# Patient Record
Sex: Female | Born: 1945 | Race: White | Hispanic: No | Marital: Married | Smoking: Never smoker
Health system: Southern US, Community
[De-identification: ages and names within clinical notes are randomized; demographics above are authoritative.]

## PROBLEM LIST (undated history)

## (undated) DIAGNOSIS — M1990 Unspecified osteoarthritis, unspecified site: Secondary | ICD-10-CM

## (undated) DIAGNOSIS — L6612 Frontal fibrosing alopecia: Secondary | ICD-10-CM

## (undated) DIAGNOSIS — Z9889 Other specified postprocedural states: Secondary | ICD-10-CM

## (undated) DIAGNOSIS — L4050 Arthropathic psoriasis, unspecified: Secondary | ICD-10-CM

## (undated) DIAGNOSIS — A048 Other specified bacterial intestinal infections: Secondary | ICD-10-CM

## (undated) DIAGNOSIS — M3500 Sicca syndrome, unspecified: Secondary | ICD-10-CM

## (undated) DIAGNOSIS — K219 Gastro-esophageal reflux disease without esophagitis: Secondary | ICD-10-CM

## (undated) DIAGNOSIS — N3010 Interstitial cystitis (chronic) without hematuria: Secondary | ICD-10-CM

## (undated) DIAGNOSIS — T7840XA Allergy, unspecified, initial encounter: Secondary | ICD-10-CM

## (undated) DIAGNOSIS — K5900 Constipation, unspecified: Secondary | ICD-10-CM

## (undated) DIAGNOSIS — R768 Other specified abnormal immunological findings in serum: Secondary | ICD-10-CM

## (undated) DIAGNOSIS — T8859XA Other complications of anesthesia, initial encounter: Secondary | ICD-10-CM

## (undated) DIAGNOSIS — K449 Diaphragmatic hernia without obstruction or gangrene: Secondary | ICD-10-CM

## (undated) DIAGNOSIS — T4145XA Adverse effect of unspecified anesthetic, initial encounter: Secondary | ICD-10-CM

## (undated) DIAGNOSIS — L409 Psoriasis, unspecified: Secondary | ICD-10-CM

## (undated) DIAGNOSIS — L661 Lichen planopilaris: Secondary | ICD-10-CM

## (undated) DIAGNOSIS — M797 Fibromyalgia: Secondary | ICD-10-CM

## (undated) DIAGNOSIS — R011 Cardiac murmur, unspecified: Secondary | ICD-10-CM

## (undated) DIAGNOSIS — R112 Nausea with vomiting, unspecified: Secondary | ICD-10-CM

## (undated) HISTORY — DX: Other specified bacterial intestinal infections: A04.8

## (undated) HISTORY — DX: Lichen planopilaris: L66.1

## (undated) HISTORY — DX: Unspecified osteoarthritis, unspecified site: M19.90

## (undated) HISTORY — PX: BLADDER SURGERY: SHX569

## (undated) HISTORY — DX: Fibromyalgia: M79.7

## (undated) HISTORY — PX: TUBAL LIGATION: SHX77

## (undated) HISTORY — DX: Allergy, unspecified, initial encounter: T78.40XA

## (undated) HISTORY — DX: Sjogren syndrome, unspecified: M35.00

## (undated) HISTORY — PX: OTHER SURGICAL HISTORY: SHX169

## (undated) HISTORY — DX: Gastro-esophageal reflux disease without esophagitis: K21.9

## (undated) HISTORY — DX: Frontal fibrosing alopecia: L66.12

## (undated) HISTORY — DX: Interstitial cystitis (chronic) without hematuria: N30.10

## (undated) HISTORY — DX: Other specified abnormal immunological findings in serum: R76.8

## (undated) HISTORY — DX: Diaphragmatic hernia without obstruction or gangrene: K44.9

---

## 1982-11-05 HISTORY — PX: OTHER SURGICAL HISTORY: SHX169

## 1993-11-05 HISTORY — PX: CHOLECYSTECTOMY: SHX55

## 1999-05-24 ENCOUNTER — Other Ambulatory Visit: Admission: RE | Admit: 1999-05-24 | Discharge: 1999-05-24 | Payer: Self-pay | Admitting: *Deleted

## 1999-11-03 ENCOUNTER — Other Ambulatory Visit: Admission: RE | Admit: 1999-11-03 | Discharge: 1999-11-03 | Payer: Self-pay | Admitting: *Deleted

## 1999-11-03 ENCOUNTER — Encounter (INDEPENDENT_AMBULATORY_CARE_PROVIDER_SITE_OTHER): Payer: Self-pay | Admitting: Specialist

## 2000-05-15 ENCOUNTER — Other Ambulatory Visit: Admission: RE | Admit: 2000-05-15 | Discharge: 2000-05-15 | Payer: Self-pay | Admitting: *Deleted

## 2000-09-02 ENCOUNTER — Ambulatory Visit (HOSPITAL_COMMUNITY): Admission: RE | Admit: 2000-09-02 | Discharge: 2000-09-02 | Payer: Self-pay | Admitting: *Deleted

## 2001-04-15 ENCOUNTER — Other Ambulatory Visit: Admission: RE | Admit: 2001-04-15 | Discharge: 2001-04-15 | Payer: Self-pay | Admitting: *Deleted

## 2002-05-25 ENCOUNTER — Other Ambulatory Visit: Admission: RE | Admit: 2002-05-25 | Discharge: 2002-05-25 | Payer: Self-pay | Admitting: *Deleted

## 2002-06-30 ENCOUNTER — Encounter: Payer: Self-pay | Admitting: Otolaryngology

## 2002-06-30 ENCOUNTER — Encounter: Admission: RE | Admit: 2002-06-30 | Discharge: 2002-06-30 | Payer: Self-pay | Admitting: Otolaryngology

## 2003-06-23 ENCOUNTER — Other Ambulatory Visit: Admission: RE | Admit: 2003-06-23 | Discharge: 2003-06-23 | Payer: Self-pay | Admitting: *Deleted

## 2003-07-29 ENCOUNTER — Ambulatory Visit (HOSPITAL_COMMUNITY): Admission: RE | Admit: 2003-07-29 | Discharge: 2003-07-29 | Payer: Self-pay | Admitting: Urology

## 2003-07-29 ENCOUNTER — Ambulatory Visit (HOSPITAL_BASED_OUTPATIENT_CLINIC_OR_DEPARTMENT_OTHER): Admission: RE | Admit: 2003-07-29 | Discharge: 2003-07-29 | Payer: Self-pay | Admitting: Urology

## 2003-07-29 ENCOUNTER — Encounter (INDEPENDENT_AMBULATORY_CARE_PROVIDER_SITE_OTHER): Payer: Self-pay | Admitting: Specialist

## 2003-11-06 ENCOUNTER — Encounter (INDEPENDENT_AMBULATORY_CARE_PROVIDER_SITE_OTHER): Payer: Self-pay | Admitting: *Deleted

## 2003-12-27 ENCOUNTER — Encounter: Admission: RE | Admit: 2003-12-27 | Discharge: 2003-12-27 | Payer: Self-pay | Admitting: Internal Medicine

## 2004-08-07 ENCOUNTER — Other Ambulatory Visit: Admission: RE | Admit: 2004-08-07 | Discharge: 2004-08-07 | Payer: Self-pay | Admitting: *Deleted

## 2004-12-07 ENCOUNTER — Ambulatory Visit: Payer: Self-pay | Admitting: Internal Medicine

## 2005-06-30 ENCOUNTER — Encounter: Admission: RE | Admit: 2005-06-30 | Discharge: 2005-06-30 | Payer: Self-pay | Admitting: Rheumatology

## 2005-08-20 ENCOUNTER — Other Ambulatory Visit: Admission: RE | Admit: 2005-08-20 | Discharge: 2005-08-20 | Payer: Self-pay | Admitting: *Deleted

## 2005-10-25 ENCOUNTER — Ambulatory Visit (HOSPITAL_COMMUNITY): Admission: RE | Admit: 2005-10-25 | Discharge: 2005-10-25 | Payer: Self-pay | Admitting: Urology

## 2005-10-25 ENCOUNTER — Ambulatory Visit (HOSPITAL_BASED_OUTPATIENT_CLINIC_OR_DEPARTMENT_OTHER): Admission: RE | Admit: 2005-10-25 | Discharge: 2005-10-25 | Payer: Self-pay | Admitting: Urology

## 2006-04-11 ENCOUNTER — Encounter: Admission: RE | Admit: 2006-04-11 | Discharge: 2006-04-11 | Payer: Self-pay | Admitting: Orthopedic Surgery

## 2006-10-17 ENCOUNTER — Other Ambulatory Visit: Admission: RE | Admit: 2006-10-17 | Discharge: 2006-10-17 | Payer: Self-pay | Admitting: *Deleted

## 2007-02-18 ENCOUNTER — Ambulatory Visit: Payer: Self-pay | Admitting: Internal Medicine

## 2007-02-24 ENCOUNTER — Ambulatory Visit: Payer: Self-pay | Admitting: Internal Medicine

## 2007-02-24 LAB — CONVERTED CEMR LAB
ALT: 29 units/L (ref 0–40)
AST: 25 units/L (ref 0–37)
BUN: 12 mg/dL (ref 6–23)
Basophils Absolute: 0.1 10*3/uL (ref 0.0–0.1)
Basophils Relative: 0.8 % (ref 0.0–1.0)
Creatinine, Ser: 0.9 mg/dL (ref 0.4–1.2)
Eosinophils Absolute: 0.4 10*3/uL (ref 0.0–0.6)
Eosinophils Relative: 6.1 % — ABNORMAL HIGH (ref 0.0–5.0)
HCT: 37.4 % (ref 36.0–46.0)
Hemoglobin: 12.9 g/dL (ref 12.0–15.0)
Hgb A1c MFr Bld: 5.3 % (ref 4.6–6.0)
Lymphocytes Relative: 24.6 % (ref 12.0–46.0)
MCHC: 34.5 g/dL (ref 30.0–36.0)
MCV: 92 fL (ref 78.0–100.0)
Monocytes Absolute: 0.3 10*3/uL (ref 0.2–0.7)
Monocytes Relative: 4.7 % (ref 3.0–11.0)
Neutro Abs: 4 10*3/uL (ref 1.4–7.7)
Neutrophils Relative %: 63.8 % (ref 43.0–77.0)
Platelets: 223 10*3/uL (ref 150–400)
RBC: 4.07 M/uL (ref 3.87–5.11)
RDW: 12.5 % (ref 11.5–14.6)
TSH: 1.36 microintl units/mL (ref 0.35–5.50)
WBC: 6.4 10*3/uL (ref 4.5–10.5)

## 2007-02-25 ENCOUNTER — Encounter: Payer: Self-pay | Admitting: Internal Medicine

## 2007-03-12 ENCOUNTER — Ambulatory Visit: Payer: Self-pay | Admitting: Internal Medicine

## 2007-03-12 ENCOUNTER — Encounter: Payer: Self-pay | Admitting: Internal Medicine

## 2007-04-18 ENCOUNTER — Encounter: Payer: Self-pay | Admitting: Internal Medicine

## 2007-06-23 ENCOUNTER — Encounter: Payer: Self-pay | Admitting: Internal Medicine

## 2007-06-24 ENCOUNTER — Encounter: Payer: Self-pay | Admitting: Internal Medicine

## 2007-10-10 ENCOUNTER — Ambulatory Visit: Payer: Self-pay | Admitting: Internal Medicine

## 2007-10-10 DIAGNOSIS — R635 Abnormal weight gain: Secondary | ICD-10-CM | POA: Insufficient documentation

## 2007-10-10 DIAGNOSIS — E039 Hypothyroidism, unspecified: Secondary | ICD-10-CM | POA: Insufficient documentation

## 2007-10-10 DIAGNOSIS — M2550 Pain in unspecified joint: Secondary | ICD-10-CM | POA: Insufficient documentation

## 2007-10-14 ENCOUNTER — Encounter (INDEPENDENT_AMBULATORY_CARE_PROVIDER_SITE_OTHER): Payer: Self-pay | Admitting: *Deleted

## 2007-10-14 DIAGNOSIS — J309 Allergic rhinitis, unspecified: Secondary | ICD-10-CM | POA: Insufficient documentation

## 2007-10-14 DIAGNOSIS — K449 Diaphragmatic hernia without obstruction or gangrene: Secondary | ICD-10-CM | POA: Insufficient documentation

## 2007-10-14 DIAGNOSIS — R799 Abnormal finding of blood chemistry, unspecified: Secondary | ICD-10-CM | POA: Insufficient documentation

## 2007-10-14 DIAGNOSIS — A048 Other specified bacterial intestinal infections: Secondary | ICD-10-CM | POA: Insufficient documentation

## 2007-10-15 LAB — CONVERTED CEMR LAB
Rheumatoid fact SerPl-aCnc: <20 intl units/mL (ref 0–20)
Sed Rate: 6 mm/hr (ref 0–22)
TSH: 1.711 microintl units/mL (ref 0.350–5.50)
Total CK: 77 units/L (ref 7–177)
Uric Acid, Serum: 3.9 mg/dL (ref 2.4–7.0)
Vit D, 1,25-Dihydroxy: 39 (ref 30–89)

## 2007-10-23 ENCOUNTER — Telehealth: Payer: Self-pay | Admitting: Internal Medicine

## 2007-10-27 ENCOUNTER — Telehealth (INDEPENDENT_AMBULATORY_CARE_PROVIDER_SITE_OTHER): Payer: Self-pay | Admitting: *Deleted

## 2007-11-24 ENCOUNTER — Emergency Department (HOSPITAL_COMMUNITY): Admission: EM | Admit: 2007-11-24 | Discharge: 2007-11-24 | Payer: Self-pay | Admitting: Emergency Medicine

## 2007-12-03 ENCOUNTER — Other Ambulatory Visit: Admission: RE | Admit: 2007-12-03 | Discharge: 2007-12-03 | Payer: Self-pay | Admitting: *Deleted

## 2008-01-06 ENCOUNTER — Encounter: Payer: Self-pay | Admitting: Internal Medicine

## 2008-02-03 ENCOUNTER — Encounter: Payer: Self-pay | Admitting: Internal Medicine

## 2008-03-16 ENCOUNTER — Telehealth (INDEPENDENT_AMBULATORY_CARE_PROVIDER_SITE_OTHER): Payer: Self-pay | Admitting: *Deleted

## 2008-04-30 ENCOUNTER — Encounter: Payer: Self-pay | Admitting: Internal Medicine

## 2008-06-23 ENCOUNTER — Encounter: Payer: Self-pay | Admitting: Internal Medicine

## 2008-09-04 ENCOUNTER — Ambulatory Visit: Payer: Self-pay | Admitting: Family Medicine

## 2008-09-04 DIAGNOSIS — G43909 Migraine, unspecified, not intractable, without status migrainosus: Secondary | ICD-10-CM | POA: Insufficient documentation

## 2008-09-21 ENCOUNTER — Ambulatory Visit: Payer: Self-pay | Admitting: Internal Medicine

## 2008-10-11 ENCOUNTER — Encounter: Payer: Self-pay | Admitting: Internal Medicine

## 2008-11-23 ENCOUNTER — Encounter: Payer: Self-pay | Admitting: Internal Medicine

## 2008-12-09 ENCOUNTER — Other Ambulatory Visit: Admission: RE | Admit: 2008-12-09 | Discharge: 2008-12-09 | Payer: Self-pay | Admitting: Obstetrics and Gynecology

## 2009-01-11 ENCOUNTER — Encounter: Payer: Self-pay | Admitting: Internal Medicine

## 2009-01-24 ENCOUNTER — Ambulatory Visit: Payer: Self-pay | Admitting: Internal Medicine

## 2009-01-24 DIAGNOSIS — F32A Depression, unspecified: Secondary | ICD-10-CM | POA: Insufficient documentation

## 2009-01-24 DIAGNOSIS — F329 Major depressive disorder, single episode, unspecified: Secondary | ICD-10-CM

## 2009-02-01 ENCOUNTER — Encounter (INDEPENDENT_AMBULATORY_CARE_PROVIDER_SITE_OTHER): Payer: Self-pay | Admitting: *Deleted

## 2009-02-01 LAB — CONVERTED CEMR LAB: TSH: 0.94 microintl units/mL (ref 0.35–5.50)

## 2009-03-30 ENCOUNTER — Encounter: Payer: Self-pay | Admitting: Internal Medicine

## 2009-07-22 ENCOUNTER — Telehealth: Payer: Self-pay | Admitting: Internal Medicine

## 2009-07-25 ENCOUNTER — Ambulatory Visit: Payer: Self-pay | Admitting: Internal Medicine

## 2009-07-25 LAB — CONVERTED CEMR LAB
Cholesterol: 183 mg/dL (ref 0–200)
HDL: 52.9 mg/dL (ref >39.00)
TSH: 1.21 microintl units/mL (ref 0.35–5.50)
Triglycerides: 88 mg/dL (ref 0.0–149.0)
VLDL: 17.6 mg/dL (ref 0.0–40.0)

## 2009-08-04 ENCOUNTER — Telehealth (INDEPENDENT_AMBULATORY_CARE_PROVIDER_SITE_OTHER): Payer: Self-pay | Admitting: *Deleted

## 2009-08-16 ENCOUNTER — Telehealth (INDEPENDENT_AMBULATORY_CARE_PROVIDER_SITE_OTHER): Payer: Self-pay | Admitting: *Deleted

## 2009-09-01 ENCOUNTER — Telehealth: Payer: Self-pay | Admitting: Internal Medicine

## 2009-12-12 ENCOUNTER — Other Ambulatory Visit: Admission: RE | Admit: 2009-12-12 | Discharge: 2009-12-12 | Payer: Self-pay | Admitting: Obstetrics and Gynecology

## 2010-01-09 ENCOUNTER — Encounter: Payer: Self-pay | Admitting: Internal Medicine

## 2010-01-12 ENCOUNTER — Encounter: Payer: Self-pay | Admitting: Internal Medicine

## 2010-01-25 ENCOUNTER — Telehealth (INDEPENDENT_AMBULATORY_CARE_PROVIDER_SITE_OTHER): Payer: Self-pay | Admitting: *Deleted

## 2010-01-26 ENCOUNTER — Ambulatory Visit: Payer: Self-pay | Admitting: Internal Medicine

## 2010-01-26 LAB — CONVERTED CEMR LAB
HDL: 43.2 mg/dL (ref >39.00)
LDL Cholesterol: 101 mg/dL — ABNORMAL HIGH (ref 0–99)
Total CHOL/HDL Ratio: 4
Triglycerides: 120 mg/dL (ref 0.0–149.0)
VLDL: 24 mg/dL (ref 0.0–40.0)

## 2010-01-31 ENCOUNTER — Ambulatory Visit: Payer: Self-pay | Admitting: Internal Medicine

## 2010-01-31 DIAGNOSIS — R350 Frequency of micturition: Secondary | ICD-10-CM | POA: Insufficient documentation

## 2010-01-31 DIAGNOSIS — N3010 Interstitial cystitis (chronic) without hematuria: Secondary | ICD-10-CM | POA: Insufficient documentation

## 2010-01-31 DIAGNOSIS — R0609 Other forms of dyspnea: Secondary | ICD-10-CM | POA: Insufficient documentation

## 2010-01-31 DIAGNOSIS — R5381 Other malaise: Secondary | ICD-10-CM | POA: Insufficient documentation

## 2010-01-31 DIAGNOSIS — R5383 Other fatigue: Secondary | ICD-10-CM | POA: Insufficient documentation

## 2010-01-31 DIAGNOSIS — M797 Fibromyalgia: Secondary | ICD-10-CM | POA: Insufficient documentation

## 2010-01-31 DIAGNOSIS — R0989 Other specified symptoms and signs involving the circulatory and respiratory systems: Secondary | ICD-10-CM | POA: Insufficient documentation

## 2010-02-01 LAB — CONVERTED CEMR LAB
Alkaline Phosphatase: 76 units/L (ref 39–117)
Basophils Relative: 0.8 % (ref 0.0–3.0)
Bilirubin, Direct: 0 mg/dL (ref 0.0–0.3)
CO2: 29 meq/L (ref 19–32)
Calcium: 9.3 mg/dL (ref 8.4–10.5)
Creatinine, Ser: 0.8 mg/dL (ref 0.4–1.2)
Eosinophils Absolute: 0.5 10*3/uL (ref 0.0–0.7)
GFR calc non Af Amer: 76.92 mL/min (ref >60)
Lymphocytes Relative: 21.2 % (ref 12.0–46.0)
MCHC: 33.3 g/dL (ref 30.0–36.0)
Monocytes Relative: 6.7 % (ref 3.0–12.0)
Neutrophils Relative %: 63 % (ref 43.0–77.0)
RBC: 4.25 M/uL (ref 3.87–5.11)
Total Protein: 6.7 g/dL (ref 6.0–8.3)
WBC: 6 10*3/uL (ref 4.5–10.5)

## 2010-06-13 ENCOUNTER — Encounter: Payer: Self-pay | Admitting: Internal Medicine

## 2010-10-16 ENCOUNTER — Telehealth (INDEPENDENT_AMBULATORY_CARE_PROVIDER_SITE_OTHER): Payer: Self-pay | Admitting: *Deleted

## 2010-10-18 ENCOUNTER — Ambulatory Visit: Payer: Self-pay | Admitting: Internal Medicine

## 2010-10-20 LAB — CONVERTED CEMR LAB
ALT: 36 units/L — ABNORMAL HIGH (ref 0–35)
AST: 25 units/L (ref 0–37)
Bilirubin, Direct: 0.1 mg/dL (ref 0.0–0.3)
Total Bilirubin: 0.4 mg/dL (ref 0.3–1.2)
Total Protein: 6 g/dL (ref 6.0–8.3)

## 2010-11-14 ENCOUNTER — Encounter: Payer: Self-pay | Admitting: Internal Medicine

## 2010-12-07 NOTE — Letter (Signed)
Summary: [facility]  [facility]   Imported By: Lanelle Bal 06/22/2010 10:51:01  _____________________________________________________________________  External Attachment:    Type:   Image     Comment:   External Document

## 2010-12-07 NOTE — Letter (Signed)
Summary: [facility]  [facility]   Imported By: Lanelle Bal 11/23/2010 13:17:04  _____________________________________________________________________  External Attachment:    Type:   Image     Comment:   External Document

## 2010-12-07 NOTE — Letter (Signed)
Summary: [facility]  [facility]   Imported By: Lanelle Bal 01/23/2010 11:42:57  _____________________________________________________________________  External Attachment:    Type:   Image     Comment:   External Document

## 2010-12-07 NOTE — Progress Notes (Signed)
Summary:  med increase  Phone Note Call from Patient Call back at Work Phone [phone]   Caller: Patient Summary of Call: pt left VM that she feels that her thyroid med needs to be increase. pt c/o fatigue, hair loss, dry skin, and unable to loss weight............Marland KitchenFelecia Deloach CMA  January 25, 2010 12:50 PM   left message to call office. per last labs and phone note pt due for OV to review labs and rx meds for a year. pt never f/u with appt or has any pending appt schedule. pt needs to have OV and labs (TSH) repeated.............Marland KitchenFelecia Deloach CMA  January 25, 2010 12:54 PM   Follow-up for Phone Call        pt has labs and ov schedule.............Marland KitchenFelecia Deloach CMA  January 25, 2010 4:17 PM

## 2010-12-07 NOTE — Letter (Signed)
Summary: [facility]  [facility]   Imported By: Lanelle Bal 01/24/2010 13:04:12  _____________________________________________________________________  External Attachment:    Type:   Image     Comment:   External Document

## 2010-12-07 NOTE — Assessment & Plan Note (Signed)
Summary: f/u labs, discuss increase med//fd   Vital Signs:  Patient profile:   65 year old female Weight:      176.2 pounds BMI:     29.89 Pulse rate:   80 / minute Resp:     16 per minute BP sitting:   124 / 78  (left arm) Cuff size:   large  Vitals Entered By: Shonna Chock (January 31, 2010 8:08 AM) CC: Follow-up visit: Discuss labs (copy given), Fatigue Comments REVIEWED MED LIST, PATIENT AGREED DOSE AND INSTRUCTION CORRECT    Primary Care Provider:  Alwyn Ren  CC:  Follow-up visit: Discuss labs (copy given) and Fatigue.  History of Present Illness: In Weight Watchers & exercising 6X /week (CURVES & walking) with minimal weight loss.The patient reports persistent fatigue over 12-18 months,a  fatigue without physical limitations, and primarily physical fatigue.  The patient also reports dyspnea in CURVES with heart rate of to 130.  The patient denies fever, night sweats, exertional chest pain, cough, hemoptysis, and new medications.  Other symptoms include skin changes as dryness & hair loss.  The patient denies the following symptoms: leg swelling, orthopnea, PND, melena, adenopathy, severe snoring, and daytime sleepiness.  Depressive symptoms include anhedonia and feeling depressed.  The patient denies altered appetite and poor sleep.  She has Fibromyalgia & diffuse arthralgias treated by Dr Corliss Skains. These prompted her to retire. She previously took Cymbalta but she couln't urinate. Labs reviewed : TSH therapeutic & stable over 3 years.Dr Thomasena Edis checked vitamin D level.  Preventive Screening-Counseling & Management  Alcohol-Tobacco     Smoking Status: never  Allergies: 1)  ! Pcn 2)  ! * Emycin 3)  ! Keflex 4)  ! 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), S/P treatment ANA POSITIVE (ICD-790.99) HIATAL HERNIA (ICD-553.3) ALLERGIC RHINITIS CAUSE UNSPECIFIED (ICD-477.9) FIBROMYALGIA (ICD-729.1) HYPOTHYROIDISM  (ICD-244.9) WEIGHT GAIN (ICD-783.1) PAIN IN JOINT, SITE UNSPECIFIED (ICD-719.40) Interstitial Cystitis  Past Surgical History: Bladder surgery x2 for scar tissue cysts on ovaries x2 Polyp removed from uterus Early Menopause @ 41 G(vi) P(iii) Mis(iii) Colonscopy 1610,9604 negative(FH colon CA) Endoscopy 2001--HH,ERD  Family History: Father: DM, MI,  Alcoholism, died at age 71 Mother: colon cancer, Alzheimers,  depression, hypothyroidism Sibling:none Maternal Uncles: DM, alcoholism Maternal GM-HTN, CVA Maternal FH:-depression Son: drug overdose  Social History: Retired Never Smoked Alcohol use-yes: rarely  Review of Systems Eyes:  Denies blurring, double vision, and vision loss-both eyes. ENT:  Denies difficulty swallowing and hoarseness. CV:  Denies palpitations. GI:  Complains of constipation; denies diarrhea. GU:  Complains of urinary frequency; Dr Marcello Fennel treating IC. Derm:  Complains of changes in nail beds; Nails brittle. Neuro:  Complains of numbness; denies tingling; RUE CTS treated with brace. [place] tremor RUE. Psych:  Complains of depression; denies anxiety, easily angered, easily tearful, and irritability; Wellbutrin ? effective. Endo:  Complains of cold intolerance; denies excessive hunger and excessive thirst.  Physical Exam  General:  well-nourished; alert,appropriate and cooperative throughout examination Neck:  No deformities, masses, or tenderness noted. Lungs:  Normal respiratory effort, chest expands symmetrically. Lungs are clear to auscultation, no crackles or wheezes. Heart:  normal rate, regular rhythm, no gallop, no rub, no JVD, no HJR, S4 with  grade 1 /6 systolic murmur.     Impression & Recommendations:  Problem # 1:  FATIGUE (ICD-780.79)  Orders: Venipuncture (54098) TLB-BMP (Basic Metabolic Panel-BMET) (80048-METABOL) TLB-CBC Platelet - w/Differential (85025-CBCD) TLB-Hepatic/Liver Function Pnl (80076-HEPATIC) EKG w/ Interpretation  (  93000)  Problem # 2:  DYSPNEA/SHORTNESS OF BREATH (ICD-786.09)  Orders: EKG w/ Interpretation (93000)  Problem # 3:  FIBROMYALGIA (ICD-729.1)  Her updated medication list for this problem includes:    Naprelan 375 Mg Xr24h-tab (Naproxen sodium)    Flexeril 10 Mg Tabs (Cyclobenzaprine hcl) .Marland Kitchen... Take 1/2 -1 tab three times a day as needed only, "may affect balance , level of alertness"  Problem # 4:  INTERSTITIAL CYSTITIS (ICD-595.1)  Problem # 5:  FREQUENCY, URINARY (ICD-788.41)  Orders: Venipuncture (16109) TLB-A1C / Hgb A1C (Glycohemoglobin) (83036-A1C)  Problem # 6:  HYPOTHYROIDISM (ICD-244.9) TSH therapeutic Her updated medication list for this problem includes:    Synthroid 50 Mcg Tabs (Levothyroxine sodium) .Marland Kitchen... 1 by mouth qd  Complete Medication List: 1)  Synthroid 50 Mcg Tabs (Levothyroxine sodium) .Marland Kitchen.. 1 by mouth qd 2)  Nexium 40 Mg Cpdr (Esomeprazole magnesium) .Marland Kitchen.. 1 by mouth once daily 3)  Zyrtec Allergy 10 Mg Tabs (Cetirizine hcl) .Marland Kitchen.. 1 by mouth qd 4)  Asa  .... Prn 5)  Ibuprofen Tabs (ibuprofen)  .... Prn 6)  Benedyl  .... Prn 7)  Claritin  .... Prn 8)  Multivitamin  .... Prn 9)  Calcium  .... Prn 10)  Glucosamine and Chondriotin  .... Prn 11)  Fish Oil  .... Prn 12)  Naprelan 375 Mg Xr24h-tab (Naproxen sodium) 13)  Flexeril 10 Mg Tabs (Cyclobenzaprine hcl) .... Take 1/2 -1 tab three times a day as needed only, "may affect balance , level of alertness" 14)  Wellbutrin Xl 300 Mg Xr24h-tab (bupropion Hcl)  .... Take one tablet daily  Patient Instructions: 1)  If Wellbutrin XL 300 mg is not effective ; please discuss Savella with Dr Corliss Skains. Prescriptions: SYNTHROID 50 MCG  TABS (LEVOTHYROXINE SODIUM) 1 by mouth qd  #90 Tablet x 3   Entered and Authorized by:   Marga Melnick MD   Signed by:   Marga Melnick MD on 01/31/2010   Method used:   Print then Give to Patient   RxID:   [id number] WELLBUTRIN XL 300  MG XR24H-TAB (BUPROPION HCL) take one  tablet daily  #90 x 1   Entered and Authorized by:   Marga Melnick MD   Signed by:   Marga Melnick MD on 01/31/2010   Method used:   Print then Give to Patient   RxID:   [id number]

## 2010-12-07 NOTE — Progress Notes (Signed)
Summary: needs lab appt at [facility] lab   (lmom  12/12)  Phone Note Call from Patient Call back at Work Phone [phone]   Caller: Patient Summary of Call: patient called to set up labwork at [facility]----per results in March 2011, she needs just one repeat lab-------    Hepatic  790.4  she didnt answer phone when I called back, so I left message Initial call taken by: Jerolyn Shin,  October 16, 2010 2:56 PM  Follow-up for Phone Call        she called back--fasting lab was entered for Wed 12/14 at [facility] lab Follow-up by: Jerolyn Shin,  October 17, 2010 8:59 AM

## 2011-02-27 ENCOUNTER — Other Ambulatory Visit: Payer: Self-pay | Admitting: Internal Medicine

## 2011-02-27 NOTE — Telephone Encounter (Signed)
Patient needs cpx

## 2011-03-23 NOTE — Op Note (Signed)
NAME:  Susan Carpenter, Susan Carpenter                ACCOUNT NO.:  [id number]   MEDICAL RECORD NO.:  [id number]          PATIENT TYPE:  AMB   LOCATION:  [facility]                         FACILITY:  [facility]   PHYSICIAN:  Sigmund I. Patsi Sears, M.D.DATE OF BIRTH:  [birth year]   DATE OF PROCEDURE:  10/25/2005  DATE OF DISCHARGE:                                 OPERATIVE REPORT   PREOPERATIVE DIAGNOSIS:  Interstitial cystitis.   POSTOPERATIVE DIAGNOSIS:  Interstitial cystitis.   OPERATION:  1.  Cystourethroscopy.  2.  Hydrodistention in the bladder (900 mL).  3.  Instillation of Clorpactin for 3 minutes with irrigation.  4.  Installation of Pyridium, Marcaine in the bladder, injection of Marcaine      and Kenalog in the subtrigonal space.   SURGEON:  Dr. Patsi Sears   ANESTHESIA:  General LMA.   PREPARATION:  After appropriate preanesthesia, the patient is brought to the  operating room, placed on the operating table in the dorsal supine position  where general LMA anesthesia was introduced.  She was then re-placed in  dorsal lithotomy position where the pubis was prepped with Betadine solution  and draped in the usual fashion.  B&O suppository was given.   Cystourethroscopy was accomplished, and 900-950 mL and following this, the  bladder was drained of fluid.  Clorpactin instillation was accomplished for  3 minutes; then irrigation with saline was accomplished.  Following this,  Pyridium, Marcaine solution was instilled in the bladder and left in place.  Marcaine and Kenalog was injected at the bladder base.  The patient was  given IV Toradol, awakened, and taken to recovery room in good condition.      Sigmund I. Patsi Sears, M.D.  Electronically Signed     SIT/MEDQ  D:  10/25/2005  T:  10/29/2005  Job:  [id number]

## 2011-03-23 NOTE — Assessment & Plan Note (Signed)
[facility]                        [place] OFFICE NOTE   SILVERIA, BOTZ                       MRN:          [id number]  DATE:02/18/2007                            DOB:          08-13-1946    Genia Harold, date of birth February 10, 1946, was seen February 18, 2007, with complaints of fatigue and polydipsia present for at least  three months.  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  She  has seen the ophthalmologist at [facility], and it was  recommended that she consider having tear duct surgery.   Additionally, she describes cold intolerance requiring extra bedclothes  and covers.  There is chronic constipation, which she controls with  fiber.  She has chronic dry skin and hair changes.   She describes frequency of 12 to 15 times a day secondary to  interstitial cystitis.   She has lost 7 pounds in 10 weeks despite being on a meticulous Weight  Watcher Diet  and exercising at a high level 4 to 5 times per week.   She is able to exercise despite chronic inflammation of the second left  metatarsal for which she has ahd orthotic intervention.   Significant history includes colonoscopy in December of 2007, which was  negative.   Her mother did have colon cancer as well as diabetes and hypothyroidism.  Her father was diabetic and had a heart attack at 24.  Maternal uncles  have had diabetes.   She drinks socially but has never smoked.   PRESENT MEDICATIONS:  1. Elmiron 200 mg daily.  2. Synthroid 60 mcg daily.  3. Nexium 40 mg daily.  4. Wellbutrin XL 300.  5. Vivelle-Dot 0.05 mg daily.  6. Aygestin 5 mg every other month for 14 days.  7. Zyrtec 10 mg daily.  8. Relafen as needed.  9. She is on calcium, Glucosamine-Chondroitin combination,      multivitamins, and p.r.n. medicines.   She describes excess somnolence as well but has not had motor vehicle  accidents related to somnolence & does  not fall asleep during  conversations.  There is no history of sleep apnea.   Her weight is approximately the same as it was when last seen in August  2006 at 162 fully clothed.  Pulse was 82, heart rate 60, and blood  pressure 126/78.  Thyroid is normal to palpation.  The tongue is moist.  She has no lymphadenopathy  of neck or axilla.  She has no significant murmurs or gallops.  She has no organomegaly or  masses.  Pulses are intact.  Her hands do reveal significant degenerative joint changes with  osteophytes distally.  The skin is warm and dry with fair turgor.   Review of the chart reveals that she has had LDL in the range of 113 to  116 with otherwise normal lipid panels.   Because of the symptoms of fatigue, a CBC and differential and complete  TFTs would be appropriate.  Stool cards are not necessary as she had a  negative colonoscopy in September of 2007.   Because of the  history of polydipsia as well as the family history of  diabetes, an A1c would be appropriate.   An NMR LipoProfile would optimally evaluate the positive family  cardiovascular history & mild elevation of LDL.   I will ask her to consult the prevention.com web site for the [employer] Diet. I explained that unfortunately, when women go through  menopause, their metabolism does slow and  despite maintaining the same  caloric intake, they have profound difficulty losing weight.   I also would recommend that she consider reevaluation by a  rheumatologist because of the xerostomia and xerophthalmia symptoms she  has.  She has been followed by Dr. Santiago Bumpers, podiatrist.  In  January of this year, he performed uric acid, sed rate, ANA, rheumatoid  factor, and C-reactive protein, all of which were normal.  Significantly, she had a positive ANA in 1992.     Titus Dubin. Alwyn Ren, MD,FACP,FCCP  Electronically Signed    [facility]/MedQ  DD: 02/18/2007  DT: 02/18/2007  Job #: [id number]

## 2011-03-23 NOTE — Op Note (Signed)
   NAME:  Susan Carpenter, Susan Carpenter                          ACCOUNT NO.:  [id number]   MEDICAL RECORD NO.:  [id number]                   PATIENT TYPE:  AMB   LOCATION:  [facility]                                 FACILITY:  [facility]   PHYSICIAN:  Sigmund I. Patsi Sears, M.D.         DATE OF BIRTH:  [birth year]   DATE OF PROCEDURE:  07/29/2003  DATE OF DISCHARGE:                                 OPERATIVE REPORT   PREOPERATIVE DIAGNOSIS:  Interstitial cystitis.   POSTOPERATIVE DIAGNOSIS:  Interstitial cystitis.   OPERATION/PROCEDURE:  1. Cystourethroscopy.  2. Hydrodistention of the bladder.  3. Cold cup bladder biopsy.  4. Cauterization of biopsy sites.  5. Insertion of Marcaine and Pyridium.  6. Injection of Marcaine and Kenalog.   SURGEON:  Sigmund I. Patsi Sears, M.D.   ANESTHESIA:  LMA.   PREPARATION:  After the patient is brought to the operating room and placed  on the operating table in the dorsal supine position, general LMA anesthesia  was introduced.  She was then replaced in the dorsal lithotomy position  where the pubis was prepped with a Betadine solution and draped in the usual  fashion.   DESCRIPTION OF PROCEDURE:  Cystourethroscopy was accomplished and there was  no urethral stenosis noted.  Cystoscopy revealed normal appearing bladder  with no evidence of bladder stones, tumor, or diverticular formation.  Cold  cup bladder biopsies were taken and the areas cauterized.  The bladder was  previously hydrodistended to 950 mL.  Following this, photodocumentation was  accomplished of the patient's IC, and the bladder drained of fluid, and  Marcaine/Pyridium solution inserted in the bladder and Marcaine/Kenalog  solution injected into the bladder base.  The patient tolerated the  procedure well.  At the  beginning of the procedure, the patient had a B&O  suppository and at the end of the procedure, the patient had IV Toradol.  She was awakened and taken to the recovery room in good  condition.                                               Sigmund I. Patsi Sears, M.D.    SIT/MEDQ  D:  07/29/2003  T:  07/29/2003  Job:  [id number]

## 2011-06-18 ENCOUNTER — Other Ambulatory Visit: Payer: Self-pay | Admitting: Internal Medicine

## 2011-07-27 LAB — POCT I-STAT CREATININE
Creatinine, Ser: 1.1
Operator id: 198171

## 2011-07-27 LAB — URINALYSIS, ROUTINE W REFLEX MICROSCOPIC
Bilirubin Urine: NEGATIVE
Hgb urine dipstick: NEGATIVE
Ketones, ur: 15 — AB
Nitrite: NEGATIVE
Urobilinogen, UA: 0.2

## 2011-07-27 LAB — I-STAT 8, (EC8 V) (CONVERTED LAB)
Bicarbonate: 23.7
HCT: 49 — ABNORMAL HIGH
Hemoglobin: 16.7 — ABNORMAL HIGH
Operator id: 198171
Sodium: 139
TCO2: 25
pCO2, Ven: 36.6 — ABNORMAL LOW

## 2011-07-27 LAB — CBC
Hemoglobin: 15.3 — ABNORMAL HIGH
Platelets: 280
RDW: 13.2

## 2011-07-27 LAB — DIFFERENTIAL
Basophils Absolute: 0
Lymphocytes Relative: 3 — ABNORMAL LOW
Monocytes Absolute: 0.5
Neutro Abs: 18.5 — ABNORMAL HIGH
Neutrophils Relative %: 94 — ABNORMAL HIGH

## 2011-08-07 ENCOUNTER — Ambulatory Visit (INDEPENDENT_AMBULATORY_CARE_PROVIDER_SITE_OTHER): Payer: BC Managed Care – PPO | Admitting: Internal Medicine

## 2011-08-07 ENCOUNTER — Encounter: Payer: Self-pay | Admitting: Internal Medicine

## 2011-08-07 DIAGNOSIS — R5383 Other fatigue: Secondary | ICD-10-CM

## 2011-08-07 DIAGNOSIS — F329 Major depressive disorder, single episode, unspecified: Secondary | ICD-10-CM

## 2011-08-07 DIAGNOSIS — E039 Hypothyroidism, unspecified: Secondary | ICD-10-CM

## 2011-08-07 DIAGNOSIS — F3289 Other specified depressive episodes: Secondary | ICD-10-CM

## 2011-08-07 LAB — CBC WITH DIFFERENTIAL/PLATELET
Basophils Absolute: 0 10*3/uL (ref 0.0–0.1)
Eosinophils Relative: 6.8 % — ABNORMAL HIGH (ref 0.0–5.0)
HCT: 41.8 % (ref 36.0–46.0)
Hemoglobin: 13.3 g/dL (ref 12.0–15.0)
Lymphocytes Relative: 25 % (ref 12.0–46.0)
Lymphs Abs: 2 10*3/uL (ref 0.7–4.0)
Monocytes Relative: 5.1 % (ref 3.0–12.0)
Neutro Abs: 4.9 10*3/uL (ref 1.4–7.7)
RBC: 4.43 Mil/uL (ref 3.87–5.11)
RDW: 13.8 % (ref 11.5–14.6)
WBC: 7.9 10*3/uL (ref 4.5–10.5)

## 2011-08-07 LAB — TSH: TSH: 1.18 u[IU]/mL (ref 0.35–5.50)

## 2011-08-07 MED ORDER — L-METHYLFOLATE 7.5 MG PO TABS: 7.5000 mg | ORAL_TABLET | Freq: Every day | ORAL | Status: DC

## 2011-08-07 NOTE — Progress Notes (Signed)
Subjective:    Patient ID: Susan Carpenter, female    DOB: 1946-06-27, 65 y.o.   MRN: [id number]  HPI Depression: Onset:2002 Anxiety:no Loss of interest (Anhedonia):major issue; "I'm in blah land" Panic attacks:no Insomnia:usually no Anorexia:no, appetite increased Fatigue:always , but in context of Interstitial Cystitis, OA, & Fibromyalgia Neurologic signs/symptoms: Headache, numbness and tingling, weakness:no Family history of mental health issues, alcoholism or drug abuse:M, M aunt , M uncle  &MGM had depression Medications/efficacy:she questions efficacy of Wellbutrin.  She takes Cymbalta but had difficulty with urination. She also has chronic urinary issues with the interstitial cystitis. She has never taken Savella.  Thyroid function monitor : Medications status(change in dose/brand/mode of administration):no Constitutional: Weight change: up 15 # but lost slowly  with Weight  Watchers X 3 Visual change(blurred/diplopia/visual loss):no Hoarseness:no pewrsistently; Swallowing issues:no Cardiovascular: Palpitations:no; Racing:no; Irregularity:no GI: Constipation:some constipation; Diarrhea:no Derm: Change in nails/hair/skin:hair thinning Endo: Temperature intolerance: Heat:no; Cold:no      Review of Systems     Objective:   Physical Exam Gen.: Healthy and well-nourished in appearance. Alert, appropriate and cooperative throughout exam. Intermittently joking & laughing Head: Normocephalic without obvious abnormalities;  no alopecia but hair slightly  coarse Eyes: No corneal or conjunctival inflammation noted. Pupils equal round reactive to light and accommodation.  Extraocular motion intact. No lid lag Neck: No deformities, masses, or tenderness noted. Range of motion slightly decreased laterally. Thyroid normal. Lungs: Normal respiratory effort; chest expands symmetrically. Lungs are clear to auscultation without rales, wheezes, or increased work of breathing. Heart: Normal  rate and rhythm. Normal S1 and S2. No gallop, click, or rub. S4 w/o  murmur. Abdomen: Bowel sounds normal; abdomen soft and nontender. No masses, organomegaly or hernias noted.                                                                                   Musculoskeletal/extremities: No deformity or scoliosis noted of  the thoracic or lumbar spine. No clubbing, cyanosis, edema  noted. Range of motion  normal .Tone & strength  Normal.Joints:mild OA DIP changes. Nail health good; no onycholysis. Vascular: Carotid, radial artery, dorsalis pedis and  posterior tibial pulses are full and equal. No bruits present. Neurologic: Alert and oriented x3. Deep tendon reflexes symmetrical and normal.No trmor          Skin: Intact without suspicious lesions or rashes. Lymph: No cervical, axillary  lymphadenopathy present. Psych: Mood and affect are normal. Normally interactive as noted                                                                                    Assessment & Plan:  #1 impression; major symptom is anhedonia. Past history of adverse effects with Cymbalta  #2 hypothyroidism, question status  #3 weight gain due to dietary noncompliance  Plan: #1 Wellbutrin would be the best drug  to treat anhedonia and would not be associated with weight gain. She does not describe significant anxiety and panic attacks and SSRIs might be associated with further weight. She can discuss possible use of Savella with Dr Corliss Skains.  A good nutritional program is paramount. Her dietary choices may be aggravating her present symptoms.Deplin  is also an option.

## 2011-08-07 NOTE — Patient Instructions (Signed)
Preventive Health Care: Exercise  30-45  minutes a day, 3-4 days a week. Walking is especially valuable in preventing Osteoporosis. Eat a low-fat diet with lots of fruits and vegetables, up to 7-9 servings per day. Avoid obesity; your goal is waist measurement < 35 inches.Consume less than 30 grams of sugar per day from foods & drinks with High Fructose Corn Sugar as #1,2,3 or # 4 on label. Follow the low carb nutrition program in [employer] as closely as possible to prevent Diabetes progression & complications. White carbohydrates (potatoes, rice, bread, and pasta) have a high spike of sugar and a high load of sugar. For example a  baked potato has a cup of sugar and a  french fry  2 teaspoons of sugar. Yams, wild  rice, whole grained bread &  wheat pasta have been much lower spike and load of  sugar. Portions should be the size of a deck of cards or your palm.

## 2011-11-07 ENCOUNTER — Other Ambulatory Visit: Payer: Self-pay | Admitting: Internal Medicine

## 2012-01-08 ENCOUNTER — Other Ambulatory Visit (HOSPITAL_COMMUNITY): Payer: Self-pay | Admitting: Gastroenterology

## 2012-01-08 DIAGNOSIS — R110 Nausea: Secondary | ICD-10-CM

## 2012-01-16 ENCOUNTER — Encounter (HOSPITAL_COMMUNITY)
Admission: RE | Admit: 2012-01-16 | Discharge: 2012-01-16 | Disposition: A | Discharge status: Home / Self Care | Payer: Medicare Other | Source: Ambulatory Visit | Attending: Gastroenterology | Admitting: Gastroenterology

## 2012-01-16 DIAGNOSIS — R109 Unspecified abdominal pain: Secondary | ICD-10-CM | POA: Insufficient documentation

## 2012-01-16 DIAGNOSIS — K449 Diaphragmatic hernia without obstruction or gangrene: Secondary | ICD-10-CM | POA: Insufficient documentation

## 2012-01-16 DIAGNOSIS — R110 Nausea: Secondary | ICD-10-CM | POA: Insufficient documentation

## 2012-01-16 MED ORDER — TECHNETIUM TC 99M SULFUR COLLOID
2.1000 | Freq: Once | INTRAVENOUS | Status: AC | PRN
  Administered 2012-01-16: 2.1 via INTRAVENOUS

## 2012-02-19 ENCOUNTER — Other Ambulatory Visit: Payer: Self-pay | Admitting: Internal Medicine

## 2012-06-19 ENCOUNTER — Ambulatory Visit (HOSPITAL_COMMUNITY)
Admission: RE | Admit: 2012-06-19 | Discharge: 2012-06-19 | Disposition: A | Discharge status: Home / Self Care | Payer: Medicare Other | Source: Ambulatory Visit | Attending: Rheumatology | Admitting: Rheumatology

## 2012-06-19 ENCOUNTER — Other Ambulatory Visit (HOSPITAL_COMMUNITY): Payer: Self-pay | Admitting: Rheumatology

## 2012-06-19 DIAGNOSIS — R059 Cough, unspecified: Secondary | ICD-10-CM | POA: Insufficient documentation

## 2012-06-19 DIAGNOSIS — R05 Cough: Secondary | ICD-10-CM

## 2012-06-19 DIAGNOSIS — R053 Chronic cough: Secondary | ICD-10-CM

## 2013-06-19 ENCOUNTER — Encounter: Payer: Self-pay | Admitting: Rheumatology

## 2013-09-17 ENCOUNTER — Other Ambulatory Visit: Payer: Self-pay | Admitting: Physician Assistant

## 2013-09-17 ENCOUNTER — Ambulatory Visit
Admission: RE | Admit: 2013-09-17 | Discharge: 2013-09-17 | Disposition: A | Discharge status: Home / Self Care | Payer: Medicare Other | Source: Ambulatory Visit | Attending: Physician Assistant | Admitting: Physician Assistant

## 2013-09-17 DIAGNOSIS — Z5189 Encounter for other specified aftercare: Secondary | ICD-10-CM

## 2013-11-09 ENCOUNTER — Ambulatory Visit (INDEPENDENT_AMBULATORY_CARE_PROVIDER_SITE_OTHER): Payer: Medicare Other

## 2013-11-09 ENCOUNTER — Encounter: Payer: Self-pay | Admitting: Podiatry

## 2013-11-09 ENCOUNTER — Ambulatory Visit (INDEPENDENT_AMBULATORY_CARE_PROVIDER_SITE_OTHER): Payer: Medicare Other | Admitting: Podiatry

## 2013-11-09 VITALS — BP 129/74 | HR 80 | Resp 18

## 2013-11-09 DIAGNOSIS — M779 Enthesopathy, unspecified: Secondary | ICD-10-CM

## 2013-11-09 DIAGNOSIS — R52 Pain, unspecified: Secondary | ICD-10-CM

## 2013-11-09 NOTE — Progress Notes (Signed)
   Subjective:    Patient ID: Susan Carpenter, female    DOB: 01/10/1946, 68 y.o.   MRN: [id number]  HPI my left foot has been hurting for about one year and I have a lot of arthritis and hurts on the ball of my foot and I have wore my inserts for about four or five years and hurts on top of my foot  Patient relates a history of fibromyalgia and psoriatic arthritis  Review of Systems  Constitutional: Negative.   HENT: Negative.   Eyes: Negative.   Respiratory: Negative.   Cardiovascular: Negative.   Gastrointestinal: Negative.   Endocrine: Negative.   Genitourinary: Negative.   Musculoskeletal: Positive for back pain.       Joint pain and difficulty walking  Skin: Positive for rash.  Allergic/Immunologic: Positive for environmental allergies and food allergies.  Neurological: Negative.   Hematological: Negative.   Psychiatric/Behavioral: Negative.        Objective:   Physical Exam  Orientated x643 68 year old white female  Vascular: DP and PT pulses are two over four bilaterally  Neurological: Knee and ankle reflexes equal and reactive bilaterally  Dermatological: No skin lesions noted. Texture and turgor within normal limits bilaterally  Musculoskeletal: Patient has a limping gait favoring the left foot. She is palpable tenderness in the second MPJ and the dorsal shaft of the second left metatarsal. This area seems to duplicates her area of discomfort.  She has existing rigid orthotics with a full extension and a cut out area of the second MPJ right and contour satisfactorily dated 2006   X-ray report weightbearing left foot  Intact bony structure without any obvious fracture and/or dislocation noted. X-ray artifacts noted in the oblique views on the shafts of the third and fifth metatarsals. The joint spaces appear unremarkable. Slight medial drifting of the second MPJ noted small posterior and inferior heel spusr present   Radiographic impression: No acute bony  abnormality noted left foot.      Assessment & Plan:   Assessment: Second MPJ capsulitis Possible beginning pre-dislocation syndrome second MPJ left Possible symptoms of psoriatic arthritis second MPJ left Rule out stress fracture  Plan: I attached a cut out pad on the existing orthotic on the left foot to offload the second MPJ. Patient will wear an athletic thick sole a shoe. Also, she will buddy tape toes 234 left with one-inch Coflex daily.Reappoint x3 weeks for x-ray of the left foot and further evaluation.

## 2013-11-30 ENCOUNTER — Ambulatory Visit (INDEPENDENT_AMBULATORY_CARE_PROVIDER_SITE_OTHER): Payer: Medicare Other

## 2013-11-30 ENCOUNTER — Ambulatory Visit (INDEPENDENT_AMBULATORY_CARE_PROVIDER_SITE_OTHER): Payer: Medicare Other | Admitting: Podiatry

## 2013-11-30 ENCOUNTER — Encounter: Payer: Self-pay | Admitting: Podiatry

## 2013-11-30 VITALS — BP 127/76 | HR 80 | Resp 12

## 2013-11-30 DIAGNOSIS — M79609 Pain in unspecified limb: Secondary | ICD-10-CM

## 2013-11-30 DIAGNOSIS — M779 Enthesopathy, unspecified: Secondary | ICD-10-CM

## 2013-11-30 MED ORDER — DEXAMETHASONE SODIUM PHOSPHATE 120 MG/30ML IJ SOLN
2.0000 mg | Freq: Once | INTRAMUSCULAR | Status: AC
  Administered 2013-11-30: 2 mg via INTRA_ARTICULAR

## 2013-12-01 NOTE — Progress Notes (Signed)
Patient ID: Susan BellmanDiane T Mounce, female   DOB: 06/18/1946, 68 y.o.   MRN: [id number]  Subjective: This patient presents for followup visit from 11/10/2013. 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 A surgical felt pad was attached to the existing orthotic to offload the second left MPJ. Patient has had minimal relief of symptoms since the above visit.  Objective: There is exquisite palpable tenderness in the second left intermetatarsal space without a palpable lesions. Mild palpable tenderness in the second left MPJ without a palpable lesions. There is no restriction of motion in the lesser MPJs on the left foot.  X-ray report weightbearing left foot  Intact bony structure without any fractures and or dislocations noted. X-ray artifact noted in the oblique view on the third metatarsal. Inferior heel spur present.  Radiographic impression: No acute bony abnormality noted  Assessment: Low-grade chronic second MPJ capsulitis Possible neuroma second left intermetatarsal space  Plan: The skin is prepped with alcohol Betadine and 2 milligrams of dexamethasone phosphate mixed with 2.5 mg of plain Marcaine were injected into the second left intermetatarsal space where a dexamethasone injection #1.  Maintain existing orthotic with the fill modification to offload the second MPJ.  Reappoint x30 days

## 2013-12-28 ENCOUNTER — Ambulatory Visit (INDEPENDENT_AMBULATORY_CARE_PROVIDER_SITE_OTHER): Payer: Medicare Other | Admitting: Podiatry

## 2013-12-28 ENCOUNTER — Encounter: Payer: Self-pay | Admitting: Podiatry

## 2013-12-28 VITALS — BP 123/72 | HR 83 | Resp 18

## 2013-12-28 DIAGNOSIS — M779 Enthesopathy, unspecified: Secondary | ICD-10-CM

## 2013-12-28 NOTE — Patient Instructions (Signed)
Wear a surgical shoe on left foot x6 weeks

## 2013-12-29 NOTE — Progress Notes (Signed)
Patient ID: Susan BellmanDiane T Carpenter, female   DOB: 07/18/1946, 68 y.o.   MRN: [id number]   Subjective: Patient presents for followup visit for persistent pain for approximately year and a half in around the left second MPJ area.  She has existing rigid orthotics with a full extension and a cut out area of the second MPJ rigid orthotic that contour satisfactorily, dated 2006.   On the visit of 12/01/2013 a dexamethasone phosphate injection was given into the second left intermetatarsal space. Patient describes approximately a day of relief and then the symptoms recurred.  Objective: There is no erythema edema ecchymosis noted in the left foot. There is palpable tenderness in the second and third metatarsal shafts without a palpable lesions. There is some palpable tenderness in the second third left intermetatarsal spaces down a palpable lesions.  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 Today I dispensed a surgical shoe with instructions to wear for 6 weeks to see if relative immobilization we'll provide her any relief of symptoms. Recheck x6 weeks and an x-ray left foot at next visit.

## 2014-02-08 ENCOUNTER — Ambulatory Visit: Payer: Medicare Other | Admitting: Podiatry

## 2015-07-01 ENCOUNTER — Ambulatory Visit: Payer: Self-pay | Admitting: Orthopedic Surgery

## 2015-07-01 NOTE — H&P (Signed)
Susan Carpenter DOB: 06/05/1946 Married / Language: English / Race: White Female  Date of Admission: 08/01/2015 CC: Right Knee Pain History of Present Illness The patient is a 68 year old female who comes in for a preoperative History and Physical. The patient is scheduled for a right total knee arthroplasty to be performed by Dr. Frank V. Aluisio, MD at [facility] on 08/01/2015. The patient is a 68 year old female who presented with knee complaints. The patient reports right knee (worse than left) symptoms including: pain .The patient feels that the symptoms are worsening. The patient has the current diagnosis of knee osteoarthritis. Prior to being seen, the patient was previously evaluated by a colleague (Dr. Deveshwar). Previous work-up for this problem has included knee x-rays and knee MRI. Past treatment for this problem has included intra-articular injection of corticosteroids (also finished a series of Hyalgan injections in November, 2015), nonsteroidal anti-inflammatory drugs (She is currently taking Celebrex. She reported that she did not find it to be any more helpful than Advil or Aleve) and physical therapy (strength training with weights (at [facility])). Susan Carpenter came for evaluation of her right and to a lesser extent her left knee. She has had problems with the right knee for years, has had cortisone shots in back in October, had Hyalgan in both knees, even though the left knee she states was not giving her too much difficulty. The left knee is not giving her really any problems at this point, but her right knee gives her pain in the medial aspect occasional sensation that it will give way, difficulty going up and down steps. She states the Hyalgan really did not help, the cortisone has not helped and she is here on referral from Dr. Deveshwar for possible total knee arthroplasty on the right. She has had problems with her right knee for a long time now. It has gotten progressively worse  over time. She states that the knee does not swell on her. She feels like does want to give out at times. Starting to limit what she can and cannot do. She has had an injection in the past without tremendous benefit. She is ready to proceed with surgery at this time. They have been treated conservatively in the past for the above stated problem and despite conservative measures, they continue to have progressive pain and severe functional limitations and dysfunction. They have failed non-operative management including home exercise, medications, and injections. It is felt that they would benefit from undergoing total joint replacement. Risks and benefits of the procedure have been discussed with the patient and they elect to proceed with surgery. There are no active contraindications to surgery such as ongoing infection or rapidly progressive neurological disease.  Problem List/Past Medical Primary osteoarthritis of right knee (M17.11) Osteoarthritis Fibromyalgia Chronic Cystitis Shingles Gastroesophageal Reflux Disease Hiatal Hernia Seasonal Allergies Degenerative Disc Disease Menopause Psoriatic Arthritis  Allergies Erythromycin *MACROLIDES* Rash. Penicillamine *Assorted Classes Rash. Keflex *CEPHALOSPORINS* Rash. Seafood Shrimp, Oysters - Please Note That She Can Use Betadine  Family History  Heart Disease Father. Cancer Mother. Osteoarthritis Father, Maternal Grandmother, Mother. Diabetes Mellitus Father, Mother. Depression Mother.  Social History Children 2 Current drinker 03/02/2015: Currently drinks beer and hard liquor only occasionally per week Exercise Exercises weekly; does gym / weights Living situation live with spouse Marital status married No history of drug/alcohol rehab Not under pain contract Number of flights of stairs before winded 2-3 Tobacco / smoke exposure 03/02/2015: no Current work status retired Tobacco use   Never  smoker. 03/02/2015 Advance Directives Living Will, Healthcare POA Post-Surgical Plans Home  Medication History Cyclobenzaprine HCl (10MG Tablet, Oral) Active. Fish Oil Active. Turmeric (Oral) Specific dose unknown - Active. Curcumin Active. Magnesium (Oral) Specific dose unknown - Active. Calcium (Oral) Specific dose unknown - Active. NexIUM (20MG Capsule DR, Oral) Active. ZyrTEC Allergy (10MG Tablet, Oral) Active. Aleve (220MG Tablet, Oral) Active. Claritin (10MG Tablet, Oral) Active. Benadryl (25MG Capsule, Oral) Active.  Past Surgical History Tubal Ligation Gallbladder Surgery Date: 1995. laporoscopic Dilation and Curettage of Uterus - Multiple  Review of Systems General Not Present- Chills, Fatigue, Fever, Memory Loss, Night Sweats, Weight Gain and Weight Loss. Skin Not Present- Eczema, Hives, Itching, Lesions and Rash. HEENT Not Present- Dentures, Double Vision, Headache, Hearing Loss, Tinnitus and Visual Loss. Respiratory Not Present- Allergies, Chronic Cough, Coughing up blood, Shortness of breath at rest and Shortness of breath with exertion. Cardiovascular Not Present- Chest Pain, Difficulty Breathing Lying Down, Murmur, Palpitations, Racing/skipping heartbeats and Swelling. Gastrointestinal Not Present- Abdominal Pain, Bloody Stool, Constipation, Diarrhea, Difficulty Swallowing, Heartburn, Jaundice, Loss of appetitie, Nausea and Vomiting. Female Genitourinary Present- Incontinence, Urinary frequency, Urinary Retention, Urinating at Night and Weak urinary stream. Not Present- Blood in Urine, Discharge, Flank Pain, Painful Urination and Urgency. Musculoskeletal Present- Back Pain, Joint Pain, Joint Swelling, Morning Stiffness and Muscle Weakness. Not Present- Muscle Pain and Spasms. Neurological Not Present- Blackout spells, Difficulty with balance, Dizziness, Paralysis, Tremor and Weakness. Psychiatric Not Present- Insomnia.  Vitals Weight: 186 lb Height:  65in Weight was reported by patient. Height was reported by patient. Body Surface Area: 1.92 m Body Mass Index: 30.95 kg/m  BP: 112/68 (Sitting, Right Arm, Standard)  Physical Exam General Mental Status -Alert, cooperative and good historian. General Appearance-pleasant, Not in acute distress. Orientation-Oriented X3. Build & Nutrition-Well nourished and Well developed.  Head and Neck Head-normocephalic, atraumatic . Neck Global Assessment - supple, no bruit auscultated on the right, no bruit auscultated on the left.  Eye Vision-Wears corrective lenses. Pupil - Bilateral-Regular and Round. Motion - Bilateral-EOMI.  Chest and Lung Exam Auscultation Breath sounds - clear at anterior chest wall and clear at posterior chest wall. Adventitious sounds - No Adventitious sounds.  Cardiovascular Auscultation Rhythm - Regular rate and rhythm. Heart Sounds - S1 WNL and S2 WNL. Murmurs & Other Heart Sounds - Auscultation of the heart reveals - No Murmurs.  Abdomen Palpation/Percussion Tenderness - Abdomen is non-tender to palpation. Rigidity (guarding) - Abdomen is soft. Auscultation Auscultation of the abdomen reveals - Bowel sounds normal.  Female Genitourinary Note: Not done, not pertinent to present illness   Musculoskeletal Note: On exam, she is a well-developed female, alert and oriented, in no apparent distress. Both hips show normal range of motion and no discomfort. Her right knee shows slight varus. There is no effusion. Her range is about 5 to 125 with marked crepitus on range of motion, tenderness medially greater than laterally with no instability. Left knee has no effusion, range 0 to 130, slight crepitus on range of motion. No tenderness, no instability noted. Pulses, sensation, motor are intact.  RADIOGRAPHS AP both knees and lateral show bone on bone arthritis in the medial and patellofemoral compartments of the right knee.   Assessment &  Plan Primary osteoarthritis of left knee (M17.12) Primary osteoarthritis of right knee (M17.11) Note:Surgical Plans: Right Total Knee Replacement  Disposition: Home  PCP: Dr. Barnes - Patient has been seen preoperatively and felt to be stable for surgery.  IV TXA  Anesthesia Issues:   History of Postop Nausea  Comments: Patient will need a 3-in-1 Commode for home use.  Signed electronically by Lando Alcalde L Carolee Channell, III PA-C         

## 2015-07-01 NOTE — Progress Notes (Signed)
Preoperative surgical orders have been place into the [facility] hospital system for Susan Carpenter on 07/01/2015, 5:02 PM  by Patrica Duel for surgery on 08/01/2015.  Preop Total Knee orders including Experal, IV Tylenol, and IV Decadron as long as there are no contraindications to the above medications. Avel Peace, PA-C

## 2015-07-18 NOTE — Patient Instructions (Addendum)
YOUR PROCEDURE IS SCHEDULED ON :  08/01/15  REPORT TO [facility] MAIN ENTRANCE FOLLOW SIGNS TO EAST ELEVATOR - GO TO 3rd FLOOR CHECK IN AT 3 EAST NURSES STATION (SHORT STAY) AT:  5:15 AM  CALL THIS NUMBER IF YOU HAVE PROBLEMS THE MORNING OF SURGERY [phone]  REMEMBER:ONLY 1 PER PERSON MAY GO TO SHORT STAY WITH YOU TO GET READY THE MORNING OF YOUR SURGERY  DO NOT EAT FOOD OR DRINK LIQUIDS AFTER MIDNIGHT  TAKE THESE MEDICINES THE MORNING OF SURGERY: NEXIUM / ZYRTEC / MAY USE EYE DROPS  YOU MAY NOT HAVE ANY METAL ON YOUR BODY INCLUDING HAIR PINS AND PIERCING'S. DO NOT WEAR JEWELRY, MAKEUP, LOTIONS, POWDERS OR PERFUMES. DO NOT WEAR NAIL POLISH. DO NOT SHAVE 48 HRS PRIOR TO SURGERY. MEN MAY SHAVE FACE AND NECK.  DO NOT BRING VALUABLES TO HOSPITAL. Susan Carpenter IS NOT RESPONSIBLE FOR VALUABLES.  CONTACTS, DENTURES OR PARTIALS MAY NOT BE WORN TO SURGERY. LEAVE SUITCASE IN CAR. CAN BE BROUGHT TO ROOM AFTER SURGERY.  PATIENTS DISCHARGED THE DAY OF SURGERY WILL NOT BE ALLOWED TO DRIVE HOME.  PLEASE READ OVER THE FOLLOWING INSTRUCTION SHEETS _________________________________________________________________________________                                          [facility] - PREPARING FOR SURGERY  Before surgery, you can play an important role.  Because skin is not sterile, your skin needs to be as free of germs as possible.  You can reduce the number of germs on your skin by washing with CHG (chlorahexidine gluconate) soap before surgery.  CHG is an antiseptic cleaner which kills germs and bonds with the skin to continue killing germs even after washing. Please DO NOT use if you have an allergy to CHG or antibacterial soaps.  If your skin becomes reddened/irritated stop using the CHG and inform your nurse when you arrive at Short Stay. Do not shave (including legs and underarms) for at least 48 hours prior to the first CHG shower.  You may shave your face. Please follow  these instructions carefully:   1.  Shower with CHG Soap the night before surgery and the  morning of Surgery.   2.  If you choose to wash your hair, wash your hair first as usual with your  normal  Shampoo.   3.  After you shampoo, rinse your hair and body thoroughly to remove the  shampoo.                                         4.  Use CHG as you would any other liquid soap.  You can apply chg directly  to the skin and wash . Gently wash with scrungie or clean wascloth    5.  Apply the CHG Soap to your body ONLY FROM THE NECK DOWN.   Do not use on open                           Wound or open sores. Avoid contact with eyes, ears mouth and genitals (private parts).                        Genitals (private  parts) with your normal soap.              6.  Wash thoroughly, paying special attention to the area where your surgery  will be performed.   7.  Thoroughly rinse your body with warm water from the neck down.   8.  DO NOT shower/wash with your normal soap after using and rinsing off  the CHG Soap .                9.  Pat yourself dry with a clean towel.             10.  Wear clean night clothes to bed after shower             11.  Place clean sheets on your bed the night of your first shower and do not  sleep with pets.  Day of Surgery : Do not apply any lotions/deodorants the morning of surgery.  Please wear clean clothes to the hospital/surgery center.  FAILURE TO FOLLOW THESE INSTRUCTIONS MAY RESULT IN THE CANCELLATION OF YOUR SURGERY    PATIENT SIGNATURE_________________________________  ______________________________________________________________________     Susan Carpenter  An incentive spirometer is a tool that can help keep your lungs clear and active. This tool measures how well you are filling your lungs with each breath. Taking long deep breaths may help reverse or decrease the chance of developing breathing (pulmonary) problems (especially infection)  following:  A long period of time when you are unable to move or be active. BEFORE THE PROCEDURE   If the spirometer includes an indicator to show your best effort, your nurse or respiratory therapist will set it to a desired goal.  If possible, sit up straight or lean slightly forward. Try not to slouch.  Hold the incentive spirometer in an upright position. INSTRUCTIONS FOR USE   Sit on the edge of your bed if possible, or sit up as far as you can in bed or on a chair.  Hold the incentive spirometer in an upright position.  Breathe out normally.  Place the mouthpiece in your mouth and seal your lips tightly around it.  Breathe in slowly and as deeply as possible, raising the piston or the ball toward the top of the column.  Hold your breath for 3-5 seconds or for as long as possible. Allow the piston or ball to fall to the bottom of the column.  Remove the mouthpiece from your mouth and breathe out normally.  Rest for a few seconds and repeat Steps 1 through 7 at least 10 times every 1-2 hours when you are awake. Take your time and take a few normal breaths between deep breaths.  The spirometer may include an indicator to show your best effort. Use the indicator as a goal to work toward during each repetition.  After each set of 10 deep breaths, practice coughing to be sure your lungs are clear. If you have an incision (the cut made at the time of surgery), support your incision when coughing by placing a pillow or rolled up towels firmly against it. Once you are able to get out of bed, walk around indoors and cough well. You may stop using the incentive spirometer when instructed by your caregiver.  RISKS AND COMPLICATIONS  Take your time so you do not get dizzy or light-headed.  If you are in pain, you may need to take or ask for pain medication before doing incentive spirometry. It is harder to  take a deep breath if you are having pain. AFTER USE  Rest and breathe slowly  and easily.  It can be helpful to keep track of a log of your progress. Your caregiver can provide you with a simple table to help with this. If you are using the spirometer at home, follow these instructions: SEEK MEDICAL CARE IF:   You are having difficultly using the spirometer.  You have trouble using the spirometer as often as instructed.  Your pain medication is not giving enough relief while using the spirometer.  You develop fever of 100.5 F (38.1 C) or higher. SEEK IMMEDIATE MEDICAL CARE IF:   You cough up bloody sputum that had not been present before.  You develop fever of 102 F (38.9 C) or greater.  You develop worsening pain at or near the incision site. MAKE SURE YOU:   Understand these instructions.  Will watch your condition.  Will get help right away if you are not doing well or get worse. Document Released: 03/04/2007 Document Revised: 01/14/2012 Document Reviewed: 05/05/2007 [employer] Patient Information 2014 [employer], [place].   ________________________________________________________________________  WHAT IS A BLOOD TRANSFUSION? Blood Transfusion Information  A transfusion is the replacement of blood or some of its parts. Blood is made up of multiple cells which provide different functions.  Red blood cells carry oxygen and are used for blood loss replacement.  White blood cells fight against infection.  Platelets control bleeding.  Plasma helps clot blood.  Other blood products are available for specialized needs, such as hemophilia or other clotting disorders. BEFORE THE TRANSFUSION  Who gives blood for transfusions?   Healthy volunteers who are fully evaluated to make sure their blood is safe. This is blood bank blood. Transfusion therapy is the safest it has ever been in the practice of medicine. Before blood is taken from a donor, a complete history is taken to make sure that person has no history of diseases nor engages in risky social  behavior (examples are intravenous drug use or sexual activity with multiple partners). The donor's travel history is screened to minimize risk of transmitting infections, such as malaria. The donated blood is tested for signs of infectious diseases, such as HIV and hepatitis. The blood is then tested to be sure it is compatible with you in order to minimize the chance of a transfusion reaction. If you or a relative donates blood, this is often done in anticipation of surgery and is not appropriate for emergency situations. It takes many days to process the donated blood. RISKS AND COMPLICATIONS Although transfusion therapy is very safe and saves many lives, the main dangers of transfusion include:   Getting an infectious disease.  Developing a transfusion reaction. This is an allergic reaction to something in the blood you were given. Every precaution is taken to prevent this. The decision to have a blood transfusion has been considered carefully by your caregiver before blood is given. Blood is not given unless the benefits outweigh the risks. AFTER THE TRANSFUSION  Right after receiving a blood transfusion, you will usually feel much better and more energetic. This is especially true if your red blood cells have gotten low (anemic). The transfusion raises the level of the red blood cells which carry oxygen, and this usually causes an energy increase.  The nurse administering the transfusion will monitor you carefully for complications. HOME CARE INSTRUCTIONS  No special instructions are needed after a transfusion. You may find your energy is better. Speak with your  caregiver about any limitations on activity for underlying diseases you may have. SEEK MEDICAL CARE IF:   Your condition is not improving after your transfusion.  You develop redness or irritation at the intravenous (IV) site. SEEK IMMEDIATE MEDICAL CARE IF:  Any of the following symptoms occur over the next 12 hours:  Shaking  chills.  You have a temperature by mouth above 102 F (38.9 C), not controlled by medicine.  Chest, back, or muscle pain.  People around you feel you are not acting correctly or are confused.  Shortness of breath or difficulty breathing.  Dizziness and fainting.  You get a rash or develop hives.  You have a decrease in urine output.  Your urine turns a dark color or changes to pink, red, or brown. Any of the following symptoms occur over the next 10 days:  You have a temperature by mouth above 102 F (38.9 C), not controlled by medicine.  Shortness of breath.  Weakness after normal activity.  The white part of the eye turns yellow (jaundice).  You have a decrease in the amount of urine or are urinating less often.  Your urine turns a dark color or changes to pink, red, or brown. Document Released: 10/19/2000 Document Revised: 01/14/2012 Document Reviewed: 06/07/2008 [facility] Patient Information 2014 [place], [place].  _______________________________________________________________________

## 2015-07-19 ENCOUNTER — Encounter (HOSPITAL_COMMUNITY)
Admission: RE | Admit: 2015-07-19 | Discharge: 2015-07-19 | Disposition: A | Discharge status: Home / Self Care | Payer: Medicare Other | Source: Ambulatory Visit | Attending: Orthopedic Surgery | Admitting: Orthopedic Surgery

## 2015-07-19 ENCOUNTER — Encounter (HOSPITAL_COMMUNITY): Payer: Self-pay

## 2015-07-19 DIAGNOSIS — Z01818 Encounter for other preprocedural examination: Secondary | ICD-10-CM | POA: Insufficient documentation

## 2015-07-19 HISTORY — DX: Constipation, unspecified: K59.00

## 2015-07-19 HISTORY — DX: Arthropathic psoriasis, unspecified: L40.50

## 2015-07-19 HISTORY — DX: Other complications of anesthesia, initial encounter: T88.59XA

## 2015-07-19 HISTORY — DX: Nausea with vomiting, unspecified: R11.2

## 2015-07-19 HISTORY — DX: Psoriasis, unspecified: L40.9

## 2015-07-19 HISTORY — DX: Other specified postprocedural states: Z98.890

## 2015-07-19 HISTORY — DX: Cardiac murmur, unspecified: R01.1

## 2015-07-19 HISTORY — DX: Adverse effect of unspecified anesthetic, initial encounter: T41.45XA

## 2015-07-19 LAB — URINALYSIS, ROUTINE W REFLEX MICROSCOPIC
Bilirubin Urine: NEGATIVE
Glucose, UA: NEGATIVE mg/dL
HGB URINE DIPSTICK: NEGATIVE
Ketones, ur: NEGATIVE mg/dL
LEUKOCYTES UA: NEGATIVE
NITRITE: NEGATIVE
PROTEIN: NEGATIVE mg/dL
Specific Gravity, Urine: 1.007 (ref 1.005–1.030)
UROBILINOGEN UA: 0.2 mg/dL (ref 0.0–1.0)
pH: 5 (ref 5.0–8.0)

## 2015-07-19 LAB — COMPREHENSIVE METABOLIC PANEL
ALK PHOS: 78 U/L (ref 38–126)
ALT: 24 U/L (ref 14–54)
AST: 24 U/L (ref 15–41)
Albumin: 3.9 g/dL (ref 3.5–5.0)
Anion gap: 7 (ref 5–15)
BILIRUBIN TOTAL: 0.5 mg/dL (ref 0.3–1.2)
BUN: 17 mg/dL (ref 6–20)
CALCIUM: 9.1 mg/dL (ref 8.9–10.3)
CO2: 26 mmol/L (ref 22–32)
CREATININE: 0.94 mg/dL (ref 0.44–1.00)
Chloride: 107 mmol/L (ref 101–111)
Glucose, Bld: 115 mg/dL — ABNORMAL HIGH (ref 65–99)
Potassium: 3.7 mmol/L (ref 3.5–5.1)
Sodium: 140 mmol/L (ref 135–145)
TOTAL PROTEIN: 7 g/dL (ref 6.5–8.1)

## 2015-07-19 LAB — SURGICAL PCR SCREEN
MRSA, PCR: NEGATIVE
Staphylococcus aureus: NEGATIVE

## 2015-07-19 LAB — CBC
HCT: 39.4 % (ref 36.0–46.0)
HEMOGLOBIN: 13.6 g/dL (ref 12.0–15.0)
MCH: 31.9 pg (ref 26.0–34.0)
MCHC: 34.5 g/dL (ref 30.0–36.0)
MCV: 92.5 fL (ref 78.0–100.0)
Platelets: 209 10*3/uL (ref 150–400)
RBC: 4.26 MIL/uL (ref 3.87–5.11)
RDW: 12.9 % (ref 11.5–15.5)
WBC: 8.5 10*3/uL (ref 4.0–10.5)

## 2015-07-19 LAB — PROTIME-INR
INR: 0.98 (ref 0.00–1.49)
PROTHROMBIN TIME: 13.2 s (ref 11.6–15.2)

## 2015-07-19 LAB — APTT: aPTT: 28 seconds (ref 24–37)

## 2015-07-26 NOTE — H&P (Signed)
Susan Carpenter DOB: [birth year] Married / Language: English / Race: White Female  Date of Admission: 08/01/2015 CC: Right Knee Pain History of Present Illness The patient is a 69 year old female who comes in for a preoperative History and Physical. The patient is scheduled for a right total knee arthroplasty to be performed by Dr. Gus Rankin. Aluisio, MD at [facility] on 08/01/2015. The patient is a 69 year old female who presented with knee complaints. The patient reports right knee (worse than left) symptoms including: pain .The patient feels that the symptoms are worsening. The patient has the current diagnosis of knee osteoarthritis. Prior to being seen, the patient was previously evaluated by a colleague (Dr. Corliss Skains). Previous work-up for this problem has included knee x-rays and knee MRI. Past treatment for this problem has included intra-articular injection of corticosteroids (also finished a series of Hyalgan injections in November, 2015), nonsteroidal anti-inflammatory drugs (She is currently taking Celebrex. She reported that she did not find it to be any more helpful than Advil or Aleve) and physical therapy (strength training with weights (at [facility])). Susan Carpenter came for evaluation of her right and to a lesser extent her left knee. She has had problems with the right knee for years, has had cortisone shots in back in October, had Hyalgan in both knees, even though the left knee she states was not giving her too much difficulty. The left knee is not giving her really any problems at this point, but her right knee gives her pain in the medial aspect occasional sensation that it will give way, difficulty going up and down steps. She states the Hyalgan really did not help, the cortisone has not helped and she is here on referral from Dr. Corliss Skains for possible total knee arthroplasty on the right. She has had problems with her right knee for a long time now. It has gotten progressively worse  over time. She states that the knee does not swell on her. She feels like does want to give out at times. Starting to limit what she can and cannot do. She has had an injection in the past without tremendous benefit. She is ready to proceed with surgery at this time. They have been treated conservatively in the past for the above stated problem and despite conservative measures, they continue to have progressive pain and severe functional limitations and dysfunction. They have failed non-operative management including home exercise, medications, and injections. It is felt that they would benefit from undergoing total joint replacement. Risks and benefits of the procedure have been discussed with the patient and they elect to proceed with surgery. There are no active contraindications to surgery such as ongoing infection or rapidly progressive neurological disease.  Problem List/Past Medical Primary osteoarthritis of right knee (M17.11) Osteoarthritis Fibromyalgia Chronic Cystitis Shingles Gastroesophageal Reflux Disease Hiatal Hernia Seasonal Allergies Degenerative Disc Disease Menopause Psoriatic Arthritis  Allergies Erythromycin *MACROLIDES* Rash. Penicillamine *Assorted Classes Rash. Keflex *CEPHALOSPORINS* Rash. Seafood Shrimp, Oysters - Please Note That She Can Use Betadine  Family History  Heart Disease Father. Cancer Mother. Osteoarthritis Father, Maternal Grandmother, Mother. Diabetes Mellitus Father, Mother. Depression Mother.  Social History Children 2 Current drinker 03/02/2015: Currently drinks beer and hard liquor only occasionally per week Exercise Exercises weekly; does gym / weights Living situation live with spouse Marital status married No history of drug/alcohol rehab Not under pain contract Number of flights of stairs before winded 2-3 Tobacco / smoke exposure 03/02/2015: no Current work status retired Tobacco use  Never  smoker. 03/02/2015 Advance Directives Living Will, Healthcare POA Post-Surgical Plans Home  Medication History Cyclobenzaprine HCl (  Tablet, Oral) Active. Fish Oil Active. Turmeric (Oral) Specific dose unknown - Active. Curcumin Active. Magnesium (Oral) Specific dose unknown - Active. Calcium (Oral) Specific dose unknown - Active. NexIUM (  Capsule DR, Oral) Active. ZyrTEC Allergy (  Tablet, Oral) Active. Aleve (  Tablet, Oral) Active. Claritin (  Tablet, Oral) Active. Benadryl (  Capsule, Oral) Active.  Past Surgical History Tubal Ligation Gallbladder Surgery Date: 34. laporoscopic Dilation and Curettage of Uterus - Multiple  Review of Systems General Not Present- Chills, Fatigue, Fever, Memory Loss, Night Sweats, Weight Gain and Weight Loss. Skin Not Present- Eczema, Hives, Itching, Lesions and Rash. HEENT Not Present- Dentures, Double Vision, Headache, Hearing Loss, Tinnitus and Visual Loss. Respiratory Not Present- Allergies, Chronic Cough, Coughing up blood, Shortness of breath at rest and Shortness of breath with exertion. Cardiovascular Not Present- Chest Pain, Difficulty Breathing Lying Down, Murmur, Palpitations, Racing/skipping heartbeats and Swelling. Gastrointestinal Not Present- Abdominal Pain, Bloody Stool, Constipation, Diarrhea, Difficulty Swallowing, Heartburn, Jaundice, Loss of appetitie, Nausea and Vomiting. Female Genitourinary Present- Incontinence, Urinary frequency, Urinary Retention, Urinating at Night and Weak urinary stream. Not Present- Blood in Urine, Discharge, Flank Pain, Painful Urination and Urgency. Musculoskeletal Present- Back Pain, Joint Pain, Joint Swelling, Morning Stiffness and Muscle Weakness. Not Present- Muscle Pain and Spasms. Neurological Not Present- Blackout spells, Difficulty with balance, Dizziness, Paralysis, Tremor and Weakness. Psychiatric Not Present- Insomnia.  Vitals Weight: 186 lb Height:  65in Weight was reported by patient. Height was reported by patient. Body Surface Area: 1.92 m Body Mass Index: 30.95 kg/m  BP: 112/68 (Sitting, Right Arm, Standard)  Physical Exam General Mental Status -Alert, cooperative and good historian. General Appearance-pleasant, Not in acute distress. Orientation-Oriented X3. Build & Nutrition-Well nourished and Well developed.  Head and Neck Head-normocephalic, atraumatic . Neck Global Assessment - supple, no bruit auscultated on the right, no bruit auscultated on the left.  Eye Vision-Wears corrective lenses. Pupil - Bilateral-Regular and Round. Motion - Bilateral-EOMI.  Chest and Lung Exam Auscultation Breath sounds - clear at anterior chest wall and clear at posterior chest wall. Adventitious sounds - No Adventitious sounds.  Cardiovascular Auscultation Rhythm - Regular rate and rhythm. Heart Sounds - S1 WNL and S2 WNL. Murmurs & Other Heart Sounds - Auscultation of the heart reveals - No Murmurs.  Abdomen Palpation/Percussion Tenderness - Abdomen is non-tender to palpation. Rigidity (guarding) - Abdomen is soft. Auscultation Auscultation of the abdomen reveals - Bowel sounds normal.  Female Genitourinary Note: Not done, not pertinent to present illness   Musculoskeletal Note: On exam, she is a well-developed female, alert and oriented, in no apparent distress. Both hips show normal range of motion and no discomfort. Her right knee shows slight varus. There is no effusion. Her range is about 5 to 125 with marked crepitus on range of motion, tenderness medially greater than laterally with no instability. Left knee has no effusion, range 0 to 130, slight crepitus on range of motion. No tenderness, no instability noted. Pulses, sensation, motor are intact.  RADIOGRAPHS AP both knees and lateral show bone on bone arthritis in the medial and patellofemoral compartments of the right knee.   Assessment &  Plan Primary osteoarthritis of left knee (M17.12) Primary osteoarthritis of right knee (M17.11) Note:Surgical Plans: Right Total Knee Replacement  Disposition: Home  PCP: Dr. Zachery Dauer - Patient has been seen preoperatively and felt to be stable for surgery.  IV TXA  Anesthesia Issues:  History of Postop Nausea  Comments: Patient will need a 3-in-1 Commode for home use.  Signed electronically by Lauraine Rinne, III PA-C

## 2015-07-31 NOTE — Anesthesia Preprocedure Evaluation (Addendum)
Anesthesia Evaluation  Patient identified by MRN, date of birth, ID band Patient awake    Reviewed: Allergy & Precautions, H&P , NPO status , Patient's Chart, lab work & pertinent test results  History of Anesthesia Complications (+) PONV and history of anesthetic complications  Airway Mallampati: I  TM Distance: >3 FB Neck ROM: full    Dental no notable dental hx.    Pulmonary neg pulmonary ROS,    Pulmonary exam normal breath sounds clear to auscultation       Cardiovascular negative cardio ROS Normal cardiovascular exam Rhythm:regular Rate:Normal     Neuro/Psych  Headaches, PSYCHIATRIC DISORDERS Depression  Neuromuscular disease    GI/Hepatic Neg liver ROS, GERD  Medicated,  Endo/Other    Renal/GU negative Renal ROS     Musculoskeletal  (+) Arthritis , Fibromyalgia -  Abdominal   Peds  Hematology negative hematology ROS (+)   Anesthesia Other Findings   Reproductive/Obstetrics negative OB ROS                            Anesthesia Physical Anesthesia Plan  ASA: II  Anesthesia Plan: Spinal   Post-op Pain Management:    Induction:   Airway Management Planned:   Additional Equipment:   Intra-op Plan:   Post-operative Plan:   Informed Consent: I have reviewed the patients History and Physical, chart, labs and discussed the procedure including the risks, benefits and alternatives for the proposed anesthesia with the patient or authorized representative who has indicated his/her understanding and acceptance.   Dental Advisory Given  Plan Discussed with: Anesthesiologist, CRNA and Surgeon  Anesthesia Plan Comments: (Hct 39, has anti- E antibodies)        Anesthesia Quick Evaluation

## 2015-08-01 ENCOUNTER — Inpatient Hospital Stay (HOSPITAL_COMMUNITY)
Admission: RE | Admit: 2015-08-01 | Discharge: 2015-08-04 | DRG: 470 | Disposition: A | Discharge status: Home Health | Payer: Medicare Other | Source: Ambulatory Visit | Attending: Orthopedic Surgery | Admitting: Orthopedic Surgery

## 2015-08-01 ENCOUNTER — Inpatient Hospital Stay (HOSPITAL_COMMUNITY): Payer: Medicare Other | Admitting: Anesthesiology

## 2015-08-01 ENCOUNTER — Encounter (HOSPITAL_COMMUNITY): Payer: Self-pay | Admitting: *Deleted

## 2015-08-01 ENCOUNTER — Encounter (HOSPITAL_COMMUNITY)
Admission: RE | Disposition: A | Discharge status: Home Health | Payer: Self-pay | Source: Ambulatory Visit | Attending: Orthopedic Surgery

## 2015-08-01 DIAGNOSIS — Z833 Family history of diabetes mellitus: Secondary | ICD-10-CM | POA: Diagnosis not present

## 2015-08-01 DIAGNOSIS — M797 Fibromyalgia: Secondary | ICD-10-CM | POA: Diagnosis present

## 2015-08-01 DIAGNOSIS — Z79899 Other long term (current) drug therapy: Secondary | ICD-10-CM

## 2015-08-01 DIAGNOSIS — M170 Bilateral primary osteoarthritis of knee: Principal | ICD-10-CM | POA: Diagnosis present

## 2015-08-01 DIAGNOSIS — K219 Gastro-esophageal reflux disease without esophagitis: Secondary | ICD-10-CM | POA: Diagnosis present

## 2015-08-01 DIAGNOSIS — M179 Osteoarthritis of knee, unspecified: Secondary | ICD-10-CM | POA: Diagnosis present

## 2015-08-01 DIAGNOSIS — Z7982 Long term (current) use of aspirin: Secondary | ICD-10-CM | POA: Diagnosis not present

## 2015-08-01 DIAGNOSIS — M1711 Unilateral primary osteoarthritis, right knee: Secondary | ICD-10-CM

## 2015-08-01 DIAGNOSIS — Z809 Family history of malignant neoplasm, unspecified: Secondary | ICD-10-CM | POA: Diagnosis not present

## 2015-08-01 DIAGNOSIS — N3020 Other chronic cystitis without hematuria: Secondary | ICD-10-CM | POA: Diagnosis present

## 2015-08-01 DIAGNOSIS — R110 Nausea: Secondary | ICD-10-CM | POA: Diagnosis present

## 2015-08-01 DIAGNOSIS — M25761 Osteophyte, right knee: Secondary | ICD-10-CM | POA: Diagnosis present

## 2015-08-01 DIAGNOSIS — Z6831 Body mass index (BMI) 31.0-31.9, adult: Secondary | ICD-10-CM | POA: Diagnosis not present

## 2015-08-01 DIAGNOSIS — M25561 Pain in right knee: Secondary | ICD-10-CM | POA: Diagnosis present

## 2015-08-01 DIAGNOSIS — K449 Diaphragmatic hernia without obstruction or gangrene: Secondary | ICD-10-CM | POA: Diagnosis present

## 2015-08-01 DIAGNOSIS — Z8249 Family history of ischemic heart disease and other diseases of the circulatory system: Secondary | ICD-10-CM | POA: Diagnosis not present

## 2015-08-01 DIAGNOSIS — L4050 Arthropathic psoriasis, unspecified: Secondary | ICD-10-CM | POA: Diagnosis present

## 2015-08-01 DIAGNOSIS — M1710 Unilateral primary osteoarthritis, unspecified knee: Secondary | ICD-10-CM | POA: Diagnosis present

## 2015-08-01 DIAGNOSIS — Z01812 Encounter for preprocedural laboratory examination: Secondary | ICD-10-CM | POA: Diagnosis not present

## 2015-08-01 DIAGNOSIS — J302 Other seasonal allergic rhinitis: Secondary | ICD-10-CM | POA: Diagnosis present

## 2015-08-01 HISTORY — PX: TOTAL KNEE ARTHROPLASTY: SHX125

## 2015-08-01 LAB — TYPE AND SCREEN
ABO/RH(D): O POS
Antibody Screen: POSITIVE
DAT, IgG: NEGATIVE
PT AG Type: NEGATIVE

## 2015-08-01 SURGERY — ARTHROPLASTY, KNEE, TOTAL
Anesthesia: Spinal | Site: Knee | Laterality: Right

## 2015-08-01 MED ORDER — MIDAZOLAM HCL 2 MG/2ML IJ SOLN
INTRAMUSCULAR | Status: AC
  Filled 2015-08-01: qty 4

## 2015-08-01 MED ORDER — FLEET ENEMA 7-19 GM/118ML RE ENEM: 1.0000 | ENEMA | Freq: Once | RECTAL | Status: DC | PRN

## 2015-08-01 MED ORDER — ONDANSETRON HCL 4 MG/2ML IJ SOLN
INTRAMUSCULAR | Status: DC | PRN
  Administered 2015-08-01: 4 mg via INTRAVENOUS

## 2015-08-01 MED ORDER — SODIUM CHLORIDE 0.9 % IJ SOLN
INTRAMUSCULAR | Status: DC | PRN
Start: 2015-08-01 — End: 2015-08-01
  Administered 2015-08-01: 30 mL

## 2015-08-01 MED ORDER — ACETAMINOPHEN 650 MG RE SUPP: 650.0000 mg | Freq: Four times a day (QID) | RECTAL | Status: DC | PRN

## 2015-08-01 MED ORDER — OXYCODONE HCL 5 MG PO TABS: 5.0000 mg | ORAL_TABLET | Freq: Once | ORAL | Status: DC | PRN

## 2015-08-01 MED ORDER — LACTATED RINGERS IV SOLN
INTRAVENOUS | Status: DC
  Administered 2015-08-01: 1000 mL via INTRAVENOUS

## 2015-08-01 MED ORDER — SODIUM CHLORIDE 0.9 % IJ SOLN
INTRAMUSCULAR | Status: AC
  Filled 2015-08-01: qty 50

## 2015-08-01 MED ORDER — LACTATED RINGERS IV SOLN
INTRAVENOUS | Status: DC | PRN
  Administered 2015-08-01 (×2): via INTRAVENOUS

## 2015-08-01 MED ORDER — HYDROMORPHONE HCL 1 MG/ML IJ SOLN: 0.2500 mg | INTRAMUSCULAR | Status: DC | PRN

## 2015-08-01 MED ORDER — ONDANSETRON HCL 4 MG PO TABS
4.0000 mg | ORAL_TABLET | Freq: Four times a day (QID) | ORAL | Status: DC | PRN
  Administered 2015-08-03 – 2015-08-04 (×5): 4 mg via ORAL
  Filled 2015-08-01 (×5): qty 1

## 2015-08-01 MED ORDER — MIDAZOLAM HCL 5 MG/5ML IJ SOLN
INTRAMUSCULAR | Status: DC | PRN
  Administered 2015-08-01: 2 mg via INTRAVENOUS

## 2015-08-01 MED ORDER — ACETAMINOPHEN 500 MG PO TABS
1000.0000 mg | ORAL_TABLET | Freq: Four times a day (QID) | ORAL | Status: AC
  Administered 2015-08-01 – 2015-08-02 (×4): 1000 mg via ORAL
  Filled 2015-08-01 (×4): qty 2

## 2015-08-01 MED ORDER — LIDOCAINE HCL (CARDIAC) 20 MG/ML IV SOLN
INTRAVENOUS | Status: AC
  Filled 2015-08-01: qty 5

## 2015-08-01 MED ORDER — PROPOFOL 10 MG/ML IV BOLUS
INTRAVENOUS | Status: AC
  Filled 2015-08-01: qty 20

## 2015-08-01 MED ORDER — OXYCODONE HCL 5 MG/5ML PO SOLN: 5.0000 mg | Freq: Once | ORAL | Status: DC | PRN

## 2015-08-01 MED ORDER — POTASSIUM CHLORIDE IN NACL 20-0.9 MEQ/L-% IV SOLN
INTRAVENOUS | Status: DC
  Administered 2015-08-01 – 2015-08-02 (×2): via INTRAVENOUS
  Filled 2015-08-01 (×4): qty 1000

## 2015-08-01 MED ORDER — ACETAMINOPHEN 10 MG/ML IV SOLN
1000.0000 mg | Freq: Once | INTRAVENOUS | Status: AC
  Administered 2015-08-01: 1000 mg via INTRAVENOUS

## 2015-08-01 MED ORDER — DEXAMETHASONE SODIUM PHOSPHATE 10 MG/ML IJ SOLN
10.0000 mg | Freq: Once | INTRAMUSCULAR | Status: AC
  Administered 2015-08-02: 10 mg via INTRAVENOUS
  Filled 2015-08-01: qty 1

## 2015-08-01 MED ORDER — SODIUM CHLORIDE 0.9 % IV SOLN: INTRAVENOUS | Status: DC

## 2015-08-01 MED ORDER — ACETAMINOPHEN 325 MG PO TABS: 650.0000 mg | ORAL_TABLET | Freq: Four times a day (QID) | ORAL | Status: DC | PRN

## 2015-08-01 MED ORDER — TRAMADOL HCL 50 MG PO TABS
50.0000 mg | ORAL_TABLET | Freq: Four times a day (QID) | ORAL | Status: DC | PRN
  Administered 2015-08-01 – 2015-08-03 (×5): 100 mg via ORAL
  Filled 2015-08-01 (×5): qty 2

## 2015-08-01 MED ORDER — ONDANSETRON HCL 4 MG/2ML IJ SOLN
4.0000 mg | Freq: Four times a day (QID) | INTRAMUSCULAR | Status: DC | PRN
  Administered 2015-08-01 – 2015-08-02 (×2): 4 mg via INTRAVENOUS
  Filled 2015-08-01 (×2): qty 2

## 2015-08-01 MED ORDER — BUPIVACAINE LIPOSOME 1.3 % IJ SUSP
INTRAMUSCULAR | Status: DC | PRN
  Administered 2015-08-01: 20 mL

## 2015-08-01 MED ORDER — BUPIVACAINE HCL (PF) 0.25 % IJ SOLN
INTRAMUSCULAR | Status: AC
  Filled 2015-08-01: qty 30

## 2015-08-01 MED ORDER — SODIUM CHLORIDE 0.9 % IR SOLN
Status: DC | PRN
  Administered 2015-08-01: 1000 mL

## 2015-08-01 MED ORDER — VANCOMYCIN HCL IN DEXTROSE 1-5 GM/200ML-% IV SOLN
1000.0000 mg | INTRAVENOUS | Status: AC
  Administered 2015-08-01: 1000 mg via INTRAVENOUS
  Filled 2015-08-01 (×2): qty 200

## 2015-08-01 MED ORDER — KETOROLAC TROMETHAMINE 15 MG/ML IJ SOLN
7.5000 mg | Freq: Four times a day (QID) | INTRAMUSCULAR | Status: AC | PRN
  Administered 2015-08-01 (×2): 7.5 mg via INTRAVENOUS
  Filled 2015-08-01 (×2): qty 1

## 2015-08-01 MED ORDER — CHLORHEXIDINE GLUCONATE 4 % EX LIQD: 60.0000 mL | Freq: Once | CUTANEOUS | Status: DC

## 2015-08-01 MED ORDER — HYDROMORPHONE HCL 2 MG PO TABS
2.0000 mg | ORAL_TABLET | ORAL | Status: DC | PRN
  Administered 2015-08-01: 2 mg via ORAL
  Administered 2015-08-01: 4 mg via ORAL
  Administered 2015-08-02: 2 mg via ORAL
  Administered 2015-08-02 – 2015-08-03 (×3): 4 mg via ORAL
  Administered 2015-08-03 (×2): 2 mg via ORAL
  Administered 2015-08-03 – 2015-08-04 (×4): 4 mg via ORAL
  Filled 2015-08-01: qty 2
  Filled 2015-08-01: qty 1
  Filled 2015-08-01 (×2): qty 2
  Filled 2015-08-01: qty 1
  Filled 2015-08-01 (×2): qty 2
  Filled 2015-08-01: qty 1
  Filled 2015-08-01 (×4): qty 2
  Filled 2015-08-01: qty 1

## 2015-08-01 MED ORDER — ONDANSETRON HCL 4 MG/2ML IJ SOLN: 4.0000 mg | Freq: Once | INTRAMUSCULAR | Status: DC | PRN

## 2015-08-01 MED ORDER — RIVAROXABAN 10 MG PO TABS
10.0000 mg | ORAL_TABLET | Freq: Every day | ORAL | Status: DC
  Administered 2015-08-02 – 2015-08-04 (×3): 10 mg via ORAL
  Filled 2015-08-01 (×4): qty 1

## 2015-08-01 MED ORDER — METHOCARBAMOL 1000 MG/10ML IJ SOLN
500.0000 mg | Freq: Four times a day (QID) | INTRAVENOUS | Status: DC | PRN
  Administered 2015-08-01: 500 mg via INTRAVENOUS
  Filled 2015-08-01 (×2): qty 5

## 2015-08-01 MED ORDER — TRAZODONE HCL 50 MG PO TABS: 50.0000 mg | ORAL_TABLET | Freq: Every evening | ORAL | Status: DC | PRN

## 2015-08-01 MED ORDER — PHENOL 1.4 % MT LIQD: 1.0000 | OROMUCOSAL | Status: DC | PRN

## 2015-08-01 MED ORDER — ACETAMINOPHEN 500 MG PO TABS: 1000.0000 mg | ORAL_TABLET | Freq: Four times a day (QID) | ORAL | Status: DC

## 2015-08-01 MED ORDER — BUPIVACAINE LIPOSOME 1.3 % IJ SUSP
20.0000 mL | Freq: Once | INTRAMUSCULAR | Status: DC
  Filled 2015-08-01: qty 20

## 2015-08-01 MED ORDER — METOCLOPRAMIDE HCL 5 MG/ML IJ SOLN
5.0000 mg | Freq: Three times a day (TID) | INTRAMUSCULAR | Status: DC | PRN
  Administered 2015-08-02: 10 mg via INTRAVENOUS
  Filled 2015-08-01: qty 2

## 2015-08-01 MED ORDER — ACETAMINOPHEN 10 MG/ML IV SOLN
INTRAVENOUS | Status: AC
  Filled 2015-08-01: qty 100

## 2015-08-01 MED ORDER — BUPIVACAINE HCL 0.25 % IJ SOLN
INTRAMUSCULAR | Status: DC | PRN
  Administered 2015-08-01: 30 mL

## 2015-08-01 MED ORDER — FENTANYL CITRATE (PF) 100 MCG/2ML IJ SOLN
INTRAMUSCULAR | Status: AC
  Filled 2015-08-01: qty 4

## 2015-08-01 MED ORDER — 0.9 % SODIUM CHLORIDE (POUR BTL) OPTIME
TOPICAL | Status: DC | PRN
  Administered 2015-08-01: 1000 mL

## 2015-08-01 MED ORDER — DEXAMETHASONE SODIUM PHOSPHATE 10 MG/ML IJ SOLN
10.0000 mg | Freq: Once | INTRAMUSCULAR | Status: AC
  Administered 2015-08-01: 10 mg via INTRAVENOUS

## 2015-08-01 MED ORDER — DOCUSATE SODIUM 100 MG PO CAPS
100.0000 mg | ORAL_CAPSULE | Freq: Two times a day (BID) | ORAL | Status: DC
  Administered 2015-08-01 – 2015-08-04 (×6): 100 mg via ORAL

## 2015-08-01 MED ORDER — BISACODYL 10 MG RE SUPP: 10.0000 mg | Freq: Every day | RECTAL | Status: DC | PRN

## 2015-08-01 MED ORDER — SODIUM CHLORIDE 0.9 % IV SOLN
1000.0000 mg | INTRAVENOUS | Status: AC
  Administered 2015-08-01: 1000 mg via INTRAVENOUS
  Filled 2015-08-01: qty 10

## 2015-08-01 MED ORDER — FENTANYL CITRATE (PF) 100 MCG/2ML IJ SOLN
INTRAMUSCULAR | Status: DC | PRN
  Administered 2015-08-01: 100 ug via INTRAVENOUS

## 2015-08-01 MED ORDER — METHOCARBAMOL 500 MG PO TABS
500.0000 mg | ORAL_TABLET | Freq: Four times a day (QID) | ORAL | Status: DC | PRN
  Administered 2015-08-01 – 2015-08-03 (×4): 500 mg via ORAL
  Filled 2015-08-01 (×5): qty 1

## 2015-08-01 MED ORDER — OXYCODONE HCL 5 MG PO TABS
5.0000 mg | ORAL_TABLET | ORAL | Status: DC | PRN
  Administered 2015-08-01: 5 mg via ORAL
  Filled 2015-08-01: qty 1

## 2015-08-01 MED ORDER — CYCLOSPORINE 0.05 % OP EMUL
1.0000 [drp] | Freq: Two times a day (BID) | OPHTHALMIC | Status: DC
  Administered 2015-08-01 – 2015-08-04 (×6): 1 [drp] via OPHTHALMIC
  Filled 2015-08-01 (×7): qty 1

## 2015-08-01 MED ORDER — BUPIVACAINE IN DEXTROSE 0.75-8.25 % IT SOLN
INTRATHECAL | Status: DC | PRN
  Administered 2015-08-01: 1.8 mL via INTRATHECAL

## 2015-08-01 MED ORDER — POLYETHYLENE GLYCOL 3350 17 G PO PACK: 17.0000 g | PACK | Freq: Every day | ORAL | Status: DC | PRN

## 2015-08-01 MED ORDER — METOCLOPRAMIDE HCL 10 MG PO TABS
5.0000 mg | ORAL_TABLET | Freq: Three times a day (TID) | ORAL | Status: DC | PRN
  Administered 2015-08-03: 10 mg via ORAL
  Filled 2015-08-01: qty 1

## 2015-08-01 MED ORDER — LORATADINE 10 MG PO TABS
10.0000 mg | ORAL_TABLET | Freq: Every day | ORAL | Status: DC
  Administered 2015-08-02 – 2015-08-04 (×3): 10 mg via ORAL
  Filled 2015-08-01 (×3): qty 1

## 2015-08-01 MED ORDER — DIPHENHYDRAMINE HCL 12.5 MG/5ML PO ELIX
12.5000 mg | ORAL_SOLUTION | ORAL | Status: DC | PRN
  Administered 2015-08-03 – 2015-08-04 (×4): 12.5 mg via ORAL
  Filled 2015-08-01 (×4): qty 5

## 2015-08-01 MED ORDER — LACTATED RINGERS IV SOLN: INTRAVENOUS | Status: DC

## 2015-08-01 MED ORDER — VANCOMYCIN HCL IN DEXTROSE 1-5 GM/200ML-% IV SOLN
1000.0000 mg | Freq: Two times a day (BID) | INTRAVENOUS | Status: AC
  Administered 2015-08-01: 1000 mg via INTRAVENOUS
  Filled 2015-08-01: qty 200

## 2015-08-01 MED ORDER — DIPHENHYDRAMINE HCL 25 MG PO CAPS
25.0000 mg | ORAL_CAPSULE | Freq: Three times a day (TID) | ORAL | Status: DC | PRN
Start: 2015-08-01 — End: 2015-08-04

## 2015-08-01 MED ORDER — PANTOPRAZOLE SODIUM 40 MG PO TBEC
40.0000 mg | DELAYED_RELEASE_TABLET | Freq: Every day | ORAL | Status: DC
  Filled 2015-08-01: qty 1

## 2015-08-01 MED ORDER — MENTHOL 3 MG MT LOZG: 1.0000 | LOZENGE | OROMUCOSAL | Status: DC | PRN

## 2015-08-01 MED ORDER — MORPHINE SULFATE (PF) 2 MG/ML IV SOLN
1.0000 mg | INTRAVENOUS | Status: DC | PRN
  Administered 2015-08-01 – 2015-08-03 (×3): 1 mg via INTRAVENOUS
  Filled 2015-08-01 (×3): qty 1

## 2015-08-01 MED ORDER — SODIUM CHLORIDE 0.9 % IV BOLUS (SEPSIS)
250.0000 mL | Freq: Once | INTRAVENOUS | Status: AC
  Administered 2015-08-01: 250 mL via INTRAVENOUS

## 2015-08-01 MED ORDER — PROPOFOL 500 MG/50ML IV EMUL
INTRAVENOUS | Status: DC | PRN
  Administered 2015-08-01: 50 ug/kg/min via INTRAVENOUS

## 2015-08-01 SURGICAL SUPPLY — 64 items
BAG DECANTER FOR FLEXI CONT (MISCELLANEOUS) IMPLANT
BAG SPEC THK2 15X12 ZIP CLS (MISCELLANEOUS) ×1
BAG ZIPLOCK 12X15 (MISCELLANEOUS) ×2 IMPLANT
BANDAGE ELASTIC 6 VELCRO ST LF (GAUZE/BANDAGES/DRESSINGS) ×2 IMPLANT
BANDAGE ESMARK 6X9 LF (GAUZE/BANDAGES/DRESSINGS) ×1 IMPLANT
BLADE SAG 18X100X1.27 (BLADE) ×2 IMPLANT
BLADE SAW SGTL 11.0X1.19X90.0M (BLADE) ×2 IMPLANT
BNDG CMPR 9X6 STRL LF SNTH (GAUZE/BANDAGES/DRESSINGS) ×1
BNDG ESMARK 6X9 LF (GAUZE/BANDAGES/DRESSINGS) ×2
BOWL SMART MIX CTS (DISPOSABLE) ×2 IMPLANT
CAPT KNEE TOTAL 3 ATTUNE ×2 IMPLANT
CEMENT HV SMART SET (Cement) ×4 IMPLANT
CUFF TOURN SGL QUICK 34 (TOURNIQUET CUFF) ×2
CUFF TRNQT CYL 34X4X40X1 (TOURNIQUET CUFF) ×1 IMPLANT
DECANTER SPIKE VIAL GLASS SM (MISCELLANEOUS) ×2 IMPLANT
DRAPE EXTREMITY T 121X128X90 (DRAPE) ×2 IMPLANT
DRAPE POUCH INSTRU U-SHP 10X18 (DRAPES) ×2 IMPLANT
DRAPE U-SHAPE 47X51 STRL (DRAPES) ×2 IMPLANT
DRSG ADAPTIC 3X8 NADH LF (GAUZE/BANDAGES/DRESSINGS) ×2 IMPLANT
DRSG PAD ABDOMINAL 8X10 ST (GAUZE/BANDAGES/DRESSINGS) ×2 IMPLANT
DURAPREP 26ML APPLICATOR (WOUND CARE) ×2 IMPLANT
ELECT REM PT RETURN 9FT ADLT (ELECTROSURGICAL) ×2
ELECTRODE REM PT RTRN 9FT ADLT (ELECTROSURGICAL) ×1 IMPLANT
EVACUATOR 1/8 PVC DRAIN (DRAIN) ×2 IMPLANT
FACESHIELD WRAPAROUND (MASK) ×10 IMPLANT
GAUZE SPONGE 4X4 12PLY STRL (GAUZE/BANDAGES/DRESSINGS) ×2 IMPLANT
GLOVE BIO SURGEON STRL SZ7.5 (GLOVE) ×2 IMPLANT
GLOVE BIO SURGEON STRL SZ8 (GLOVE) ×2 IMPLANT
GLOVE BIOGEL PI IND STRL 6.5 (GLOVE) IMPLANT
GLOVE BIOGEL PI IND STRL 8 (GLOVE) ×1 IMPLANT
GLOVE BIOGEL PI INDICATOR 6.5 (GLOVE)
GLOVE BIOGEL PI INDICATOR 8 (GLOVE) ×1
GLOVE SURG SS PI 6.5 STRL IVOR (GLOVE) ×2 IMPLANT
GOWN STRL REUS W/TWL LRG LVL3 (GOWN DISPOSABLE) ×2 IMPLANT
GOWN STRL REUS W/TWL XL LVL3 (GOWN DISPOSABLE) IMPLANT
HANDPIECE INTERPULSE COAX TIP (DISPOSABLE) ×2
IMMOBILIZER KNEE 20 (SOFTGOODS) ×2 IMPLANT
IMMOBILIZER KNEE 20 THIGH 36 (SOFTGOODS) IMPLANT
KIT BASIN OR (CUSTOM PROCEDURE TRAY) ×2 IMPLANT
MANIFOLD NEPTUNE II (INSTRUMENTS) ×2 IMPLANT
NDL SAFETY ECLIPSE 18X1.5 (NEEDLE) ×2 IMPLANT
NEEDLE HYPO 18GX1.5 SHARP (NEEDLE) ×4
NS IRRIG 1000ML POUR BTL (IV SOLUTION) ×2 IMPLANT
PACK TOTAL JOINT (CUSTOM PROCEDURE TRAY) ×2 IMPLANT
PAD ABD 8X10 STRL (GAUZE/BANDAGES/DRESSINGS) ×2 IMPLANT
PADDING CAST COTTON 6X4 STRL (CAST SUPPLIES) ×6 IMPLANT
PEN SKIN MARKING BROAD (MISCELLANEOUS) ×2 IMPLANT
POSITIONER SURGICAL ARM (MISCELLANEOUS) ×2 IMPLANT
SET HNDPC FAN SPRY TIP SCT (DISPOSABLE) ×1 IMPLANT
STRIP CLOSURE SKIN 1/2X4 (GAUZE/BANDAGES/DRESSINGS) ×2 IMPLANT
SUCTION FRAZIER 12FR DISP (SUCTIONS) ×2 IMPLANT
SUT MNCRL AB 4-0 PS2 18 (SUTURE) ×2 IMPLANT
SUT VIC AB 2-0 CT1 27 (SUTURE) ×6
SUT VIC AB 2-0 CT1 TAPERPNT 27 (SUTURE) ×3 IMPLANT
SUT VLOC 180 0 24IN GS25 (SUTURE) ×2 IMPLANT
SYR 20CC LL (SYRINGE) ×2 IMPLANT
SYR 50ML LL SCALE MARK (SYRINGE) IMPLANT
TOWEL OR 17X26 10 PK STRL BLUE (TOWEL DISPOSABLE) ×2 IMPLANT
TOWEL OR NON WOVEN STRL DISP B (DISPOSABLE) IMPLANT
TRAY FOLEY W/METER SILVER 14FR (SET/KITS/TRAYS/PACK) ×2 IMPLANT
TRAY FOLEY W/METER SILVER 16FR (SET/KITS/TRAYS/PACK) IMPLANT
WATER STERILE IRR 1500ML POUR (IV SOLUTION) ×2 IMPLANT
WRAP KNEE MAXI GEL POST OP (GAUZE/BANDAGES/DRESSINGS) ×2 IMPLANT
YANKAUER SUCT BULB TIP 10FT TU (MISCELLANEOUS) ×2 IMPLANT

## 2015-08-01 NOTE — Progress Notes (Signed)
Utilization review completed.  

## 2015-08-01 NOTE — Op Note (Signed)
Pre-operative diagnosis- Osteoarthritis  Right knee(s)  Post-operative diagnosis- Osteoarthritis Right knee(s)  Procedure-  Right  Total Knee Arthroplasty  Surgeon- Gus Rankin. Aluisio, MD  Assistant- Dimitri Ped, PA-C   Anesthesia-  Spinal  EBL-* No blood loss amount entered *   Drains Hemovac  Tourniquet time-  Total Tourniquet Time Documented: Thigh (Right) - 32 minutes Total: Thigh (Right) - 32 minutes     Complications- None  Condition-PACU - hemodynamically stable.   Brief Clinical Note  Susan Carpenter is a 69 y.o. year old female with end stage OA of her right knee with progressively worsening pain and dysfunction. She has constant pain, with activity and at rest and significant functional deficits with difficulties even with ADLs. She has had extensive non-op management including analgesics, injections of cortisone, and home exercise program, but remains in significant pain with significant dysfunction.Radiographs show bone on bone arthritis medial and patellofemoral. She presents now for right Total Knee Arthroplasty.    Procedure in detail---   The patient is brought into the operating room and positioned supine on the operating table. After successful administration of  Spinal,   a tourniquet is placed high on the  Right thigh(s) and the lower extremity is prepped and draped in the usual sterile fashion. Time out is performed by the operating team and then the  Right lower extremity is wrapped in Esmarch, knee flexed and the tourniquet inflated to 300 mmHg.       A midline incision is made with a ten blade through the subcutaneous tissue to the level of the extensor mechanism. A fresh blade is used to make a medial parapatellar arthrotomy. Soft tissue over the proximal medial tibia is subperiosteally elevated to the joint line with a knife and into the semimembranosus bursa with a Cobb elevator. Soft tissue over the proximal lateral tibia is elevated with attention being  paid to avoiding the patellar tendon on the tibial tubercle. The patella is everted, knee flexed 90 degrees and the ACL and PCL are removed. Findings are bone on bone medial and patellofemoral with large medial osteophytes.       The drill is used to create a starting hole in the distal femur and the canal is thoroughly irrigated with sterile saline to remove the fatty contents. The 5 degree Right  valgus alignment guide is placed into the femoral canal and the distal femoral cutting block is pinned to remove 9 mm off the distal femur. Resection is made with an oscillating saw.      The tibia is subluxed forward and the menisci are removed. The extramedullary alignment guide is placed referencing proximally at the medial aspect of the tibial tubercle and distally along the second metatarsal axis and tibial crest. The block is pinned to remove 2mm off the more deficient medial  side. Resection is made with an oscillating saw. Size 5is the most appropriate size for the tibia and the proximal tibia is prepared with the modular drill and keel punch for that size.      The femoral sizing guide is placed and size 5 is most appropriate. Rotation is marked off the epicondylar axis and confirmed by creating a rectangular flexion gap at 90 degrees. The size 5 cutting block is pinned in this rotation and the anterior, posterior and chamfer cuts are made with the oscillating saw. The intercondylar block is then placed and that cut is made.      Trial size 5 tibial component, trial size 5 posterior  stabilized femur and a 10  mm posterior stabilized rotating platform insert trial is placed. Full extension is achieved with excellent varus/valgus and anterior/posterior balance throughout full range of motion. The patella is everted and thickness measured to be 22  mm. Free hand resection is taken to 12 mm, a 38 template is placed, lug holes are drilled, trial patella is placed, and it tracks normally. Osteophytes are removed  off the posterior femur with the trial in place. All trials are removed and the cut bone surfaces prepared with pulsatile lavage. Cement is mixed and once ready for implantation, the size 5 tibial implant, size  5  Narrow posterior stabilized femoral component, and the size 38 patella are cemented in place and the patella is held with the clamp. The trial insert is placed and the knee held in full extension. The Exparel (20 ml mixed with 30 ml saline) and .25% Bupivicaine, are injected into the extensor mechanism, posterior capsule, medial and lateral gutters and subcutaneous tissues.  All extruded cement is removed and once the cement is hard the permanent 10 mm posterior stabilized rotating platform insert is placed into the tibial tray.      The wound is copiously irrigated with saline solution and the extensor mechanism closed over a hemovac drain with #1 V-loc suture. The tourniquet is released for a total tourniquet time of 32  minutes. Flexion against gravity is 140 degrees and the patella tracks normally. Subcutaneous tissue is closed with 2.0 vicryl and subcuticular with running 4.0 Monocryl. The incision is cleaned and dried and steri-strips and a bulky sterile dressing are applied. The limb is placed into a knee immobilizer and the patient is awakened and transported to recovery in stable condition.      Please note that a surgical assistant was a medical necessity for this procedure in order to perform it in a safe and expeditious manner. Surgical assistant was necessary to retract the ligaments and vital neurovascular structures to prevent injury to them and also necessary for proper positioning of the limb to allow for anatomic placement of the prosthesis.   Gus Rankin Aluisio, MD    08/01/2015, 8:06 AM

## 2015-08-01 NOTE — Anesthesia Procedure Notes (Signed)
Spinal Patient location during procedure: OR Staffing Resident/CRNA: Noralyn Pick D Performed by: resident/CRNA  Preanesthetic Checklist Completed: patient identified, site marked, surgical consent, pre-op evaluation, timeout performed, IV checked, risks and benefits discussed and monitors and equipment checked Spinal Block Patient position: sitting Prep: Betadine Patient monitoring: heart rate, continuous pulse ox and blood pressure Location: L4-5 Injection technique: single-shot Needle Needle type: Spinocan  Needle gauge: 22 G Needle length: 9 cm Additional Notes Expiration date of kit checked and confirmed. Patient tolerated procedure well, without complications.

## 2015-08-01 NOTE — Progress Notes (Signed)
Pt complaining of sudden nausea and feeling "faint". She does appear pale and her pulse on the pulse ox machine is fluctuating from low 40s to high 30s. Last several readings were in the 60s. Dr. Lequita Halt called and made aware. No new orders given. Medicated patient for nausea. Will continue to monitor.

## 2015-08-01 NOTE — Evaluation (Signed)
Physical Therapy Evaluation Patient Details Name: Susan Carpenter MRN: [id number] DOB: 1946-07-20 Today's Date: 08/01/2015   History of Present Illness  69 yo female s/p R TKA 08/01/15  Clinical Impression  On eval, pt required Min assist for mobility-performed stand pivot from bed to recliner with RW. Starting BP at rest-BP 114/68. Mobility limited by dizziness-BP 90/43 after stand pivot. Reclined pt and reassessed BP after ~5 mintues-BP 95/45. RN and MD aware. Will attempt to progress activity on tomorrow.     Follow Up Recommendations Home health PT    Equipment Recommendations  Rolling walker with 5" wheels    Recommendations for Other Services       Precautions / Restrictions Precautions Precautions: Knee Restrictions Weight Bearing Restrictions: No RLE Weight Bearing: Weight bearing as tolerated      Mobility  Bed Mobility Overal bed mobility: Needs Assistance Bed Mobility: Supine to Sit     Supine to sit: Min assist     General bed mobility comments: assist for R LE  Transfers Overall transfer level: Needs assistance Equipment used: Rolling walker (2 wheeled) Transfers: Sit to/from [employer] Sit to Stand: Min assist Stand pivot transfers: Min assist       General transfer comment: small amount of assist to rise, stabilize, control descent. VCs safety, technique, hand placement. Stand pivot from bed to recliner with RW. Pt c/o dizziness once seated in chair-BP 90/43  Ambulation/Gait             General Gait Details: NT  Stairs            Wheelchair Mobility    Modified Rankin (Stroke Patients Only)       Balance                                             Pertinent Vitals/Pain Pain Assessment: 0-10 Pain Score: 7  Pain Location: R knee Pain Descriptors / Indicators: Aching;Sore    Home Living Family/patient expects to be discharged to:: Private residence Living Arrangements: Spouse/significant  other Available Help at Discharge: Family Type of Home: House Home Access: Stairs to enter Entrance Stairs-Rails: Right Entrance Stairs-Number of Steps: 3 Home Layout: Two level;Able to live on main level with bedroom/bathroom Home Equipment: None      Prior Function Level of Independence: Independent               Hand Dominance        Extremity/Trunk Assessment   Upper Extremity Assessment: Defer to OT evaluation           Lower Extremity Assessment: RLE deficits/detail RLE Deficits / Details: moves ankle well    Cervical / Trunk Assessment: Normal  Communication   Communication: No difficulties  Cognition Arousal/Alertness: Awake/alert Behavior During Therapy: WFL for tasks assessed/performed Overall Cognitive Status: Within Functional Limits for tasks assessed                      General Comments      Exercises        Assessment/Plan    PT Assessment Patient needs continued PT services  PT Diagnosis Difficulty walking;Acute pain   PT Problem List Decreased strength;Decreased range of motion;Decreased activity tolerance;Decreased balance;Decreased mobility;Decreased knowledge of use of DME;Pain  PT Treatment Interventions Gait training;Functional mobility training;Therapeutic activities;Patient/family education;Therapeutic exercise;Balance training;Stair training   PT Goals (Current goals  can be found in the Care Plan section) Acute Rehab PT Goals Patient Stated Goal: to feel better. less pain PT Goal Formulation: With patient Time For Goal Achievement: 08/08/15 Potential to Achieve Goals: Good    Frequency 7X/week   Barriers to discharge        Co-evaluation               End of Session Equipment Utilized During Treatment: Right knee immobilizer Activity Tolerance: Patient limited by fatigue (limited by dizziness) Patient left: in chair;with call bell/phone within reach           Time: 1434-1510 PT Time Calculation  (min) (ACUTE ONLY): 36 min   Charges:   PT Evaluation $Initial PT Evaluation Tier I: 1 Procedure PT Treatments $Therapeutic Activity: 8-22 mins   PT G Codes:        Rebeca Alert, MPT Pager: [phone]

## 2015-08-01 NOTE — Interval H&P Note (Signed)
History and Physical Interval Note:  08/01/2015 6:49 AM  Susan Carpenter  has presented today for surgery, with the diagnosis of OA OF RIGHT KNEE  The various methods of treatment have been discussed with the patient and family. After consideration of risks, benefits and other options for treatment, the patient has consented to  Procedure(s): RIGHT TOTAL KNEE ARTHROPLASTY (Right) as a surgical intervention .  The patient's history has been reviewed, patient examined, no change in status, stable for surgery.  I have reviewed the patient's chart and labs.  Questions were answered to the patient's satisfaction.     Loanne Drilling

## 2015-08-01 NOTE — Transfer of Care (Signed)
Immediate Anesthesia Transfer of Care Note  Patient: Susan Carpenter  Procedure(s) Performed: Procedure(s): RIGHT TOTAL KNEE ARTHROPLASTY (Right)  Patient Location: PACU  Anesthesia Type:Regional  Level of Consciousness: awake, alert  and oriented  Airway & Oxygen Therapy: Patient Spontanous Breathing and Patient connected to face mask oxygen  Post-op Assessment: Report given to RN and Post -op Vital signs reviewed and stable  Post vital signs: Reviewed and stable  Last Vitals:  Filed Vitals:   08/01/15 0509  BP: 149/80  Pulse: 93  Temp: 36.9 C  Resp: 16    Complications: No apparent anesthesia complications

## 2015-08-01 NOTE — Anesthesia Postprocedure Evaluation (Signed)
  Anesthesia Post-op Note  Patient: Susan Carpenter  Procedure(s) Performed: Procedure(s) (LRB): RIGHT TOTAL KNEE ARTHROPLASTY (Right)  Patient Location: PACU  Anesthesia Type: Spinal  Level of Consciousness: awake and alert   Airway and Oxygen Therapy: Patient Spontanous Breathing  Post-op Pain: mild  Post-op Assessment: Post-op Vital signs reviewed, Patient's Cardiovascular Status Stable, Respiratory Function Stable, Patent Airway and No signs of Nausea or vomiting  Last Vitals:  Filed Vitals:   08/01/15 0830  BP: 127/64  Pulse: 74  Temp:   Resp: 12    Post-op Vital Signs: stable   Complications: No apparent anesthesia complications

## 2015-08-02 LAB — BASIC METABOLIC PANEL
ANION GAP: 7 (ref 5–15)
BUN: 13 mg/dL (ref 6–20)
CALCIUM: 8.4 mg/dL — AB (ref 8.9–10.3)
CO2: 24 mmol/L (ref 22–32)
Chloride: 100 mmol/L — ABNORMAL LOW (ref 101–111)
Creatinine, Ser: 0.7 mg/dL (ref 0.44–1.00)
GFR calc Af Amer: >60 mL/min (ref >60)
GLUCOSE: 119 mg/dL — AB (ref 65–99)
Potassium: 4.3 mmol/L (ref 3.5–5.1)
Sodium: 131 mmol/L — ABNORMAL LOW (ref 135–145)

## 2015-08-02 LAB — CBC
HCT: 34 % — ABNORMAL LOW (ref 36.0–46.0)
Hemoglobin: 11.3 g/dL — ABNORMAL LOW (ref 12.0–15.0)
MCH: 30.7 pg (ref 26.0–34.0)
MCHC: 33.2 g/dL (ref 30.0–36.0)
MCV: 92.4 fL (ref 78.0–100.0)
PLATELETS: 208 10*3/uL (ref 150–400)
RBC: 3.68 MIL/uL — ABNORMAL LOW (ref 3.87–5.11)
RDW: 12.9 % (ref 11.5–15.5)
WBC: 12.4 10*3/uL — AB (ref 4.0–10.5)

## 2015-08-02 MED ORDER — ESOMEPRAZOLE MAGNESIUM 20 MG PO CPDR
20.0000 mg | DELAYED_RELEASE_CAPSULE | Freq: Every day | ORAL | Status: DC
  Administered 2015-08-03 – 2015-08-04 (×2): 20 mg via ORAL
  Filled 2015-08-02 (×3): qty 1

## 2015-08-02 MED ORDER — ESOMEPRAZOLE MAGNESIUM 20 MG PO CPDR
20.0000 mg | DELAYED_RELEASE_CAPSULE | Freq: Every day | ORAL | Status: DC
  Administered 2015-08-02: 20 mg via ORAL
  Filled 2015-08-02: qty 1

## 2015-08-02 MED ORDER — PROMETHAZINE HCL 25 MG/ML IJ SOLN
6.2500 mg | Freq: Four times a day (QID) | INTRAMUSCULAR | Status: DC | PRN
Start: 2015-08-02 — End: 2015-08-04
  Administered 2015-08-02: 6.25 mg via INTRAVENOUS
  Filled 2015-08-02: qty 1

## 2015-08-02 MED ORDER — NON FORMULARY: 20.0000 mg | Freq: Every day | Status: DC

## 2015-08-02 MED ORDER — SODIUM CHLORIDE 0.9 % IV BOLUS (SEPSIS)
250.0000 mL | Freq: Once | INTRAVENOUS | Status: AC
  Administered 2015-08-02: 250 mL via INTRAVENOUS

## 2015-08-02 NOTE — Progress Notes (Signed)
Physical Therapy Treatment Patient Details Name: Susan Carpenter MRN: [id number] DOB: 02-Mar-1946 Today's Date: 08/02/2015    History of Present Illness 69 yo female s/p R TKA 08/01/15    PT Comments    Pt only able to tolerate bed exercises this am. Pt still experiencing nausea. Will attempt OOB later today.   Follow Up Recommendations  Home health PT     Equipment Recommendations  Rolling walker with 5" wheels    Recommendations for Other Services       Precautions / Restrictions Precautions Precautions: Knee Required Braces or Orthoses: Knee Immobilizer - Right Restrictions Weight Bearing Restrictions: No RLE Weight Bearing: Weight bearing as tolerated    Mobility  Bed Mobility             General bed mobility comments: NT-pt requested PT attempt OOB later this pm  Transfers     Ambulation/Gait                 Stairs            Wheelchair Mobility    Modified Rankin (Stroke Patients Only)       Balance                                    Cognition Arousal/Alertness: Awake/alert Behavior During Therapy: WFL for tasks assessed/performed Overall Cognitive Status: Within Functional Limits for tasks assessed                      Exercises Total Joint Exercises Ankle Circles/Pumps: AROM;Both;10 reps;Supine Quad Sets: AROM;Both;10 reps;Supine Heel Slides: AAROM;Right;10 reps;Supine Hip ABduction/ADduction: AAROM;Right;10 reps;Supine Straight Leg Raises: AAROM;Right;10 reps;Supine Goniometric ROM: ~10-50 degree    General Comments        Pertinent Vitals/Pain Pain Assessment: 0-10 Pain Score: 5  Pain Location: R knee Pain Descriptors / Indicators: Sore;Aching Pain Intervention(s): Monitored during session;Ice applied;Repositioned    Home Living Family/patient expects to be discharged to:: Private residence Living Arrangements: Spouse/significant other           Home Equipment: None      Prior  Function Level of Independence: Independent          PT Goals (current goals can now be found in the care plan section) Acute Rehab PT Goals Patient Stated Goal: to feel better Progress towards PT goals: Progressing toward goals    Frequency  7X/week    PT Plan Current plan remains appropriate    Co-evaluation             End of Session Equipment Utilized During Treatment: Right knee immobilizer Activity Tolerance: Patient limited by fatigue (limited by nausea) Patient left: in bed;with call bell/phone within reach     Time: 4098-1191 PT Time Calculation (min) (ACUTE ONLY): 16 min  Charges:  $Therapeutic Exercise: 8-22 mins                    G Codes:      Rebeca Alert, MPT Pager: [phone]

## 2015-08-02 NOTE — Progress Notes (Signed)
   Subjective: 1 Day Post-Op Procedure(s) (LRB): RIGHT TOTAL KNEE ARTHROPLASTY (Right) Patient reports pain as mild.   Patient seen in rounds with Dr. Lequita Halt.  Had some nausea yesterday and better this morning after medication changed. Patient is well, but has had some minor complaints of pain in the knee, requiring pain medications We will resume therapy today. Attempted OOB yesterday with therapy but experienced some dizziness. Plan is to go Home after hospital stay.  Objective: Vital signs in last 24 hours: Temp:  [97.4 F (36.3 C)-98.4 F (36.9 C)] 97.7 F (36.5 C) (09/27 0549) Pulse Rate:  [54-83] 72 (09/27 0549) Resp:  [11-18] 16 (09/27 0549) BP: (96-146)/(42-75) 139/65 mmHg (09/27 0549) SpO2:  [96 %-100 %] 98 % (09/27 0549)  Intake/Output from previous day:  Intake/Output Summary (Last 24 hours) at 08/02/15 0810 Last data filed at 08/02/15 0600  Gross per 24 hour  Intake 3192.5 ml  Output   2450 ml  Net  742.5 ml    Intake/Output this shift: UOP 550 since around [place]  Labs:  Recent Labs  08/02/15 0508  HGB 11.3*    Recent Labs  08/02/15 0508  WBC 12.4*  RBC 3.68*  HCT 34.0*  PLT 208    Recent Labs  08/02/15 0508  NA 131*  K 4.3  CL 100*  CO2 24  BUN 13  CREATININE 0.70  GLUCOSE 119*  CALCIUM 8.4*   No results for input(s): LABPT, INR in the last 72 hours.  EXAM General - Patient is Alert, Appropriate and Oriented Extremity - Neurovascular intact Sensation intact distally Dorsiflexion/Plantar flexion intact Dressing - dressing C/D/I Motor Function - intact, moving foot and toes well on exam.  Hemovac pulled without difficulty.  Past Medical History  Diagnosis Date  . Helicobacter pylori (H. pylori) infection     s/p treatment  . ANA positive   . Hiatal hernia   . Allergy     rhinitis  . Pain in joint     site unspecified  . Arthritis   . GERD (gastroesophageal reflux disease)   . Interstitial cystitis   . Complication of  anesthesia   . PONV (postoperative nausea and vomiting)   . Heart murmur   . Psoriatic arthritis   . Fibromyalgia   . Psoriasis   . Constipation     Assessment/Plan: 1 Day Post-Op Procedure(s) (LRB): RIGHT TOTAL KNEE ARTHROPLASTY (Right) Principal Problem:   OA (osteoarthritis) of knee  Estimated body mass index is 31.12 kg/(m^2) as calculated from the following:   Height as of this encounter:  (1.651 m).   Weight as of this encounter: 84.823 kg (187 lb). Advance diet Up with therapy Plan for discharge tomorrow Discharge home with home health  DVT Prophylaxis - Xarelto Weight-Bearing as tolerated to right leg D/C O2 and Pulse OX and try on Room Air  Avel Peace, PA-C Orthopaedic Surgery 08/02/2015, 8:10 AM

## 2015-08-02 NOTE — Care Management Note (Addendum)
Case Management Note  Patient Details  Name: Susan Carpenter MRN: [id number] Date of Birth: [birth year]  Subjective/Objective:                   RIGHT TOTAL KNEE ARTHROPLASTY (Right)  Action/Plan:  Discharge planning Expected Discharge Date:  08/03/15               Expected Discharge Plan:  [place]  In-House Referral:     Discharge planning Services  CM Consult  Post Acute Care Choice:  Home Health Choice offered to:  Patient  DME Arranged:  3-N-1, Walker rolling DME Agency:  [place]:  PT, OT HH Agency:  [facility]  Status of Service:  Completed, signed off  Medicare Important Message Given:    Date Medicare IM Given:    Medicare IM give by:    Date Additional Medicare IM Given:    Additional Medicare Important Message give by:     If discussed at [place] of Stay Meetings, dates discussed:    Additional Comments: 13:30 CM met with pt and spouse in room and called the [facility] customer service phone for explanation of benefit. [employer] rep, Gerald Stabs explained the maximum benefit of 35hours of HHRN care which needs to be ordered by the MD.  Family does not verbalize understanding the HHPT/OT/aide order will collectively be less than 10 hours of active care per week.  Pt and spouse are adamant in their understanding they have been told by insurance and MD, they will have 35 hours of care.RN made aware; and PA was called by RN.  Referral amended to reflect HHPT/OT/Aide.  No other CM needs were communicated.  11:17Cm met with pt in room to offer choice of home health agency.  Pt chooses [facility] to render HHPT. Address and contact information verified by pt.  Referral called to [employer], Tim.  HHOT has been recc. And RN aware to secure order.  Pt understood a nurse would be going home with her and I explained this surgery does not meet criteria for insurance to cover an RN but gave pt a [employer] List of agencies which  provide home services.  Pt verbalized understanding these services from the [place] List are an out-of-pocket expense, are approx $ 25-30/hour, and would have to be arranged by pt.  CM called [facility] DME rep, Lecretia to please deliver the rolling walker and 3n1 to room prior to discharge.  No other CM needs were communicated. Dellie Catholic, RN 08/02/2015, 11:17 AM

## 2015-08-02 NOTE — Progress Notes (Signed)
Physical Therapy Treatment Patient Details Name: Susan Carpenter MRN: [id number] DOB: 1946-08-12 Today's Date: 08/02/2015    History of Present Illness 69 yo female s/p R TKA 08/01/15    PT Comments    Progressing with mobility. Pt feeling much better this afternoon.   Follow Up Recommendations  Home health PT     Equipment Recommendations  Rolling walker with 5" wheels    Recommendations for Other Services       Precautions / Restrictions Precautions Precautions: Knee Required Braces or Orthoses: Knee Immobilizer - Left Knee Immobilizer - Right: Discontinue once straight leg raise with < 10 degree lag Restrictions Weight Bearing Restrictions: No RLE Weight Bearing: Weight bearing as tolerated    Mobility  Bed Mobility Overal bed mobility: Needs Assistance Bed Mobility: Supine to Sit;Sit to Supine     Supine to sit: Min assist Sit to supine: Min assist   General bed mobility comments: Assist for R LE  Transfers Overall transfer level: Needs assistance Equipment used: Rolling walker (2 wheeled) Transfers: Sit to/from Stand Sit to Stand: Min assist         General transfer comment: assist to rise, stabilize, control descent. VCS safety, hand placement  Ambulation/Gait Ambulation/Gait assistance: Min guard Ambulation Distance (Feet): 75 Feet Assistive device: Rolling walker (2 wheeled) Gait Pattern/deviations: Step-to pattern     General Gait Details: close guard for safety. VCs safety, distance, sequence.    Stairs            Wheelchair Mobility    Modified Rankin (Stroke Patients Only)       Balance                                    Cognition Arousal/Alertness: Awake/alert Behavior During Therapy: WFL for tasks assessed/performed Overall Cognitive Status: Within Functional Limits for tasks assessed                      Exercises    General Comments        Pertinent Vitals/Pain Pain Assessment:  0-10 Pain Score: 5  Pain Location: R knee Pain Descriptors / Indicators: Aching;Sore Pain Intervention(s): Monitored during session;Ice applied;Repositioned    Home Living                      Prior Function            PT Goals (current goals can now be found in the care plan section) Progress towards PT goals: Progressing toward goals    Frequency  7X/week    PT Plan Current plan remains appropriate    Co-evaluation             End of Session Equipment Utilized During Treatment: Right knee immobilizer Activity Tolerance: Patient tolerated treatment well Patient left: in bed;with call bell/phone within reach     Time: 1346-1409 PT Time Calculation (min) (ACUTE ONLY): 23 min  Charges:  $Gait Training: 23-37 mins $Therapeutic Exercise: 8-22 mins                    G Codes:      Rebeca Alert, MPT Pager: [phone]

## 2015-08-02 NOTE — Evaluation (Signed)
Occupational Therapy Evaluation Patient Details Name: Susan Carpenter MRN: [id number] DOB: 1946/02/14 Today's Date: 08/02/2015    History of Present Illness 69 yo female s/p R TKA 08/01/15   Clinical Impression   Pt was admitted for the above. She was limited by nausea at time of eval. She will benefit from skilled OT to increase safety and independence with adls. Goals in acute are for min guard to supervision level overall    Follow Up Recommendations  Home health OT;No OT follow up;Supervision/Assistance - 24 hour (vs and)    Equipment Recommendations  3 in 1 bedside comode    Recommendations for Other Services       Precautions / Restrictions Precautions Precautions: Knee Required Braces or Orthoses: Knee Immobilizer - Right Restrictions Weight Bearing Restrictions: No      Mobility Bed Mobility   Bed Mobility: Supine to Sit     Supine to sit: Min assist     General bed mobility comments: assist for R LE  Transfers   Equipment used: Rolling walker (2 wheeled) Transfers: Sit to/from Stand Sit to Stand: Min assist         General transfer comment: assist for RLE    Balance                                            ADL Overall ADL's : Needs assistance/impaired                                       General ADL Comments: pt sat EOB for ADL but became very nauseous.  Only able to tolerate washing face and having back washed.  Returned to supine after 5 minutes without nausea improving.  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     Vision     Perception     Praxis      Pertinent Vitals/Pain Pain Assessment: 0-10 Pain Score: 2  Pain Location: R knee Pain Descriptors / Indicators: Sore Pain Intervention(s): Limited activity within patient's tolerance;Monitored during session;Premedicated before session;Repositioned (ice removed)     Hand Dominance      Extremity/Trunk Assessment Upper Extremity Assessment Upper Extremity Assessment: Overall WFL for tasks assessed           Communication Communication Communication: No difficulties   Cognition Arousal/Alertness: Awake/alert Behavior During Therapy: WFL for tasks assessed/performed Overall Cognitive Status: Within Functional Limits for tasks assessed                     General Comments       Exercises       Shoulder Instructions      Home Living Family/patient expects to be discharged to:: Private residence Living Arrangements: Spouse/significant other                 Bathroom Shower/Tub: Producer, television/film/video: Standard     Home Equipment: None          Prior Functioning/Environment Level of Independence: Independent             OT Diagnosis: Generalized weakness   OT Problem List: Decreased strength;Decreased activity tolerance;Decreased knowledge of use of DME or AE;Pain   OT Treatment/Interventions: Self-care/ADL training;DME  and/or AE instruction;Patient/family education    OT Goals(Current goals can be found in the care plan section) Acute Rehab OT Goals Patient Stated Goal: to feel better OT Goal Formulation: With patient Time For Goal Achievement: 08/09/15 Potential to Achieve Goals: Good ADL Goals Pt Will Perform Grooming: with supervision;standing Pt Will Transfer to Toilet: with min guard assist;stand pivot transfer;ambulating;bedside commode Pt Will Perform Toileting - Clothing Manipulation and hygiene: with min guard assist;sit to/from stand Pt Will Perform Tub/Shower Transfer: Shower transfer;with min guard assist;3 in 1;ambulating Additional ADL Goal #1: pt will verbalize AE vs demonstrate wtih supervision for adls  OT Frequency: Min 2X/week   Barriers to D/C:            Co-evaluation              End of Session CPM Right Knee CPM Right Knee: Off  Activity Tolerance:  (limited by  nausea) Patient left: in bed;with call bell/phone within reach   Time: 0832-0858 OT Time Calculation (min): 26 min Charges:  OT General Charges $OT Visit: 1 Procedure OT Evaluation $Initial OT Evaluation Tier I: 1 Procedure G-Codes:    SPENCER,MARYELLEN 09-01-2015, 9:06 AM  Marica Otter, OTR/L [phone] 2015/09/01

## 2015-08-03 LAB — CBC
HCT: 32.3 % — ABNORMAL LOW (ref 36.0–46.0)
Hemoglobin: 10.7 g/dL — ABNORMAL LOW (ref 12.0–15.0)
MCH: 30.3 pg (ref 26.0–34.0)
MCHC: 33.1 g/dL (ref 30.0–36.0)
MCV: 91.5 fL (ref 78.0–100.0)
PLATELETS: 210 10*3/uL (ref 150–400)
RBC: 3.53 MIL/uL — ABNORMAL LOW (ref 3.87–5.11)
RDW: 13.1 % (ref 11.5–15.5)
WBC: 14 10*3/uL — AB (ref 4.0–10.5)

## 2015-08-03 LAB — TYPE AND SCREEN
ABO/RH(D): O POS
Antibody Screen: POSITIVE
DONOR AG TYPE: NEGATIVE
Donor AG Type: NEGATIVE
UNIT DIVISION: 0
Unit division: 0

## 2015-08-03 LAB — BASIC METABOLIC PANEL
ANION GAP: 5 (ref 5–15)
BUN: 13 mg/dL (ref 6–20)
CALCIUM: 8.4 mg/dL — AB (ref 8.9–10.3)
CO2: 29 mmol/L (ref 22–32)
CREATININE: 0.7 mg/dL (ref 0.44–1.00)
Chloride: 102 mmol/L (ref 101–111)
GFR calc Af Amer: >60 mL/min (ref >60)
GLUCOSE: 113 mg/dL — AB (ref 65–99)
Potassium: 3.9 mmol/L (ref 3.5–5.1)
Sodium: 136 mmol/L (ref 135–145)

## 2015-08-03 MED ORDER — RIVAROXABAN 10 MG PO TABS: 10.0000 mg | ORAL_TABLET | Freq: Every day | ORAL | Status: DC

## 2015-08-03 MED ORDER — TRAMADOL HCL 50 MG PO TABS: 50.0000 mg | ORAL_TABLET | Freq: Four times a day (QID) | ORAL | Status: DC | PRN

## 2015-08-03 MED ORDER — HYDROMORPHONE HCL 2 MG PO TABS: 2.0000 mg | ORAL_TABLET | ORAL | Status: DC | PRN

## 2015-08-03 MED ORDER — CYCLOBENZAPRINE HCL 10 MG PO TABS
10.0000 mg | ORAL_TABLET | Freq: Three times a day (TID) | ORAL | Status: DC | PRN
  Administered 2015-08-03 – 2015-08-04 (×3): 10 mg via ORAL
  Filled 2015-08-03 (×3): qty 1

## 2015-08-03 MED ORDER — CYCLOBENZAPRINE HCL 10 MG PO TABS: 10.0000 mg | ORAL_TABLET | Freq: Three times a day (TID) | ORAL | Status: DC | PRN

## 2015-08-03 NOTE — Discharge Instructions (Signed)
° °Dr. Frank Aluisio °[facility]ist °[facility] °[address] °[place], [place] [address] °[phone] ° °TOTAL KNEE REPLACEMENT POSTOPERATIVE DIRECTIONS ° °Knee Rehabilitation, Guidelines Following Surgery  °Results after knee surgery are often greatly improved when you follow the exercise, range of motion and muscle strengthening exercises prescribed by your doctor. Safety measures are also important to protect the knee from further injury. Any time any of these exercises cause you to have increased pain or swelling in your knee joint, decrease the amount until you are comfortable again and slowly increase them. If you have problems or questions, call your caregiver or physical therapist for advice.  ° °HOME CARE INSTRUCTIONS  °Remove items at home which could result in a fall. This includes throw rugs or furniture in walking pathways.  °· ICE to the affected knee every three hours for 30 minutes at a time and then as needed for pain and swelling.  Continue to use ice on the knee for pain and swelling from surgery. You may notice swelling that will progress down to the foot and ankle.  This is normal after surgery.  Elevate the leg when you are not up walking on it.   °· Continue to use the breathing machine which will help keep your temperature down.  It is common for your temperature to cycle up and down following surgery, especially at night when you are not up moving around and exerting yourself.  The breathing machine keeps your lungs expanded and your temperature down. °· Do not place pillow under knee, focus on keeping the knee straight while resting ° °DIET °You may resume your previous home diet once your are discharged from the hospital. ° °DRESSING / WOUND CARE / SHOWERING °You may shower 3 days after surgery, but keep the wounds dry during showering.  You may use an occlusive plastic wrap (Press'n Seal for example), NO SOAKING/SUBMERGING IN THE BATHTUB.  If the  bandage gets wet, change with a clean dry gauze.  If the incision gets wet, pat the wound dry with a clean towel. °You may start showering once you are discharged home but do not submerge the incision under water. Just pat the incision dry and apply a dry gauze dressing on daily. °Change the surgical dressing daily and reapply a dry dressing each time. ° °ACTIVITY °Walk with your walker as instructed. °Use walker as long as suggested by your caregivers. °Avoid periods of inactivity such as sitting longer than an hour when not asleep. This helps prevent blood clots.  °You may resume a sexual relationship in one month or when given the OK by your doctor.  °You may return to work once you are cleared by your doctor.  °Do not drive a car for 6 weeks or until released by you surgeon.  °Do not drive while taking narcotics. ° °WEIGHT BEARING °Weight bearing as tolerated with assist device (walker, cane, etc) as directed, use it as long as suggested by your surgeon or therapist, typically at least 4-6 weeks. ° °POSTOPERATIVE CONSTIPATION PROTOCOL °Constipation - defined medically as fewer than three stools per week and severe constipation as less than one stool per week. ° °One of the most common issues patients have following surgery is constipation.  Even if you have a regular bowel pattern at home, your normal regimen is likely to be disrupted due to multiple reasons following surgery.  Combination of anesthesia, postoperative narcotics, change in appetite and fluid intake all can affect your bowels.    In order to avoid complications following surgery, here are some recommendations in order to help you during your recovery period. ° °Colace (docusate) - Pick up an over-the-counter form of Colace or another stool softener and take twice a day as long as you are requiring postoperative pain medications.  Take with a full glass of water daily.  If you experience loose stools or diarrhea, hold the colace until you stool forms  back up.  If your symptoms do not get better within 1 week or if they get worse, check with your doctor. ° °Dulcolax (bisacodyl) - Pick up over-the-counter and take as directed by the product packaging as needed to assist with the movement of your bowels.  Take with a full glass of water.  Use this product as needed if not relieved by Colace only.  ° °MiraLax (polyethylene glycol) - Pick up over-the-counter to have on hand.  MiraLax is a solution that will increase the amount of water in your bowels to assist with bowel movements.  Take as directed and can mix with a glass of water, juice, soda, coffee, or tea.  Take if you go more than two days without a movement. °Do not use MiraLax more than once per day. Call your doctor if you are still constipated or irregular after using this medication for 7 days in a row. ° °If you continue to have problems with postoperative constipation, please contact the office for further assistance and recommendations.  If you experience "the worst abdominal pain ever" or develop nausea or vomiting, please contact the office immediatly for further recommendations for treatment. ° °ITCHING ° If you experience itching with your medications, try taking only a single pain pill, or even half a pain pill at a time.  You can also use Benadryl over the counter for itching or also to help with sleep.  ° °TED HOSE STOCKINGS °Wear the elastic stockings on both legs for three weeks following surgery during the day but you may remove then at night for sleeping. ° °MEDICATIONS °See your medication summary on the “After Visit Summary” that the nursing staff will review with you prior to discharge.  You may have some home medications which will be placed on hold until you complete the course of blood thinner medication.  It is important for you to complete the blood thinner medication as prescribed by your surgeon.  Continue your approved medications as instructed at time of  discharge. ° °PRECAUTIONS °If you experience chest pain or shortness of breath - call 911 immediately for transfer to the hospital emergency department.  °If you develop a fever greater that 101 F, purulent drainage from wound, increased redness or drainage from wound, foul odor from the wound/dressing, or calf pain - CONTACT YOUR SURGEON.   °                                                °FOLLOW-UP APPOINTMENTS °Make sure you keep all of your appointments after your operation with your surgeon and caregivers. You should call the office at the above phone number and make an appointment for approximately two weeks after the date of your surgery or on the date instructed by your surgeon outlined in the "After Visit Summary". ° ° °RANGE OF MOTION AND STRENGTHENING EXERCISES  °Rehabilitation of the knee is important following a knee injury or   an operation. After just a few days of immobilization, the muscles of the thigh which control the knee become weakened and shrink (atrophy). Knee exercises are designed to build up the tone and strength of the thigh muscles and to improve knee motion. Often times heat used for twenty to thirty minutes before working out will loosen up your tissues and help with improving the range of motion but do not use heat for the first two weeks following surgery. These exercises can be done on a training (exercise) mat, on the floor, on a table or on a bed. Use what ever works the best and is most comfortable for you Knee exercises include:  °Leg Lifts - While your knee is still immobilized in a splint or cast, you can do straight leg raises. Lift the leg to 60 degrees, hold for 3 sec, and slowly lower the leg. Repeat 10-20 times 2-3 times daily. Perform this exercise against resistance later as your knee gets better.  °Quad and Hamstring Sets - Tighten up the muscle on the front of the thigh (Quad) and hold for 5-10 sec. Repeat this 10-20 times hourly. Hamstring sets are done by pushing the  foot backward against an object and holding for 5-10 sec. Repeat as with quad sets.  °· Leg Slides: Lying on your back, slowly slide your foot toward your buttocks, bending your knee up off the floor (only go as far as is comfortable). Then slowly slide your foot back down until your leg is flat on the floor again. °· Angel Wings: Lying on your back spread your legs to the side as far apart as you can without causing discomfort.  °A rehabilitation program following serious knee injuries can speed recovery and prevent re-injury in the future due to weakened muscles. Contact your doctor or a physical therapist for more information on knee rehabilitation.  ° °IF YOU ARE TRANSFERRED TO A SKILLED REHAB FACILITY °If the patient is transferred to a skilled rehab facility following release from the hospital, a list of the current medications will be sent to the facility for the patient to continue.  When discharged from the skilled rehab facility, please have the facility set up the patient's Home Health Physical Therapy prior to being released. Also, the skilled facility will be responsible for providing the patient with their medications at time of release from the facility to include their pain medication, the muscle relaxants, and their blood thinner medication. If the patient is still at the rehab facility at time of the two week follow up appointment, the skilled rehab facility will also need to assist the patient in arranging follow up appointment in our office and any transportation needs. ° °MAKE SURE YOU:  °Understand these instructions.  °Get help right away if you are not doing well or get worse.  ° ° °Pick up stool softner and laxative for home use following surgery while on pain medications. °Do not submerge incision under water. °Please use good hand washing techniques while changing dressing each day. °May shower starting three days after surgery. °Please use a clean towel to pat the incision dry following  showers. °Continue to use ice for pain and swelling after surgery. °Do not use any lotions or creams on the incision until instructed by your surgeon. ° °Take Xarelto for two and a half more weeks, then discontinue Xarelto. °Once the patient has completed the blood thinner regimen, then take a Baby 81 mg Aspirin daily for three more weeks. ° ° °Information   on my medicine - XARELTO® (Rivaroxaban) ° °This medication education was reviewed with me or my healthcare representative as part of my discharge preparation.  The pharmacist that spoke with me during my hospital stay was:  Pickering, Thomas Robert, RPH ° °Why was Xarelto® prescribed for you? °Xarelto® was prescribed for you to reduce the risk of blood clots forming after orthopedic surgery. The medical term for these abnormal blood clots is venous thromboembolism (VTE). ° °What do you need to know about xarelto® ? °Take your Xarelto® ONCE DAILY at the same time every day. °You may take it either with or without food. ° °If you have difficulty swallowing the tablet whole, you may crush it and mix in applesauce just prior to taking your dose. ° °Take Xarelto® exactly as prescribed by your doctor and DO NOT stop taking Xarelto® without talking to the doctor who prescribed the medication.  Stopping without other VTE prevention medication to take the place of Xarelto® may increase your risk of developing a clot. ° °After discharge, you should have regular check-up appointments with your healthcare provider that is prescribing your Xarelto®.   ° °What do you do if you miss a dose? °If you miss a dose, take it as soon as you remember on the same day then continue your regularly scheduled once daily regimen the next day. Do not take two doses of Xarelto® on the same day.  ° °Important Safety Information °A possible side effect of Xarelto® is bleeding. You should call your healthcare provider right away if you experience any of the following: °? Bleeding from an injury or  your nose that does not stop. °? Unusual colored urine (red or dark brown) or unusual colored stools (red or black). °? Unusual bruising for unknown reasons. °? A serious fall or if you hit your head (even if there is no bleeding). ° °Some medicines may interact with Xarelto® and might increase your risk of bleeding while on Xarelto®. To help avoid this, consult your healthcare provider or pharmacist prior to using any new prescription or non-prescription medications, including herbals, vitamins, non-steroidal anti-inflammatory drugs (NSAIDs) and supplements. ° °This website has more information on Xarelto®: www.xarelto.com. ° ° ° °

## 2015-08-03 NOTE — Progress Notes (Signed)
Physical Therapy Treatment Patient Details Name: Susan Carpenter MRN: [id number] DOB: October 02, 1946 Today's Date: 08/03/2015    History of Present Illness 69 yo female s/p R TKA 08/01/15    PT Comments    Attempted to practice ambulation and stair negotiation this session. On arrival, pt walking out of bathroom with NA. Seated pt at EOB to don KI. After KI donned, pt c/o feeling like she was going to pass out and requested to return to supine. Assessed BP: 122/58. Made RN aware. If schedule permits, will try to return to mobilize/practice stair training.   Follow Up Recommendations  Home health PT;Supervision/Assistance - 24 hour     Equipment Recommendations  Rolling walker with 5" wheels    Recommendations for Other Services       Precautions / Restrictions Precautions Precautions: Knee Required Braces or Orthoses: Knee Immobilizer - Right Knee Immobilizer - Right: Discontinue once straight leg raise with < 10 degree lag Restrictions Weight Bearing Restrictions: No RLE Weight Bearing: Weight bearing as tolerated    Mobility  Bed Mobility Overal bed mobility: Needs Assistance Bed Mobility: Sit to Supine       Sit to supine: Min assist   General bed mobility comments: Assist for R LE  Transfers Overall transfer level: Needs assistance Equipment used: Rolling walker (2 wheeled) Transfers: Sit to/from Stand Sit to Stand: Min guard Stand pivot transfers: Min guard       General transfer comment: close guard for safety. VC safety, technique, hand placement  Ambulation/Gait Ambulation/Gait assistance: Min guard Ambulation Distance (Feet): 80 Feet Assistive device: Rolling walker (2 wheeled) Gait Pattern/deviations: Step-to pattern;Antalgic     General Gait Details: close guard for safety. VCs safety, distance, sequence.    Stairs            Wheelchair Mobility    Modified Rankin (Stroke Patients Only)       Balance                                    Cognition Arousal/Alertness: Awake/alert Behavior During Therapy: WFL for tasks assessed/performed Overall Cognitive Status: Within Functional Limits for tasks assessed                      Exercises     General Comments        Pertinent Vitals/Pain Pain Assessment: 0-10 Pain Score: 5  Pain Location: R knee Pain Descriptors / Indicators: Sore;Spasm;Cramping Pain Intervention(s): Monitored during session;Repositioned    Home Living                      Prior Function            PT Goals (current goals can now be found in the care plan section) Progress towards PT goals: Progressing toward goals    Frequency  7X/week    PT Plan Current plan remains appropriate    Co-evaluation             End of Session Equipment Utilized During Treatment: Right knee immobilizer Activity Tolerance:  (Pt c/o feeling faint so did nto ambulate/practice steps this session) Patient left: in bed;with call bell/phone within reach     Time: 1425-1434 PT Time Calculation (min) (ACUTE ONLY): 9 min  Charges:  $Gait Training: 8-22 mins $Therapeutic Exercise: 8-22 mins $Therapeutic Activity: 8-22 mins  G Codes:      Weston Anna, MPT Pager: [phone]

## 2015-08-03 NOTE — Progress Notes (Signed)
   Subjective: 2 Days Post-Op Procedure(s) (LRB): RIGHT TOTAL KNEE ARTHROPLASTY (Right) Patient reports pain as moderate.   Patient seen in rounds by Dr. Lequita Halt.  Having a lot of spasms.  Switched medication this morning and will see how she does.  If she progresses and is doing better, then home later today. Patient is having problems with pain in the knee and spasms in the leg, requiring pain medications Patient might be ready to go home later today if she improves and does well with PT and new med change helps.  Objective: Vital signs in last 24 hours: Temp:  [98 F (36.7 C)-98.2 F (36.8 C)] 98.2 F (36.8 C) (09/28 0432) Pulse Rate:  [65-80] 80 (09/28 0432) Resp:  [16] 16 (09/28 0432) BP: (140-161)/(70-80) 161/80 mmHg (09/28 0432) SpO2:  [94 %-100 %] 94 % (09/28 0432)  Intake/Output from previous day:  Intake/Output Summary (Last 24 hours) at 08/03/15 0939 Last data filed at 08/02/15 1928  Gross per 24 hour  Intake 1659.75 ml  Output   4150 ml  Net -2490.25 ml    Labs:  Recent Labs  08/02/15 0508 08/03/15 0535  HGB 11.3* 10.7*    Recent Labs  08/02/15 0508 08/03/15 0535  WBC 12.4* 14.0*  RBC 3.68* 3.53*  HCT 34.0* 32.3*  PLT 208 210    Recent Labs  08/02/15 0508 08/03/15 0535  NA 131* 136  K 4.3 3.9  CL 100* 102  CO2 24 29  BUN 13 13  CREATININE 0.70 0.70  GLUCOSE 119* 113*  CALCIUM 8.4* 8.4*   No results for input(s): LABPT, INR in the last 72 hours.  EXAM: General - Patient is Alert and Appropriate Extremity - Neurovascular intact Sensation intact distally Dorsiflexion/Plantar flexion intact Incision - clean, dry, no drainage Motor Function - intact, moving foot and toes well on exam.   Assessment/Plan: 2 Days Post-Op Procedure(s) (LRB): RIGHT TOTAL KNEE ARTHROPLASTY (Right) Procedure(s) (LRB): RIGHT TOTAL KNEE ARTHROPLASTY (Right) Past Medical History  Diagnosis Date  . Helicobacter pylori (H. pylori) infection     s/p treatment    . ANA positive   . Hiatal hernia   . Allergy     rhinitis  . Pain in joint     site unspecified  . Arthritis   . GERD (gastroesophageal reflux disease)   . Interstitial cystitis   . Complication of anesthesia   . PONV (postoperative nausea and vomiting)   . Heart murmur   . Psoriatic arthritis   . Fibromyalgia   . Psoriasis   . Constipation    Principal Problem:   OA (osteoarthritis) of knee  Estimated body mass index is 31.12 kg/(m^2) as calculated from the following:   Height as of this encounter:  (1.651 m).   Weight as of this encounter: 84.823 kg (187 lb). Up with therapy Discharge home with home health if improves Diet - Regular diet Follow up - in 2 weeks Activity - WBAT Disposition - Home Condition Upon Discharge - Pending at this time. Will see how she does today with PT D/C Meds - See DC Summary DVT Prophylaxis - Xarelto  Avel Peace, PA-C Orthopaedic Surgery 08/03/2015, 9:39 AM

## 2015-08-03 NOTE — Progress Notes (Signed)
Occupational Therapy Treatment Patient Details Name: Susan Carpenter MRN: [id number] DOB: 18-Aug-1946 Today's Date: 08/03/2015    History of present illness 69 yo female s/p R TKA 08/01/15   OT comments  Pt able to complete ADL and used AE on LLE--R knee too painful.  Got up into chair  Follow Up Recommendations  Home health OT;Supervision/Assistance - 24 hour    Equipment Recommendations  3 in 1 bedside comode (delivered)    Recommendations for Other Services      Precautions / Restrictions Precautions Precautions: Knee Required Braces or Orthoses: Knee Immobilizer - Right Knee Immobilizer - Right: Discontinue once straight leg raise with < 10 degree lag Restrictions Weight Bearing Restrictions: No RLE Weight Bearing: Weight bearing as tolerated       Mobility Bed Mobility Overal bed mobility: Needs Assistance Bed Mobility: Sit to Supine       Sit to supine: Min assist   General bed mobility comments: Assist for R LE  Transfers Overall transfer level: Needs assistance Equipment used: Rolling walker (2 wheeled) Transfers: Sit to/from Stand Sit to Stand: Min guard Stand pivot transfers: Min guard       General transfer comment: close guard for safety. VC safety, technique, hand placement    Balance                                   ADL Overall ADL's : Needs assistance/impaired             Lower Body Bathing: Minimal assistance;Sit to/from stand;With adaptive equipment       Lower Body Dressing: Minimal assistance;Sit to/from stand;With adaptive equipment   Toilet Transfer: Minimal assistance;Stand-pivot (recliner)             General ADL Comments: pt able to complete ADL today with AE (reacher and sock aide).  Pt only used AE on LLE due to increased pain and spasms with R. Cannot lift RLE against gravity and did not want to try to hook L under R      Vision                     Perception     Praxis       Cognition   Behavior During Therapy: [facility] for tasks assessed/performed Overall Cognitive Status: Within Functional Limits for tasks assessed                       Extremity/Trunk Assessment               Exercises    Shoulder Instructions       General Comments      Pertinent Vitals/ Pain       Pain Assessment: 0-10 Pain Score: 5  Pain Location: R knee Pain Descriptors / Indicators: Sore;Spasm;Cramping Pain Intervention(s): Monitored during session;Repositioned  Home Living                                          Prior Functioning/Environment              Frequency Min 2X/week     Progress Toward Goals  OT Goals(current goals can now be found in the care plan section)  Progress towards OT goals: Progressing toward goals     Plan Discharge plan needs  to be updated    Co-evaluation                 End of Session     Activity Tolerance Patient tolerated treatment well   Patient Left in chair;with call bell/phone within reach   Nurse Communication          Time: 9604-5409 OT Time Calculation (min): 28 min  Charges: OT General Charges $OT Visit: 1 Procedure OT Treatments $Self Care/Home Management : 23-37 mins  SPENCER,MARYELLEN 08/03/2015, 3:20 PM   Marica Otter, OTR/L [phone] 08/03/2015

## 2015-08-03 NOTE — Progress Notes (Signed)
Physical Therapy Treatment Patient Details Name: Susan Carpenter MRN: [id number] DOB: [birth year] Today's Date: 08/03/2015    History of Present Illness 69 yo female s/p R TKA 08/01/15    PT Comments    Progressing slowly with mobility.   Follow Up Recommendations  Home health PT     Equipment Recommendations  Rolling walker with 5" wheels    Recommendations for Other Services       Precautions / Restrictions Precautions Precautions: Knee Required Braces or Orthoses: Knee Immobilizer - Left Knee Immobilizer - Right: Discontinue once straight leg raise with < 10 degree lag Restrictions Weight Bearing Restrictions: No RLE Weight Bearing: Weight bearing as tolerated    Mobility  Bed Mobility Overal bed mobility: Needs Assistance Bed Mobility: Sit to Supine       Sit to supine: Min assist   General bed mobility comments: Assist for R LE  Transfers Overall transfer level: Needs assistance Equipment used: Rolling walker (2 wheeled) Transfers: Sit to/from Stand Sit to Stand: Min guard         General transfer comment: close guard for safety. VC safety, technique, hand placement  Ambulation/Gait Ambulation/Gait assistance: Min guard Ambulation Distance (Feet): 80 Feet Assistive device: Rolling walker (2 wheeled) Gait Pattern/deviations: Step-to pattern;Antalgic     General Gait Details: close guard for safety. VCs safety, distance, sequence.    Stairs            Wheelchair Mobility    Modified Rankin (Stroke Patients Only)       Balance                                    Cognition Arousal/Alertness: Awake/alert Behavior During Therapy: WFL for tasks assessed/performed Overall Cognitive Status: Within Functional Limits for tasks assessed                      Exercises Total Joint Exercises Ankle Circles/Pumps: AROM;Both;10 reps;Supine Quad Sets: AROM;Both;10 reps;Supine Heel Slides: AAROM;Right;10 reps;Supine Hip  ABduction/ADduction: AAROM;Right;10 reps;Supine Straight Leg Raises: AAROM;Right;10 reps;Supine Goniometric ROM: ~10-45 degrees (limited by pain this session)    General Comments        Pertinent Vitals/Pain Pain Assessment: 0-10 Pain Score: 6  Pain Location: R knee Pain Descriptors / Indicators: Aching;Sore Pain Intervention(s): Monitored during session;Repositioned;Ice applied    Home Living                      Prior Function            PT Goals (current goals can now be found in the care plan section) Progress towards PT goals: Progressing toward goals    Frequency  7X/week    PT Plan Current plan remains appropriate    Co-evaluation             End of Session Equipment Utilized During Treatment: Right knee immobilizer Activity Tolerance: Patient tolerated treatment well Patient left: in bed;with call bell/phone within reach;with family/visitor present     Time: 1139-1205 PT Time Calculation (min) (ACUTE ONLY): 26 min  Charges:  $Gait Training: 8-22 mins $Therapeutic Exercise: 8-22 mins                    G Codes:      Rebeca Alert, MPT Pager: [phone]

## 2015-08-03 NOTE — Progress Notes (Signed)
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 Husband present during session and assisted as well. Pt continues to report significant pain. Pt has completed all mobility tasks (exercises, ambulation, stair negotiation) on today.   Follow Up Recommendations  Home health PT;Supervision/Assistance - 24 hour     Equipment Recommendations  Rolling walker with 5" wheels    Recommendations for Other Services       Precautions / Restrictions Precautions Precautions: Knee Required Braces or Orthoses: Knee Immobilizer - Right Knee Immobilizer - Right: Discontinue once straight leg raise with < 10 degree lag Restrictions Weight Bearing Restrictions: No RLE Weight Bearing: Weight bearing as tolerated    Mobility  Bed Mobility Overal bed mobility: Needs Assistance Bed Mobility: Supine to Sit;Sit to Supine     Supine to sit: Min assist Sit to supine: Min assist   General bed mobility comments: Assist for R LE. Husband present and assisted getting LE onto bed  Transfers Overall transfer level: Needs assistance Equipment used: Rolling walker (2 wheeled) Transfers: Sit to/from Stand Sit to Stand: Min guard Stand pivot transfers: Min guard       General transfer comment: close guard for safety. VC safety, technique, hand placement  Ambulation/Gait Ambulation/Gait assistance: Min guard Ambulation Distance (Feet): 75 Feet Assistive device: Rolling walker (2 wheeled) Gait Pattern/deviations: Step-to pattern;Antalgic     General Gait Details: close guard for safety. VCs safety, distance, sequence.    Stairs Stairs: Yes Stairs assistance: Min assist Stair Management: Backwards;With walker;Step to pattern Number of Stairs: 2 (x2) General stair comments: Min assist to stabilize  walker. VC safety, technique, sequence. Practiced x 2 for educational purposes.  Wheelchair Mobility    Modified Rankin (Stroke Patients Only)       Balance                                    Cognition Arousal/Alertness: Awake/alert Behavior During Therapy: WFL for tasks assessed/performed Overall Cognitive Status: Within Functional Limits for tasks assessed                      Exercises      General Comments        Pertinent Vitals/Pain Pain Assessment: Faces Pain Score: 5  Faces Pain Scale: Hurts even more Pain Location: R knee Pain Descriptors / Indicators: Sore;Spasm;Cramping Pain Intervention(s): Monitored during session;Repositioned;Ice applied;Heat applied (heat to thigh; ice to knee)    Home Living                      Prior Function            PT Goals (current goals can now be found in the care plan section) Progress towards PT goals: Progressing toward goals    Frequency  7X/week    PT Plan Current plan remains appropriate    Co-evaluation             End of Session Equipment Utilized During Treatment: Right knee immobilizer Activity Tolerance: Patient limited by pain Patient left: in bed;with call bell/phone within reach;with family/visitor present     Time: 2595-6387 PT Time Calculation (min) (ACUTE ONLY): 26 min  Charges:  $Gait Training: 23-37 mins $  Therapeutic Activity: 8-22 mins                    G Codes:      Weston Anna, MPT Pager: [phone]

## 2015-08-03 NOTE — Discharge Summary (Signed)
Physician Discharge Summary   Patient ID: CAPRICIA SERDA MRN: [id number] DOB/AGE: 69/10/1946 69 y.o.  Admit date: 08/01/2015 Discharge date: 08-04-2015  Primary Diagnosis:  Osteoarthritis Right knee(s)  Admission Diagnoses:  Past Medical History  Diagnosis Date  . Helicobacter pylori (H. pylori) infection     s/p treatment  . ANA positive   . Hiatal hernia   . Allergy     rhinitis  . Pain in joint     site unspecified  . Arthritis   . GERD (gastroesophageal reflux disease)   . Interstitial cystitis   . Complication of anesthesia   . PONV (postoperative nausea and vomiting)   . Heart murmur   . Psoriatic arthritis   . Fibromyalgia   . Psoriasis   . Constipation    Discharge Diagnoses:   Principal Problem:   OA (osteoarthritis) of knee  Estimated body mass index is 31.12 kg/(m^2) as calculated from the following:   Height as of this encounter: 5' 5"  (1.651 m).   Weight as of this encounter: 84.823 kg (187 lb).  Procedure:  Procedure(s) (LRB): RIGHT TOTAL KNEE ARTHROPLASTY (Right)   Consults: None  HPI: CHARISMA CHARLOT is a 69 y.o. year old female with end stage OA of her right knee with progressively worsening pain and dysfunction. She has constant pain, with activity and at rest and significant functional deficits with difficulties even with ADLs. She has had extensive non-op management including analgesics, injections of cortisone, and home exercise program, but remains in significant pain with significant dysfunction.Radiographs show bone on bone arthritis medial and patellofemoral. She presents now for right Total Knee Arthroplasty.  Laboratory Data: Admission on 08/01/2015  Component Date Value Ref Range Status  . ABO/RH(D) 08/01/2015 O POS   Final  . Antibody Screen 08/01/2015 POS   Final  . Sample Expiration 08/01/2015 08/04/2015   Final  . Unit Number 08/01/2015 [id number]   Final  . Blood Component Type 08/01/2015 RED CELLS,LR   Final  . Unit division  08/01/2015 00   Final  . Status of Unit 08/01/2015 REL FROM [facility]   Final  . Donor AG Type 08/01/2015 NEGATIVE FOR E ANTIGEN   Final  . Transfusion Status 08/01/2015 OK TO TRANSFUSE   Final  . Crossmatch Result 08/01/2015 COMPATIBLE   Final  . Unit Number 08/01/2015 [id number]   Final  . Blood Component Type 08/01/2015 RED CELLS,LR   Final  . Unit division 08/01/2015 00   Final  . Status of Unit 08/01/2015 REL FROM [facility]   Final  . Donor AG Type 08/01/2015 NEGATIVE FOR E ANTIGEN   Final  . Transfusion Status 08/01/2015 OK TO TRANSFUSE   Final  . Crossmatch Result 08/01/2015 COMPATIBLE   Final  . WBC 08/02/2015 12.4* 4.0 - 10.5 K/uL Final  . RBC 08/02/2015 3.68* 3.87 - 5.11 MIL/uL Final  . Hemoglobin 08/02/2015 11.3* 12.0 - 15.0 g/dL Final  . HCT 08/02/2015 34.0* 36.0 - 46.0 % Final  . MCV 08/02/2015 92.4  78.0 - 100.0 fL Final  . MCH 08/02/2015 30.7  26.0 - 34.0 pg Final  . MCHC 08/02/2015 33.2  30.0 - 36.0 g/dL Final  . RDW 08/02/2015 12.9  11.5 - 15.5 % Final  . Platelets 08/02/2015 208  150 - 400 K/uL Final  . Sodium 08/02/2015 131* 135 - 145 mmol/L Final  . Potassium 08/02/2015 4.3  3.5 - 5.1 mmol/L Final  . Chloride 08/02/2015 100* 101 - 111 mmol/L Final  . CO2 08/02/2015 24  22 - 32 mmol/L Final  . Glucose, Bld 08/02/2015 119* 65 - 99 mg/dL Final  . BUN 08/02/2015 13  6 - 20 mg/dL Final  . Creatinine, Ser 08/02/2015 0.70  0.44 - 1.00 mg/dL Final  . Calcium 08/02/2015 8.4* 8.9 - 10.3 mg/dL Final  . GFR calc non Af Amer 08/02/2015 >60  >60 mL/min Final  . GFR calc Af Amer 08/02/2015 >60  >60 mL/min Final   Comment: (NOTE) The eGFR has been calculated using the CKD EPI equation. This calculation has not been validated in all clinical situations. eGFR's persistently <60 mL/min signify possible Chronic Kidney Disease.   . Anion gap 08/02/2015 7  5 - 15 Final  . WBC 08/03/2015 14.0* 4.0 - 10.5 K/uL Final  . RBC 08/03/2015 3.53* 3.87 - 5.11 MIL/uL Final  . Hemoglobin  08/03/2015 10.7* 12.0 - 15.0 g/dL Final  . HCT 08/03/2015 32.3* 36.0 - 46.0 % Final  . MCV 08/03/2015 91.5  78.0 - 100.0 fL Final  . MCH 08/03/2015 30.3  26.0 - 34.0 pg Final  . MCHC 08/03/2015 33.1  30.0 - 36.0 g/dL Final  . RDW 08/03/2015 13.1  11.5 - 15.5 % Final  . Platelets 08/03/2015 210  150 - 400 K/uL Final  . Sodium 08/03/2015 136  135 - 145 mmol/L Final  . Potassium 08/03/2015 3.9  3.5 - 5.1 mmol/L Final  . Chloride 08/03/2015 102  101 - 111 mmol/L Final  . CO2 08/03/2015 29  22 - 32 mmol/L Final  . Glucose, Bld 08/03/2015 113* 65 - 99 mg/dL Final  . BUN 08/03/2015 13  6 - 20 mg/dL Final  . Creatinine, Ser 08/03/2015 0.70  0.44 - 1.00 mg/dL Final  . Calcium 08/03/2015 8.4* 8.9 - 10.3 mg/dL Final  . GFR calc non Af Amer 08/03/2015 >60  >60 mL/min Final  . GFR calc Af Amer 08/03/2015 >60  >60 mL/min Final   Comment: (NOTE) The eGFR has been calculated using the CKD EPI equation. This calculation has not been validated in all clinical situations. eGFR's persistently <60 mL/min signify possible Chronic Kidney Disease.   . Anion gap 08/03/2015 5  5 - 15 Final  . WBC 08/04/2015 9.7  4.0 - 10.5 K/uL Final  . RBC 08/04/2015 3.53* 3.87 - 5.11 MIL/uL Final  . Hemoglobin 08/04/2015 10.8* 12.0 - 15.0 g/dL Final  . HCT 08/04/2015 32.8* 36.0 - 46.0 % Final  . MCV 08/04/2015 92.9  78.0 - 100.0 fL Final  . MCH 08/04/2015 30.6  26.0 - 34.0 pg Final  . MCHC 08/04/2015 32.9  30.0 - 36.0 g/dL Final  . RDW 08/04/2015 13.5  11.5 - 15.5 % Final  . Platelets 08/04/2015 200  150 - 400 K/uL Final  Hospital Outpatient Visit on 07/19/2015  Component Date Value Ref Range Status  . aPTT 07/19/2015 28  24 - 37 seconds Final  . WBC 07/19/2015 8.5  4.0 - 10.5 K/uL Final  . RBC 07/19/2015 4.26  3.87 - 5.11 MIL/uL Final  . Hemoglobin 07/19/2015 13.6  12.0 - 15.0 g/dL Final  . HCT 07/19/2015 39.4  36.0 - 46.0 % Final  . MCV 07/19/2015 92.5  78.0 - 100.0 fL Final  . MCH 07/19/2015 31.9  26.0 - 34.0 pg  Final  . MCHC 07/19/2015 34.5  30.0 - 36.0 g/dL Final  . RDW 07/19/2015 12.9  11.5 - 15.5 % Final  . Platelets 07/19/2015 209  150 - 400 K/uL Final  . Sodium 07/19/2015 140  135 -  145 mmol/L Final  . Potassium 07/19/2015 3.7  3.5 - 5.1 mmol/L Final  . Chloride 07/19/2015 107  101 - 111 mmol/L Final  . CO2 07/19/2015 26  22 - 32 mmol/L Final  . Glucose, Bld 07/19/2015 115* 65 - 99 mg/dL Final  . BUN 07/19/2015 17  6 - 20 mg/dL Final  . Creatinine, Ser 07/19/2015 0.94  0.44 - 1.00 mg/dL Final  . Calcium 07/19/2015 9.1  8.9 - 10.3 mg/dL Final  . Total Protein 07/19/2015 7.0  6.5 - 8.1 g/dL Final  . Albumin 07/19/2015 3.9  3.5 - 5.0 g/dL Final  . AST 07/19/2015 24  15 - 41 U/L Final  . ALT 07/19/2015 24  14 - 54 U/L Final  . Alkaline Phosphatase 07/19/2015 78  38 - 126 U/L Final  . Total Bilirubin 07/19/2015 0.5  0.3 - 1.2 mg/dL Final  . GFR calc non Af Amer 07/19/2015 >60  >60 mL/min Final  . GFR calc Af Amer 07/19/2015 >60  >60 mL/min Final   Comment: (NOTE) The eGFR has been calculated using the CKD EPI equation. This calculation has not been validated in all clinical situations. eGFR's persistently <60 mL/min signify possible Chronic Kidney Disease.   . Anion gap 07/19/2015 7  5 - 15 Final  . Prothrombin Time 07/19/2015 13.2  11.6 - 15.2 seconds Final  . INR 07/19/2015 0.98  0.00 - 1.49 Final  . ABO/RH(D) 07/19/2015 O POS   Final  . Antibody Screen 07/19/2015 POS   Final  . Sample Expiration 07/19/2015 07/31/2015   Final  . Antibody Identification [id number] ANTI E   Final  . DAT, IgG 07/19/2015 NEG   Final  . PT AG Type 07/19/2015 NEGATIVE FOR E ANTIGEN   Final  . Color, Urine 07/19/2015 YELLOW  YELLOW Final  . APPearance 07/19/2015 CLEAR  CLEAR Final  . Specific Gravity, Urine 07/19/2015 1.007  1.005 - 1.030 Final  . pH 07/19/2015 5.0  5.0 - 8.0 Final  . Glucose, UA 07/19/2015 NEGATIVE  NEGATIVE mg/dL Final  . Hgb urine dipstick 07/19/2015 NEGATIVE  NEGATIVE Final  .  Bilirubin Urine 07/19/2015 NEGATIVE  NEGATIVE Final  . Ketones, ur 07/19/2015 NEGATIVE  NEGATIVE mg/dL Final  . Protein, ur 07/19/2015 NEGATIVE  NEGATIVE mg/dL Final  . Urobilinogen, UA 07/19/2015 0.2  0.0 - 1.0 mg/dL Final  . Nitrite 07/19/2015 NEGATIVE  NEGATIVE Final  . Leukocytes, UA 07/19/2015 NEGATIVE  NEGATIVE Final   MICROSCOPIC NOT DONE ON URINES WITH NEGATIVE PROTEIN, BLOOD, LEUKOCYTES, NITRITE, OR GLUCOSE <1000 mg/dL.  Marland Kitchen MRSA, PCR 07/19/2015 NEGATIVE  NEGATIVE Final  . Staphylococcus aureus 07/19/2015 NEGATIVE  NEGATIVE Final   Comment:        The Xpert SA Assay (FDA approved for NASAL specimens in patients over 20 years of age), is one component of a comprehensive surveillance program.  Test performance has been validated by [facility] for patients greater than or equal to 22 year old. It is not intended to diagnose infection nor to guide or monitor treatment.      X-Rays:No results found.  EKG: Orders placed or performed in visit on 01/31/10  . Converted CEM[facility] EKG     Hospital Course: TEREZ MONTEE is a 69 y.o. who was admitted to [facility]. They were brought to the operating room on 08/01/2015 and underwent Procedure(s): RIGHT TOTAL KNEE ARTHROPLASTY.  Patient tolerated the procedure well and was later transferred to the recovery room and then to the orthopaedic floor for  postoperative care.  They were given PO and IV analgesics for pain control following their surgery.  They were given 24 hours of postoperative antibiotics of  Anti-infectives    Start     Dose/Rate Route Frequency Ordered Stop   08/01/15 2000  vancomycin (VANCOCIN) IVPB 1000 mg/200 mL premix     1,000 mg 200 mL/hr over 60 Minutes Intravenous Every 12 hours 08/01/15 1031 08/01/15 2144   08/01/15 0507  vancomycin (VANCOCIN) IVPB 1000 mg/200 mL premix     1,000 mg 200 mL/hr over 60 Minutes Intravenous On call to O.R. 08/01/15 0507 08/01/15 0732     and started on DVT prophylaxis in  the form of Xarelto.   PT and OT were ordered for total joint protocol.  Discharge planning consulted to help with postop disposition and equipment needs.  Patient had a tough night on the evening of surgery due to pain and nausea.  They attempted OOB on the day of surgery but experienced some dizziness. They started to get up OOB with therapy on day one. Hemovac drain was pulled without difficulty.  Continued to work with therapy into day two.  Dressing was changed on day two and the incision was healing well.  They were starting to show progress with therapy but had not met all of her goals.  By day three, the patient had progressed with therapy and meeting their goals.  Incision was healing well.  Patient was seen in rounds and was ready to go home.  Discharge home with home health Diet - Cardiac diet Follow up - in 2 weeks Activity - WBAT Disposition - Home Condition Upon Discharge - Good D/C Meds - See DC Summary DVT Prophylaxis - Xarelto      Discharge Instructions    Call MD / Call 911    Complete by:  As directed   If you experience chest pain or shortness of breath, CALL 911 and be transported to the hospital emergency room.  If you develope a fever above 101 F, pus (white drainage) or increased drainage or redness at the wound, or calf pain, call your surgeon's office.     Change dressing    Complete by:  As directed   Change dressing daily with sterile 4 x 4 inch gauze dressing and apply TED hose. Do not submerge the incision under water.     Constipation Prevention    Complete by:  As directed   Drink plenty of fluids.  Prune juice may be helpful.  You may use a stool softener, such as Colace (over the counter) 100 mg twice a day.  Use MiraLax (over the counter) for constipation as needed.     Diet general    Complete by:  As directed      Discharge instructions    Complete by:  As directed   Pick up stool softner and laxative for home use following surgery while on pain  medications. Do not submerge incision under water. Please use good hand washing techniques while changing dressing each day. May shower starting three days after surgery. Please use a clean towel to pat the incision dry following showers. Continue to use ice for pain and swelling after surgery. Do not use any lotions or creams on the incision until instructed by your surgeon.  Take Xarelto for two and a half more weeks, then discontinue Xarelto. Once the patient has completed the blood thinner regimen, then take a Baby 81 mg Aspirin daily for three more weeks.  Postoperative Constipation Protocol  Constipation - defined medically as fewer than three stools per week and severe constipation as less than one stool per week.  One of the most common issues patients have following surgery is constipation.  Even if you have a regular bowel pattern at home, your normal regimen is likely to be disrupted due to multiple reasons following surgery.  Combination of anesthesia, postoperative narcotics, change in appetite and fluid intake all can affect your bowels.  In order to avoid complications following surgery, here are some recommendations in order to help you during your recovery period.  Colace (docusate) - Pick up an over-the-counter form of Colace or another stool softener and take twice a day as long as you are requiring postoperative pain medications.  Take with a full glass of water daily.  If you experience loose stools or diarrhea, hold the colace until you stool forms back up.  If your symptoms do not get better within 1 week or if they get worse, check with your doctor.  Dulcolax (bisacodyl) - Pick up over-the-counter and take as directed by the product packaging as needed to assist with the movement of your bowels.  Take with a full glass of water.  Use this product as needed if not relieved by Colace only.   MiraLax (polyethylene glycol) - Pick up over-the-counter to have on hand.  MiraLax  is a solution that will increase the amount of water in your bowels to assist with bowel movements.  Take as directed and can mix with a glass of water, juice, soda, coffee, or tea.  Take if you go more than two days without a movement. Do not use MiraLax more than once per day. Call your doctor if you are still constipated or irregular after using this medication for 7 days in a row.  If you continue to have problems with postoperative constipation, please contact the office for further assistance and recommendations.  If you experience "the worst abdominal pain ever" or develop nausea or vomiting, please contact the office immediatly for further recommendations for treatment.     Do not put a pillow under the knee. Place it under the heel.    Complete by:  As directed      Do not sit on low chairs, stoools or toilet seats, as it may be difficult to get up from low surfaces    Complete by:  As directed      Driving restrictions    Complete by:  As directed   No driving until released by the physician.     Increase activity slowly as tolerated    Complete by:  As directed      Lifting restrictions    Complete by:  As directed   No lifting until released by the physician.     Patient may shower    Complete by:  As directed   You may shower without a dressing once there is no drainage.  Do not wash over the wound.  If drainage remains, do not shower until drainage stops.     TED hose    Complete by:  As directed   Use stockings (TED hose) for 3 weeks on both leg(s).  You may remove them at night for sleeping.     Weight bearing as tolerated    Complete by:  As directed   Laterality:  right  Extremity:  Lower            Medication List  STOP taking these medications        CALCIUM 1200 PO     Fish Oil 1000 MG Caps     FLAX SEED OIL PO     glucosamine-chondroitin 500-400 MG tablet     ibuprofen 200 MG tablet  Commonly known as:  ADVIL,MOTRIN     naproxen sodium 220 MG  tablet  Commonly known as:  ANAPROX     TURMERIC PO     Vitamin D3 1000 UNITS Caps      TAKE these medications        cetirizine 10 MG tablet  Commonly known as:  ZYRTEC  Take 10 mg by mouth daily.     cyclobenzaprine 10 MG tablet  Commonly known as:  FLEXERIL  Take 1 tablet (10 mg total) by mouth 3 (three) times daily as needed for muscle spasms.     diphenhydrAMINE 25 MG tablet  Commonly known as:  BENADRYL  Take 25 mg by mouth every 8 (eight) hours as needed for allergies.     EPIPEN 2-PAK 0.3 mg/0.3 mL Soaj injection  Generic drug:  EPINEPHrine  use as directed for INJECTION     esomeprazole 20 MG capsule  Commonly known as:  NEXIUM  Take 20 mg by mouth daily at 12 noon.     HYDROmorphone 2 MG tablet  Commonly known as:  DILAUDID  Take 1-2 tablets (2-4 mg total) by mouth every 4 (four) hours as needed for moderate pain or severe pain.     Loratadine 10 MG Caps  Take 1 capsule by mouth as needed (bad allergy day). seasonal     ondansetron 4 MG tablet  Commonly known as:  ZOFRAN  Take 1 tablet (4 mg total) by mouth every 6 (six) hours as needed for nausea.     RESTASIS 0.05 % ophthalmic emulsion  Generic drug:  cycloSPORINE  Place 1 drop into both eyes 2 (two) times daily.     rivaroxaban 10 MG Tabs tablet  Commonly known as:  XARELTO  Take 1 tablet (10 mg total) by mouth daily with breakfast. Take Xarelto for two and a half more weeks, then discontinue Xarelto. Once the patient has completed the blood thinner regimen, then take a Baby 81 mg Aspirin daily for three more weeks.     traMADol 50 MG tablet  Commonly known as:  ULTRAM  Take 1-2 tablets (50-100 mg total) by mouth every 6 (six) hours as needed (mild pain).     traZODone 50 MG tablet  Commonly known as:  DESYREL  Take 50 mg by mouth at bedtime as needed for sleep.       Follow-up Information    Follow up with [facility].   Why:  home health physical and occupational therapy and aide    Contact information:   [address] [place] [address] [phone]       Follow up with [facility].   Why:  rolling walker and 3n1 (bedside commode)   Contact information:   [address] [place]  [address] [phone]       Follow up with Gearlean Alf, MD. Schedule an appointment as soon as possible for a visit on 08/16/2015.   Specialty:  Orthopedic Surgery   Why:  Call office at [phone] to setup appointment on Tuesday 08/16/2015 with Dr. Wynelle Link.   Contact information:   [address] [place] [address] [phone]  Signed: Arlee Muslim, PA-C Orthopaedic Surgery 08/04/2015, 8:02 AM

## 2015-08-04 LAB — CBC
HEMATOCRIT: 32.8 % — AB (ref 36.0–46.0)
Hemoglobin: 10.8 g/dL — ABNORMAL LOW (ref 12.0–15.0)
MCH: 30.6 pg (ref 26.0–34.0)
MCHC: 32.9 g/dL (ref 30.0–36.0)
MCV: 92.9 fL (ref 78.0–100.0)
Platelets: 200 10*3/uL (ref 150–400)
RBC: 3.53 MIL/uL — ABNORMAL LOW (ref 3.87–5.11)
RDW: 13.5 % (ref 11.5–15.5)
WBC: 9.7 10*3/uL (ref 4.0–10.5)

## 2015-08-04 MED ORDER — CYCLOBENZAPRINE HCL 10 MG PO TABS: 10.0000 mg | ORAL_TABLET | Freq: Three times a day (TID) | ORAL | Status: DC | PRN

## 2015-08-04 MED ORDER — HYDROMORPHONE HCL 2 MG PO TABS: 2.0000 mg | ORAL_TABLET | ORAL | Status: DC | PRN

## 2015-08-04 MED ORDER — RIVAROXABAN 10 MG PO TABS: 10.0000 mg | ORAL_TABLET | Freq: Every day | ORAL | Status: DC

## 2015-08-04 MED ORDER — TRAMADOL HCL 50 MG PO TABS: 50.0000 mg | ORAL_TABLET | Freq: Four times a day (QID) | ORAL | Status: DC | PRN

## 2015-08-04 MED ORDER — ONDANSETRON HCL 4 MG PO TABS: 4.0000 mg | ORAL_TABLET | Freq: Four times a day (QID) | ORAL | Status: DC | PRN

## 2015-08-04 NOTE — Progress Notes (Signed)
Physical Therapy Treatment Patient Details Name: Susan Carpenter MRN: [id number] DOB: 1946/06/04 Today's Date: 08/04/2015    History of Present Illness 69 yo female s/p R TKA 08/01/15    PT Comments    Pt reports feeling better today. Significant pain with exercises but pt was able to complete all mobility tasks again on today. Practiced gait training, exercises, and stair training. Ready to d/c from PT standpoint.   Follow Up Recommendations  Home health PT;Supervision/Assistance - 24 hour     Equipment Recommendations  Rolling walker with 5" wheels    Recommendations for Other Services       Precautions / Restrictions Precautions Precautions: Knee Required Braces or Orthoses: Knee Immobilizer - Left Knee Immobilizer - Right: Discontinue once straight leg raise with < 10 degree lag Restrictions Weight Bearing Restrictions: No RLE Weight Bearing: Weight bearing as tolerated    Mobility  Bed Mobility Overal bed mobility: Needs Assistance Bed Mobility: Supine to Sit;Sit to Supine     Supine to sit: Min assist Sit to supine: Min assist   General bed mobility comments: Assist for R LE.   Transfers Overall transfer level: Needs assistance Equipment used: Rolling walker (2 wheeled) Transfers: Sit to/from Stand Sit to Stand: Min guard         General transfer comment: close guard for safety. VC safety, technique, hand placement  Ambulation/Gait Ambulation/Gait assistance: Min guard Ambulation Distance (Feet): 75 Feet Assistive device: Rolling walker (2 wheeled) Gait Pattern/deviations: Step-to pattern;Antalgic     General Gait Details: close guard for safety. VCs safety, distance, sequence.    Stairs Stairs: Yes Stairs assistance: Min assist Stair Management: Backwards;With walker;Step to pattern   General stair comments: Min assist to stabilize walker. VC safety, technique, sequence.   Wheelchair Mobility    Modified Rankin (Stroke Patients Only)        Balance                                    Cognition Arousal/Alertness: Awake/alert Behavior During Therapy: WFL for tasks assessed/performed Overall Cognitive Status: Within Functional Limits for tasks assessed                      Exercises Total Joint Exercises Ankle Circles/Pumps: AROM;Both;10 reps;Supine Quad Sets: AROM;Both;10 reps;Supine Heel Slides: AAROM;Right;10 reps;Supine Hip ABduction/ADduction: AAROM;Right;10 reps;Supine Straight Leg Raises: AAROM;Right;10 reps;Supine Goniometric ROM: ~10-45 degrees (limited by pain)    General Comments        Pertinent Vitals/Pain Pain Assessment: 0-10 Pain Score: 7  Pain Location: R knee with activity Pain Descriptors / Indicators: Aching;Sore Pain Intervention(s): Monitored during session;Ice applied;Repositioned    Home Living                      Prior Function            PT Goals (current goals can now be found in the care plan section) Progress towards PT goals: Progressing toward goals    Frequency  7X/week    PT Plan Current plan remains appropriate    Co-evaluation             End of Session Equipment Utilized During Treatment: Right knee immobilizer;Gait belt Activity Tolerance: Patient tolerated treatment well Patient left: in bed;with call bell/phone within reach;with family/visitor present     Time: 1000-1043 PT Time Calculation (min) (ACUTE ONLY): 43 min  Charges:  $  Gait Training: 23-37 mins $Therapeutic Exercise: 38-52 mins                    G Codes:      Rebeca Alert, MPT Pager: [phone]

## 2015-08-04 NOTE — Care Management Important Message (Signed)
Important Message  Susan Carpenter  Name: Susan Carpenter MRN: [id number] Date of Birth: 1946-07-28   Medicare Important Message Given:  Yes-second notification given    Renie Ora 08/04/2015, 3:51 PMImportant Message  Patient Details  Name: Susan Carpenter MRN: [id number] Date of Birth: 09/27/1946   Medicare Important Message Given:  Yes-second notification given    Renie Ora 08/04/2015, 3:50 PM

## 2015-08-04 NOTE — Progress Notes (Signed)
   Subjective: 3 Days Post-Op Procedure(s) (LRB): RIGHT TOTAL KNEE ARTHROPLASTY (Right) Patient reports pain as mild.   Patient seen in rounds by Dr. Lequita Halt.  She is doing MUCH better today. Patient is well, but has had some minor complaints of pain in the knee, requiring pain medications Patient is ready to go home today.  Objective: Vital signs in last 24 hours: Temp:  [97.7 F (36.5 C)-98.8 F (37.1 C)] 98.8 F (37.1 C) (09/29 0445) Pulse Rate:  [71-95] 95 (09/29 0445) Resp:  [15-16] 16 (09/29 0445) BP: (122-138)/(58-68) 125/68 mmHg (09/29 0445) SpO2:  [93 %-95 %] 95 % (09/29 0445)  Intake/Output from previous day:  Intake/Output Summary (Last 24 hours) at 08/04/15 0754 Last data filed at 08/04/15 0445  Gross per 24 hour  Intake   1580 ml  Output      0 ml  Net   1580 ml    Labs:  Recent Labs  08/02/15 0508 08/03/15 0535 08/04/15 0455  HGB 11.3* 10.7* 10.8*    Recent Labs  08/03/15 0535 08/04/15 0455  WBC 14.0* 9.7  RBC 3.53* 3.53*  HCT 32.3* 32.8*  PLT 210 200    Recent Labs  08/02/15 0508 08/03/15 0535  NA 131* 136  K 4.3 3.9  CL 100* 102  CO2 24 29  BUN 13 13  CREATININE 0.70 0.70  GLUCOSE 119* 113*  CALCIUM 8.4* 8.4*   No results for input(s): LABPT, INR in the last 72 hours.  EXAM: General - Patient is Alert, Appropriate and Oriented Extremity - Neurovascular intact Sensation intact distally Dorsiflexion/Plantar flexion intact Incision - clean, dry, no drainage Motor Function - intact, moving foot and toes well on exam.   Assessment/Plan: 3 Days Post-Op Procedure(s) (LRB): RIGHT TOTAL KNEE ARTHROPLASTY (Right) Procedure(s) (LRB): RIGHT TOTAL KNEE ARTHROPLASTY (Right) Past Medical History  Diagnosis Date  . Helicobacter pylori (H. pylori) infection     s/p treatment  . ANA positive   . Hiatal hernia   . Allergy     rhinitis  . Pain in joint     site unspecified  . Arthritis   . GERD (gastroesophageal reflux disease)   .  Interstitial cystitis   . Complication of anesthesia   . PONV (postoperative nausea and vomiting)   . Heart murmur   . Psoriatic arthritis   . Fibromyalgia   . Psoriasis   . Constipation    Principal Problem:   OA (osteoarthritis) of knee  Estimated body mass index is 31.12 kg/(m^2) as calculated from the following:   Height as of this encounter:  (1.651 m).   Weight as of this encounter: 84.823 kg (187 lb). Up with therapy Discharge home with home health Diet - Cardiac diet Follow up - in 2 weeks Activity - WBAT Disposition - Home Condition Upon Discharge - Good D/C Meds - See DC Summary DVT Prophylaxis - Xarelto  Avel Peace, PA-C Orthopaedic Surgery 08/04/2015, 7:54 AM

## 2016-01-16 HISTORY — PX: OTHER SURGICAL HISTORY: SHX169

## 2016-09-11 ENCOUNTER — Ambulatory Visit: Payer: Self-pay | Admitting: Rheumatology

## 2016-09-19 DIAGNOSIS — M19041 Primary osteoarthritis, right hand: Secondary | ICD-10-CM | POA: Insufficient documentation

## 2016-09-19 DIAGNOSIS — M19042 Primary osteoarthritis, left hand: Secondary | ICD-10-CM

## 2016-09-19 NOTE — Progress Notes (Deleted)
Office Visit Note  Patient: Susan Carpenter             Date of Birth: 11/03/1946           MRN: [id number]             PCP: Gaye AlkenBARNES,ELIZABETH STEWART, MD Referring: Juluis RainierBarnes, Elizabeth, MD Visit Date: 09/25/2016 Occupation: @GUAROCC @    Subjective:  No chief complaint on file.   History of Present Illness: Susan BellmanDiane T Carpenter is a 70 y.o. female ***   Activities of Daily Living:  Patient reports morning stiffness for *** {minute/hour:19697}.   Patient {ACTIONS;DENIES/REPORTS:21021675::"Denies"} nocturnal pain.  Difficulty dressing/grooming: {ACTIONS;DENIES/REPORTS:21021675::"Denies"} Difficulty climbing stairs: {ACTIONS;DENIES/REPORTS:21021675::"Denies"} Difficulty getting out of chair: {ACTIONS;DENIES/REPORTS:21021675::"Denies"} Difficulty using hands for taps, buttons, cutlery, and/or writing: {ACTIONS;DENIES/REPORTS:21021675::"Denies"}   No Rheumatology ROS completed.   PMFS History:  Patient Active Problem List   Diagnosis Date Noted  . Primary osteoarthritis of both hands 09/19/2016  . OA (osteoarthritis) of knee 08/01/2015  . INTERSTITIAL CYSTITIS 01/31/2010  . Fibromyalgia 01/31/2010  . FATIGUE 01/31/2010  . DYSPNEA/SHORTNESS OF BREATH 01/31/2010  . FREQUENCY, URINARY 01/31/2010  . Depression 01/24/2009  . Migraines 09/04/2008  . HELICOBACTER PYLORI INFECTION 10/14/2007  . ALLERGIC RHINITIS CAUSE UNSPECIFIED 10/14/2007  . HIATAL HERNIA 10/14/2007  . ANA POSITIVE 10/14/2007  . Hypothyroidism 10/10/2007  . PAIN IN JOINT, SITE UNSPECIFIED 10/10/2007  . WEIGHT GAIN 10/10/2007    Past Medical History:  Diagnosis Date  . Allergy    rhinitis  . ANA positive   . Arthritis   . Complication of anesthesia   . Constipation   . Fibromyalgia   . GERD (gastroesophageal reflux disease)   . Heart murmur   . Helicobacter pylori (H. pylori) infection    s/p treatment  . Hiatal hernia   . Interstitial cystitis   . Pain in joint    site unspecified  . PONV  (postoperative nausea and vomiting)   . Psoriasis   . Psoriatic arthritis     Family History  Problem Relation Age of Onset  . Cancer Mother     COLON   . Mental illness Mother     ALZHEIMERS; DEPRESSION  . Thyroid disease Mother     HYPOTHYROID  . Diabetes Father   . Heart disease Father     MI  . Alcohol abuse Father   . Alcohol abuse Maternal Uncle   . Diabetes Maternal Uncle   . Hypertension Maternal Grandmother   . Stroke Maternal Grandmother    Past Surgical History:  Procedure Laterality Date  . BLADDER SURGERY     x2 for scar tissue  . CHOLECYSTECTOMY  1995  . cysts on ovaries    . polyp removal  1984   from fallopian tube  . TOTAL KNEE ARTHROPLASTY Right 08/01/2015   Procedure: RIGHT TOTAL KNEE ARTHROPLASTY;  Surgeon: Ollen GrossFrank Aluisio, MD;  Location: [facility];  Service: Orthopedics;  Laterality: Right;  . TUBAL LIGATION     Social History   Social History Narrative  . No narrative on file     Objective: Vital Signs: There were no vitals taken for this visit.   Physical Exam   Musculoskeletal Exam: ***  CDAI Exam: No CDAI exam completed.    Investigation: Findings:  August 2014 EB: Negative, hepatitis panel negative, 04/24/2016 CMP normal, CBC normal    Imaging: No results found.  Speciality Comments: No specialty comments available.    Procedures:  No procedures performed Allergies: Cephalexin; Penicillins; and Erythromycin   Assessment /  Plan: Visit Diagnoses: Fibromyalgia  Psoriasis  Primary osteoarthritis of both knees - Right total knee replacement September 2016 Dr. Despina HickAlusio  Primary osteoarthritis of both hands  Migraine without status migrainosus, not intractable, unspecified migraine type  Depression, unspecified depression type  Hypothyroidism, unspecified type  INTERSTITIAL CYSTITIS    Orders: No orders of the defined types were placed in this encounter.  No orders of the defined types were placed in this  encounter.   Face-to-face time spent with patient was *** minutes. 50% of time was spent in counseling and coordination of care.  Follow-Up Instructions: No Follow-up on file.   Pollyann SavoyShaili Aima Mcwhirt, MD

## 2016-09-21 ENCOUNTER — Encounter: Payer: Self-pay | Admitting: Rheumatology

## 2016-09-21 ENCOUNTER — Ambulatory Visit (INDEPENDENT_AMBULATORY_CARE_PROVIDER_SITE_OTHER): Payer: Medicare Other | Admitting: Rheumatology

## 2016-09-21 VITALS — BP 155/82 | HR 82 | Resp 12 | Ht 64.0 in | Wt 201.0 lb

## 2016-09-21 DIAGNOSIS — M62838 Other muscle spasm: Secondary | ICD-10-CM | POA: Diagnosis not present

## 2016-09-21 DIAGNOSIS — L669 Cicatricial alopecia, unspecified: Secondary | ICD-10-CM

## 2016-09-21 DIAGNOSIS — M797 Fibromyalgia: Secondary | ICD-10-CM | POA: Diagnosis not present

## 2016-09-21 DIAGNOSIS — G4700 Insomnia, unspecified: Secondary | ICD-10-CM | POA: Diagnosis not present

## 2016-09-21 DIAGNOSIS — R5383 Other fatigue: Secondary | ICD-10-CM | POA: Diagnosis not present

## 2016-09-21 MED ORDER — BACLOFEN 10 MG PO TABS: 10.0000 mg | ORAL_TABLET | Freq: Two times a day (BID) | ORAL | 2 refills | Status: DC

## 2016-09-21 MED ORDER — TIZANIDINE HCL 4 MG PO TABS: 4.0000 mg | ORAL_TABLET | Freq: Every day | ORAL | 2 refills | Status: DC

## 2016-09-21 MED ORDER — LIDOCAINE HCL 1 % IJ SOLN
0.5000 mL | INTRAMUSCULAR | Status: AC | PRN
  Administered 2016-09-21: .5 mL

## 2016-09-21 MED ORDER — TRIAMCINOLONE ACETONIDE 40 MG/ML IJ SUSP
10.0000 mg | INTRAMUSCULAR | Status: AC | PRN
  Administered 2016-09-21: 10 mg via INTRAMUSCULAR

## 2016-09-21 MED ORDER — PREDNISONE 5 MG PO TABS: ORAL_TABLET | ORAL | 0 refills | Status: DC

## 2016-09-21 NOTE — Progress Notes (Signed)
Office Visit Note  Patient: Susan Carpenter             Date of Birth: Jan 24, 1946           MRN: [id number]             PCP: Gaye Alken, MD Referring: Juluis Rainier, MD Visit Date: 09/21/2016 Occupation: @GUAROCC @    Subjective:  No chief complaint on file. Follow-up on fibromyalgia, psoriasis, neck pain  History of Present Illness: Susan Carpenter is a 70 y.o. female  Last seen in our office on 03/19/2016. Patient's fibromyalgia is rated between 8 and 9 on a scale of 0-10 with fatigue also rated about 8-9.  She's been struggling with left greater trochanter bursa pain since 07/04/2016. At that visit she was seen by Arlys John gave her cortisone injection. Patient states that she had no relief.  For the last 1 month she's been having "crick in the neck". She states it is been going on for about 4 weeks. She has seen a chiropractor but only received 25% improvement. They did an x-ray at his office and she states that it was scar tissue on the x-ray; a lot of arthritis. With TENS unit and adjustments she only received partial improvement of less than 10% over. Of 6 visits.  She continued to take her medications of Robaxin and Flexeril but has had absolutely no relief. She questions whether her visits to the chiropractor are still advised. Patient states that she's had minimal relief and does not know what to do.   Activities of Daily Living:  Patient reports morning stiffness for 15 minutes.   Patient Reports nocturnal pain.  Difficulty dressing/grooming: Reports Difficulty climbing stairs: Reports Difficulty getting out of chair: Reports Difficulty using hands for taps, buttons, cutlery, and/or writing: Reports   Review of Systems  Constitutional: Positive for fatigue.  HENT: Negative for mouth sores and mouth dryness.   Eyes: Negative for dryness.  Respiratory: Negative for shortness of breath.   Gastrointestinal: Negative for constipation and diarrhea.    Musculoskeletal: Positive for myalgias and myalgias.  Skin: Negative for sensitivity to sunlight.  Psychiatric/Behavioral: Positive for sleep disturbance. Negative for decreased concentration.    PMFS History:  Patient Active Problem List   Diagnosis Date Noted  . 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 Primary osteoarthritis of both hands 09/19/2016  . OA (osteoarthritis) of knee 08/01/2015  . INTERSTITIAL CYSTITIS 01/31/2010  . Fibromyalgia 01/31/2010  . FATIGUE 01/31/2010  . DYSPNEA/SHORTNESS OF BREATH 01/31/2010  . FREQUENCY, URINARY 01/31/2010  . Depression 01/24/2009  . Migraines 09/04/2008  . HELICOBACTER PYLORI INFECTION 10/14/2007  . ALLERGIC RHINITIS CAUSE UNSPECIFIED 10/14/2007  . HIATAL HERNIA 10/14/2007  . ANA POSITIVE 10/14/2007  . Hypothyroidism 10/10/2007  . PAIN IN JOINT, SITE UNSPECIFIED 10/10/2007  . WEIGHT GAIN 10/10/2007    Past Medical History:  Diagnosis Date  . Allergy    rhinitis  . ANA positive   . Arthritis   . Complication of anesthesia   . Constipation   . Fibromyalgia   . GERD (gastroesophageal reflux disease)   . Heart murmur   . Helicobacter pylori (H. pylori) infection    s/p treatment  . Hiatal hernia   . Interstitial cystitis   . Pain in joint    site unspecified  . PONV (postoperative nausea and vomiting)   . Psoriasis   . Psoriatic arthritis (HCC)     Family History  Problem Relation Age of Onset  . Diabetes Father   .  Heart disease Father     MI  . Alcohol abuse Father   . Cancer Mother     COLON   . Mental illness Mother     ALZHEIMERS; DEPRESSION  . Thyroid disease Mother     HYPOTHYROID  . Alcohol abuse Maternal Uncle   . Diabetes Maternal Uncle   . Hypertension Maternal Grandmother   . Stroke Maternal Grandmother    Past Surgical History:  Procedure Laterality Date  . BLADDER SURGERY     x2 for scar tissue  . CHOLECYSTECTOMY  1995  . cysts on ovaries    . polyp removal  1984   from fallopian tube  . THUMB  SURGERY Right 01/16/2016  . TOTAL KNEE ARTHROPLASTY Right 08/01/2015   Procedure: RIGHT TOTAL KNEE ARTHROPLASTY;  Surgeon: Ollen GrossFrank Aluisio, MD;  Location: [facility];  Service: Orthopedics;  Laterality: Right;  . TUBAL LIGATION     Social History   Social History Narrative  . No narrative on file     Objective: Vital Signs: BP (!) 155/82 (BP Location: Left Arm, Patient Position: Sitting, Cuff Size: Large)   Pulse 82   Resp 12   Ht 5\' 4"  (1.626 m)   Wt 201 lb (91.2 kg)   BMI 34.50 kg/m    Physical Exam  Constitutional: She is oriented to person, place, and time. She appears well-developed and well-nourished.  HENT:  Head: Normocephalic and atraumatic.  Eyes: EOM are normal. Pupils are equal, round, and reactive to light.  Cardiovascular: Normal rate, regular rhythm and normal heart sounds.  Exam reveals no gallop and no friction rub.   No murmur heard. Pulmonary/Chest: Effort normal and breath sounds normal. She has no wheezes. She has no rales.  Abdominal: Soft. Bowel sounds are normal. She exhibits no distension. There is no tenderness. There is no guarding. No hernia.  Musculoskeletal: Normal range of motion. She exhibits no edema, tenderness or deformity.  Lymphadenopathy:    She has no cervical adenopathy.  Neurological: She is alert and oriented to person, place, and time. Coordination normal.  Skin: Skin is warm and dry. Capillary refill takes less than 2 seconds. No rash noted.  Psychiatric: She has a normal mood and affect. Her behavior is normal.  Nursing note and vitals reviewed.    Musculoskeletal Exam:  Full range of motion of all joints; except neck pain. Slightly painful to move to the right. Grip strength is equal and strong bilaterally Fibromyalgia tender points are 18 out of 18 positive  Right knee has totally replacement. Occasionally gets warm according to the patient. But has good range of motion.   CDAI Exam: CDAI Homunculus Exam:   Joint Counts:  CDAI  Tender Joint count: 0 CDAI Swollen Joint count: 0   No synovitis on examination.  Investigation: No additional findings.   Imaging: No results found.  Speciality Comments: No specialty comments available.    Procedures:  Trigger Point Inj Date/Time: 09/21/2016 1:16 PM Performed by: Tawni PummelPANWALA, Kiante Ciavarella Authorized by: Tawni PummelPANWALA, Keyry Iracheta   Consent Given by:  Patient Site marked: the procedure site was marked   Timeout: prior to procedure the correct patient, procedure, and site was verified   Indications:  Muscle spasm, pain and therapeutic Total # of Trigger Points:  2 Location: neck   Needle Size:  27 G Approach:  Dorsal Medications #1:  0.5 mL lidocaine 1 %; 10 mg triamcinolone acetonide 40 MG/ML Medications #2:  0.5 mL lidocaine 1 %; 10 mg triamcinolone acetonide 40 MG/ML  Patient tolerance:  Patient tolerated the procedure well with no immediate complications Comments: After informed consent was obtained the site was prepped in usual sterile fashion and bilateral trapezius muscles were injected with 10 mg of Kenalog mixed with 0.5 and also 1% lidocaine. Patient tolerated procedure well. There are no complications. About 5 minutes after the injection patient states that she is about 75% improved. She had much better range of motion of her neck.  I had asked the patient to bring in a copy of x-rays on the distal she had done at her chiropractor's office but this will not be necessary at this time since it looks like most of her pain was coming from trapezius muscle spasms.  In addition patient will be getting prednisone taper. This will be addressing her neck pain even further as well as her left greater trochanter bursa pain.  In addition I have stopped her Robaxin and given her baclofen which will be helping her some.  I've also stopped her Flexeril and switch her Zanaflex which will be helping her muscle spasms.   Allergies: Cephalexin; Penicillins; and Erythromycin    Assessment / Plan:     Visit Diagnoses: Fibromyalgia  Alopecia, scarring  Insomnia, unspecified type  Fatigue, unspecified type  Trapezius muscle spasm    Patient is having a lot of neck muscle spasms therefore I gave her cortisone injection 10 mg of Kenalog mixed with 0.5 and also 1% lidocaine. Patient tolerated procedure well. Note that she had 50% improvement couple of minutes after the injections and she is very happy with that. She's been suffering from the neck muscle spasms/torticollis for approximately 4 weeks and has had adjustments done by the chiropractic doctor with 25% relief.  Patient also had left greater trochanter bursa injection by Arlys JohnBrian on 07/04/2016. She states that she received no relief with that at all.  To address that problem we decided to give her a prednisone taper.  We will also stop her Robaxin and switch her to baclofen 10 mg twice a day thirty-day supply with 2 refills (for daytime use only).  We will stop her Flexeril and give her Zanaflex 4 mg daily at bedtime thirty-day supply with 2 refills  I'll also add prednisone 5 mg 4 pills for 4 days, 3 pills for 4 days, 2 pills for 4 days, 1 pill for 4 days, half pill for 4 days then stop. Patient will start the medication tomorrow  Patient will get us a copy of her x-ray report but it is not necessary at this time since 75% of pain is improved after giving her cortisone injection in the trapezius muscle areas 2.  Return to clinic in 6 months Alopecia, Scarring ===> Biopsy proven By Dr. Donzetta Starchrew Jones Nov 2017; scalp and other places losing hair; f-u [facility] for further eval and tx;    Orders: Orders Placed This Encounter  Procedures  . Trigger Point Injection   Meds ordered this encounter  Medications  . predniSONE (DELTASONE) 5 MG tablet    Sig: 4po qAM x 4 days,  3po qAM x 4 days, 2po qAM x 4 days, 1po qAM x 4 days, 1/2po qAM x 4 days, then stop.  Take until all gone and exactly as Rx'd.     Dispense:  42 tablet    Refill:  0    Order Specific Question:   Supervising Provider    Answer:   Pollyann SavoyEVESHWAR, SHAILI [[id number]]  . tiZANidine (ZANAFLEX) 4 MG tablet    Sig:  Take 1 tablet (4 mg total) by mouth at bedtime.    Dispense:  30 tablet    Refill:  2    Order Specific Question:   Supervising Provider    Answer:   Pollyann Savoy [[id number]]  . baclofen (LIORESAL) 10 MG tablet    Sig: Take 1 tablet (10 mg total) by mouth 2 (two) times daily.    Dispense:  60 each    Refill:  2    Order Specific Question:   Supervising Provider    Answer:   Pollyann Savoy [[id number]]   Note that Zanaflex as a substitute for Flexeril so she will stop the Flexeril if she takes Zanaflex. Baclofen as a substitute for Robaxin she'll she will stop the Robaxin when she takes the baclofen. Patient already knows this and will comply. Face-to-face time spent with patient was 30 minutes. 50% of time was spent in counseling and coordination of care.  Follow-Up Instructions: Return in about 6 months (around 03/21/2017).   Tawni Pummel, PA-C   I examined and evaluated the patient with Tawni Pummel PA. The plan of care was discussed as noted above.  Pollyann Savoy, MD

## 2016-09-25 ENCOUNTER — Ambulatory Visit: Payer: Self-pay | Admitting: Rheumatology

## 2017-01-15 ENCOUNTER — Ambulatory Visit: Payer: Medicare Other | Admitting: Rheumatology

## 2017-01-15 ENCOUNTER — Ambulatory Visit (INDEPENDENT_AMBULATORY_CARE_PROVIDER_SITE_OTHER): Payer: Medicare Other

## 2017-01-15 ENCOUNTER — Encounter: Payer: Self-pay | Admitting: Rheumatology

## 2017-01-15 ENCOUNTER — Ambulatory Visit (INDEPENDENT_AMBULATORY_CARE_PROVIDER_SITE_OTHER): Payer: Medicare Other | Admitting: Rheumatology

## 2017-01-15 VITALS — BP 136/89 | HR 83 | Resp 14 | Ht 64.0 in | Wt 201.0 lb

## 2017-01-15 DIAGNOSIS — M25552 Pain in left hip: Secondary | ICD-10-CM

## 2017-01-15 DIAGNOSIS — M25512 Pain in left shoulder: Secondary | ICD-10-CM | POA: Diagnosis not present

## 2017-01-15 DIAGNOSIS — M797 Fibromyalgia: Secondary | ICD-10-CM | POA: Diagnosis not present

## 2017-01-15 DIAGNOSIS — M62838 Other muscle spasm: Secondary | ICD-10-CM | POA: Insufficient documentation

## 2017-01-15 DIAGNOSIS — M3500 Sicca syndrome, unspecified: Secondary | ICD-10-CM

## 2017-01-15 DIAGNOSIS — R768 Other specified abnormal immunological findings in serum: Secondary | ICD-10-CM | POA: Diagnosis not present

## 2017-01-15 DIAGNOSIS — R5383 Other fatigue: Secondary | ICD-10-CM | POA: Diagnosis not present

## 2017-01-15 DIAGNOSIS — L669 Cicatricial alopecia, unspecified: Secondary | ICD-10-CM | POA: Diagnosis not present

## 2017-01-15 DIAGNOSIS — G4709 Other insomnia: Secondary | ICD-10-CM

## 2017-01-15 LAB — COMPLETE METABOLIC PANEL WITH GFR
ALBUMIN: 4 g/dL (ref 3.6–5.1)
ALK PHOS: 82 U/L (ref 33–130)
ALT: 22 U/L (ref 6–29)
AST: 22 U/L (ref 10–35)
BUN: 17 mg/dL (ref 7–25)
CALCIUM: 9.2 mg/dL (ref 8.6–10.4)
CO2: 25 mmol/L (ref 20–31)
CREATININE: 0.82 mg/dL (ref 0.60–0.93)
Chloride: 106 mmol/L (ref 98–110)
GFR, Est African American: 84 mL/min (ref >=60)
GFR, Est Non African American: 73 mL/min (ref >=60)
Glucose, Bld: 96 mg/dL (ref 65–99)
Potassium: 4.6 mmol/L (ref 3.5–5.3)
Sodium: 140 mmol/L (ref 135–146)
TOTAL PROTEIN: 6.4 g/dL (ref 6.1–8.1)
Total Bilirubin: 0.3 mg/dL (ref 0.2–1.2)

## 2017-01-15 LAB — CBC WITH DIFFERENTIAL/PLATELET
BASOS PCT: 1 %
Basophils Absolute: 76 cells/uL (ref 0–200)
Eosinophils Absolute: 380 cells/uL (ref 15–500)
Eosinophils Relative: 5 %
HEMATOCRIT: 41.1 % (ref 35.0–45.0)
HEMOGLOBIN: 13.4 g/dL (ref 11.7–15.5)
LYMPHS ABS: 2432 {cells}/uL (ref 850–3900)
Lymphocytes Relative: 32 %
MCH: 29.8 pg (ref 27.0–33.0)
MCHC: 32.6 g/dL (ref 32.0–36.0)
MCV: 91.3 fL (ref 80.0–100.0)
MONO ABS: 456 {cells}/uL (ref 200–950)
MPV: 9.7 fL (ref 7.5–12.5)
Monocytes Relative: 6 %
NEUTROS PCT: 56 %
Neutro Abs: 4256 cells/uL (ref 1500–7800)
Platelets: 232 10*3/uL (ref 140–400)
RBC: 4.5 MIL/uL (ref 3.80–5.10)
RDW: 13.6 % (ref 11.0–15.0)
WBC: 7.6 10*3/uL (ref 3.8–10.8)

## 2017-01-15 NOTE — Progress Notes (Signed)
Office Visit Note  Patient: Susan Carpenter             Date of Birth: [birth year]           MRN: [id number]             PCP: Gerrit Heck, MD Referring: Leighton Ruff, MD Visit Date: 01/15/2017 Occupation: @GUAROCC @    Subjective:  Pain of the Left Hip and Pain of the Left Shoulder Patient also wants to be checked for Sjogren's today since she has ongoing dry eyes and dry mouth for years  History of Present Illness: Susan Carpenter is a 71 y.o. female   Patient is been having left hip pain and left shoulder joint pain. She feels that she would benefit from an x-ray. She has not had any falls or injuries. Her left hip pain is felt in the inguinal area at times. Her left shoulder joint pain keeps her from doing activities of daily living.  Patient also has years of dry eyes and dry mouth. 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  She also has seen the [facility] 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 They did a ANA on 10/24/2016 and found it to be positive with a titer of 1:160 with a homogenous pattern and then a repeat test showed a speckled pattern and a titer of 1:160  Patient also has fibromyalgia and she is currently hurting everywhere especially the left greater trochanter bursa.    Activities of Daily Living:  Patient reports morning stiffness for 60 minutes.   Patient Reports nocturnal pain.  Difficulty dressing/grooming: Reports Difficulty climbing stairs: Reports Difficulty getting out of chair: Reports Difficulty using hands for taps, buttons, cutlery, and/or writing: Reports   Review of Systems  Constitutional: Positive for fatigue.  HENT: Negative for mouth sores and mouth dryness.   Eyes: Negative for dryness.  Respiratory: Negative for shortness of breath.   Gastrointestinal: Negative for constipation and diarrhea.    Musculoskeletal: Positive for myalgias and myalgias.  Skin: Negative for sensitivity to sunlight.  Psychiatric/Behavioral: Positive for sleep disturbance. Negative for decreased concentration.    PMFS History:  Patient Active Problem List   Diagnosis Date Noted  . Other insomnia 01/15/2017  . Other fatigue 01/15/2017  . Trapezius muscle spasm 01/15/2017  . 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 Primary osteoarthritis of both hands 09/19/2016  . OA (osteoarthritis) of knee 08/01/2015  . INTERSTITIAL CYSTITIS 01/31/2010  . Fibromyalgia 01/31/2010  . FATIGUE 01/31/2010  . DYSPNEA/SHORTNESS OF BREATH 01/31/2010  . FREQUENCY, URINARY 01/31/2010  . Depression 01/24/2009  . Migraines 09/04/2008  . HELICOBACTER PYLORI INFECTION 10/14/2007  . ALLERGIC RHINITIS CAUSE UNSPECIFIED 10/14/2007  . HIATAL HERNIA 10/14/2007  . ANA POSITIVE 10/14/2007  . Hypothyroidism 10/10/2007  . PAIN IN JOINT, SITE UNSPECIFIED 10/10/2007  . WEIGHT GAIN 10/10/2007    Past Medical History:  Diagnosis Date  . Allergy    rhinitis  . ANA positive   . Arthritis   . Complication of anesthesia   . Constipation   . Fibromyalgia   . GERD (gastroesophageal reflux disease)   . Heart murmur   . Helicobacter pylori (H. pylori) infection    s/p treatment  . Hiatal hernia   . Interstitial cystitis   . Pain in joint    site unspecified  . PONV (postoperative nausea and vomiting)   .  Psoriasis   . Psoriatic arthritis ([place])     Family History  Problem Relation Age of Onset  . Diabetes Father   . Heart disease Father     MI  . Alcohol abuse Father   . Cancer Mother     COLON   . Mental illness Mother     ALZHEIMERS; DEPRESSION  . Thyroid disease Mother     HYPOTHYROID  . Alcohol abuse Maternal Uncle   . Diabetes Maternal Uncle   . Hypertension Maternal Grandmother   . Stroke Maternal Grandmother    Past Surgical History:  Procedure Laterality Date  . BLADDER SURGERY     x2 for scar tissue  .  CHOLECYSTECTOMY  1995  . cysts on ovaries    . polyp removal  1984   from fallopian tube  . THUMB SURGERY Right 01/16/2016  . TOTAL KNEE ARTHROPLASTY Right 08/01/2015   Procedure: RIGHT TOTAL KNEE ARTHROPLASTY;  Surgeon: Gaynelle Arabian, MD;  Location: [facility];  Service: Orthopedics;  Laterality: Right;  . TUBAL LIGATION     Social History   Social History Narrative  . No narrative on file     Objective: Vital Signs: BP 136/89   Pulse 83   Resp 14   Ht 5' 4"  (1.626 m)   Wt 201 lb (91.2 kg)   BMI 34.50 kg/m    Physical Exam  Constitutional: She is oriented to person, place, and time. She appears well-developed and well-nourished.  HENT:  Head: Normocephalic and atraumatic.  Eyes: EOM are normal. Pupils are equal, round, and reactive to light.  Cardiovascular: Normal rate, regular rhythm and normal heart sounds.  Exam reveals no gallop and no friction rub.   No murmur heard. Pulmonary/Chest: Effort normal and breath sounds normal. She has no wheezes. She has no rales.  Abdominal: Soft. Bowel sounds are normal. She exhibits no distension. There is no tenderness. There is no guarding. No hernia.  Musculoskeletal: Normal range of motion. She exhibits no edema, tenderness or deformity.  Lymphadenopathy:    She has no cervical adenopathy.  Neurological: She is alert and oriented to person, place, and time. Coordination normal.  Skin: Skin is warm and dry. Capillary refill takes less than 2 seconds. No rash noted.  Psychiatric: She has a normal mood and affect. Her behavior is normal.  Nursing note and vitals reviewed.    Musculoskeletal Exam:  Full range of motion of all joints except patient does do it with pain and discomfort especially the left shoulder joint Grip strength is equal and strong bilaterally For myalgia tender points are 18 out of 18 positive  CDAI Exam: CDAI Homunculus Exam:   Joint Counts:  CDAI Tender Joint count: 0 CDAI Swollen Joint count: 0  Global  Assessments:  Patient Global Assessment: 10 Provider Global Assessment: 10  CDAI Calculated Score: 20    Investigation: No additional findings. No visits with results within 6 Month(s) from this visit.  Latest known visit with results is:  Admission on 08/01/2015, Discharged on 08/04/2015  Component Date Value Ref Range Status  . ABO/RH(D) 08/01/2015 O POS   Final  . Antibody Screen 08/01/2015 POS   Final  . Sample Expiration 08/01/2015 08/04/2015   Final  . Unit Number 08/01/2015 [id number]   Final  . Blood Component Type 08/01/2015 RED CELLS,LR   Final  . Unit division 08/01/2015 00   Final  . Status of Unit 08/01/2015 REL FROM [facility]   Final  . Donor AG Type  08/01/2015 NEGATIVE FOR E ANTIGEN   Final  . Transfusion Status 08/01/2015 OK TO TRANSFUSE   Final  . Crossmatch Result 08/01/2015 COMPATIBLE   Final  . Unit Number 08/01/2015 [id number]   Final  . Blood Component Type 08/01/2015 RED CELLS,LR   Final  . Unit division 08/01/2015 00   Final  . Status of Unit 08/01/2015 REL FROM [facility]   Final  . Donor AG Type 08/01/2015 NEGATIVE FOR E ANTIGEN   Final  . Transfusion Status 08/01/2015 OK TO TRANSFUSE   Final  . Crossmatch Result 08/01/2015 COMPATIBLE   Final  . WBC 08/02/2015 12.4* 4.0 - 10.5 K/uL Final  . RBC 08/02/2015 3.68* 3.87 - 5.11 MIL/uL Final  . Hemoglobin 08/02/2015 11.3* 12.0 - 15.0 g/dL Final  . HCT 08/02/2015 34.0* 36.0 - 46.0 % Final  . MCV 08/02/2015 92.4  78.0 - 100.0 fL Final  . MCH 08/02/2015 30.7  26.0 - 34.0 pg Final  . MCHC 08/02/2015 33.2  30.0 - 36.0 g/dL Final  . RDW 08/02/2015 12.9  11.5 - 15.5 % Final  . Platelets 08/02/2015 208  150 - 400 K/uL Final  . Sodium 08/02/2015 131* 135 - 145 mmol/L Final  . Potassium 08/02/2015 4.3  3.5 - 5.1 mmol/L Final  . Chloride 08/02/2015 100* 101 - 111 mmol/L Final  . CO2 08/02/2015 24  22 - 32 mmol/L Final  . Glucose, Bld 08/02/2015 119* 65 - 99 mg/dL Final  . BUN 08/02/2015 13  6 - 20 mg/dL Final  .  Creatinine, Ser 08/02/2015 0.70  0.44 - 1.00 mg/dL Final  . Calcium 08/02/2015 8.4* 8.9 - 10.3 mg/dL Final  . GFR calc non Af Amer 08/02/2015 >60  >60 mL/min Final  . GFR calc Af Amer 08/02/2015 >60  >60 mL/min Final   Comment: (NOTE) The eGFR has been calculated using the CKD EPI equation. This calculation has not been validated in all clinical situations. eGFR's persistently <60 mL/min signify possible Chronic Kidney Disease.   . Anion gap 08/02/2015 7  5 - 15 Final  . WBC 08/03/2015 14.0* 4.0 - 10.5 K/uL Final  . RBC 08/03/2015 3.53* 3.87 - 5.11 MIL/uL Final  . Hemoglobin 08/03/2015 10.7* 12.0 - 15.0 g/dL Final  . HCT 08/03/2015 32.3* 36.0 - 46.0 % Final  . MCV 08/03/2015 91.5  78.0 - 100.0 fL Final  . MCH 08/03/2015 30.3  26.0 - 34.0 pg Final  . MCHC 08/03/2015 33.1  30.0 - 36.0 g/dL Final  . RDW 08/03/2015 13.1  11.5 - 15.5 % Final  . Platelets 08/03/2015 210  150 - 400 K/uL Final  . Sodium 08/03/2015 136  135 - 145 mmol/L Final  . Potassium 08/03/2015 3.9  3.5 - 5.1 mmol/L Final  . Chloride 08/03/2015 102  101 - 111 mmol/L Final  . CO2 08/03/2015 29  22 - 32 mmol/L Final  . Glucose, Bld 08/03/2015 113* 65 - 99 mg/dL Final  . BUN 08/03/2015 13  6 - 20 mg/dL Final  . Creatinine, Ser 08/03/2015 0.70  0.44 - 1.00 mg/dL Final  . Calcium 08/03/2015 8.4* 8.9 - 10.3 mg/dL Final  . GFR calc non Af Amer 08/03/2015 >60  >60 mL/min Final  . GFR calc Af Amer 08/03/2015 >60  >60 mL/min Final   Comment: (NOTE) The eGFR has been calculated using the CKD EPI equation. This calculation has not been validated in all clinical situations. eGFR's persistently <60 mL/min signify possible Chronic Kidney Disease.   . Anion gap  08/03/2015 5  5 - 15 Final  . WBC 08/04/2015 9.7  4.0 - 10.5 K/uL Final  . RBC 08/04/2015 3.53* 3.87 - 5.11 MIL/uL Final  . Hemoglobin 08/04/2015 10.8* 12.0 - 15.0 g/dL Final  . HCT 08/04/2015 32.8* 36.0 - 46.0 % Final  . MCV 08/04/2015 92.9  78.0 - 100.0 fL Final  . MCH  08/04/2015 30.6  26.0 - 34.0 pg Final  . MCHC 08/04/2015 32.9  30.0 - 36.0 g/dL Final  . RDW 08/04/2015 13.5  11.5 - 15.5 % Final  . 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    Procedures:  No procedures performed Allergies: Cephalexin; Penicillins; and Erythromycin   Assessment / Plan:     Visit Diagnoses: Fibromyalgia  Alopecia, scarring - diagnosed by [facility]; wants to offer PLQ to pt.  Sicca syndrome [facility]) - January 15, 2017 ==> Years of Dry Eyes & Mouth; wants evaluation for Sjogrens  Acute pain of left shoulder - January 15, 2017 ==> exacerbated after exercising at [place] 2017;  - Plan: XR Shoulder Left  Other insomnia  Other fatigue - Plan: [id number] ENA PANEL, CBC with Differential/Platelet, COMPLETE METABOLIC PANEL WITH GFR  Trapezius muscle spasm  Pain in left hip - Plan: XR HIP UNILAT W OR W/O PELVIS 2-3 VIEWS LEFT  Elevated antinuclear antibody (ANA) level - Plan: [id number] ENA PANEL   Plan: #1: Return to clinic in 3 months #2: History of dry eyes and dry mouth. Advised patient to try over-the-counter products like Biotene and Systane #3: Patient wants to have labs done to rule out Sjogren's.: Labs: ENA #4: Labs: CBC with differential CMP with GFR in addition to the ENA to be done today #5: See procedure for the left greater trochanter bursa injection with 40 mg of Kenalog mixed with one half mL's  1% lidocaine #6: Patient's Dr. Jarrett Soho diagnosed with alopecia from her autoimmune disease and they wanted put her on Plaquenil. If patient does have Sjogren's, this Plaquenil will help that out as well. She may benefit from 200 mg twice a day Monday through Friday #7: Fibromyalgia, fatigue, insomnia are poorly controlled and patient. She may benefit from exercise, muscle relaxers, SSRI if appropriate. I will discuss this with the patient at her follow-up in 3 months. She will try conservative approach of water aerobics as tolerated and increase gradually to a maximum of 150 minutes per week broken down to 50 minutes 3 times a week In the meanwhile patient should continue baclofen as prescribed twice a day  Orders: Orders Placed This Encounter  Procedures  . XR Shoulder Left  . XR HIP UNILAT W OR W/O PELVIS 2-3 VIEWS LEFT  . [id number] ENA PANEL  . CBC with Differential/Platelet  . COMPLETE METABOLIC PANEL WITH GFR   No orders of the defined types were placed in this encounter.   Face-to-face time spent with patient was 60 minutes. 50%  of time was spent in counseling and coordination of care.  Follow-Up Instructions: Return in about 3 months (around 04/17/2017).   Eliezer Lofts, PA-C  Note - This record has been created using [employer].  Chart creation errors have been sought, but may not always  have been located. Such creation errors do not reflect on  the standard of medical care.

## 2017-01-16 LAB — CP5000020 ENA PANEL
ENA SM Ab Ser-aCnc: <1
Ribonucleic Protein(ENA) Antibody, IgG: <1
SCLERODERMA (SCL-70) (ENA) ANTIBODY, IGG: NEGATIVE
SSA (Ro) (ENA) Antibody, IgG: <1
SSB (La) (ENA) Antibody, IgG: <1
ds DNA Ab: <1 IU/mL

## 2017-04-16 NOTE — Progress Notes (Signed)
Office Visit Note  Patient: Susan Carpenter             Date of Birth: 11/26/1945           MRN: [id number]             PCP: Leighton Ruff, MD Referring: Leighton Ruff, MD Visit Date: 04/17/2017 Occupation: @GUAROCC @    Subjective:  Shoulder Pain (follow up, left shoulder pain)   History of Present Illness: Susan Carpenter is a 71 y.o. female  Last seen 09/21/2016. On the last visit in November, patient's fibromyalgia discomfort is rated 8-9 on a scale of 0-10 and her fatigue is rated 8-9 on a scale of 0-10. For her bilateral trapezius muscle spasm, I gave her 10 mg of Kenalog mixed with 0.5 mL's of 1% lidocaine with good relief.  We switched her from Robaxin to baclofen the last visit. In addition, we switched her from Flexeril to Zanaflex for bedtime use. We also gave her prednisone taper since to cortisone injection to the left greater trochanteric bursa in August 2017 was not effective.  [place] is rated 5-6 on FMS pain scale. Her main problem is her left shoulder joint. She's having significant pain that she describes as 8-9 on a scale of 0-10. She hears pops and snaps with range of motion of the left shoulder. Patient is requesting a cortisone injection.  Patient also states that the trapezius injections given to her at the last visit helped her some but did not last too long. I offered her lidocaine only injections today and she is agreeable.   Activities of Daily Living:  Patient reports morning stiffness for 15 minutes.   Patient Reports nocturnal pain.  Difficulty dressing/grooming: Reports Difficulty climbing stairs: Reports Difficulty getting out of chair: Reports Difficulty using hands for taps, buttons, cutlery, and/or writing: Reports   Review of Systems  Constitutional: Positive for fatigue.  HENT: Negative for mouth sores and mouth dryness.   Eyes: Negative for dryness.  Respiratory: Negative for shortness of breath.   Gastrointestinal:  Negative for constipation and diarrhea.  Musculoskeletal: Positive for myalgias and myalgias.  Skin: Negative for sensitivity to sunlight.  Psychiatric/Behavioral: Positive for sleep disturbance. Negative for decreased concentration.    PMFS History:  Patient Active Problem List   Diagnosis Date Noted  . Other insomnia 01/15/2017  . Other fatigue 01/15/2017  . Trapezius muscle spasm 01/15/2017  . 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 Primary osteoarthritis of both hands 09/19/2016  . OA (osteoarthritis) of knee 08/01/2015  . INTERSTITIAL CYSTITIS 01/31/2010  . Fibromyalgia 01/31/2010  . FATIGUE 01/31/2010  . DYSPNEA/SHORTNESS OF BREATH 01/31/2010  . FREQUENCY, URINARY 01/31/2010  . Depression 01/24/2009  . Migraines 09/04/2008  . HELICOBACTER PYLORI INFECTION 10/14/2007  . ALLERGIC RHINITIS CAUSE UNSPECIFIED 10/14/2007  . HIATAL HERNIA 10/14/2007  . ANA POSITIVE 10/14/2007  . Hypothyroidism 10/10/2007  . PAIN IN JOINT, SITE UNSPECIFIED 10/10/2007  . WEIGHT GAIN 10/10/2007    Past Medical History:  Diagnosis Date  . Allergy    rhinitis  . ANA positive   . Arthritis   . Complication of anesthesia   . Constipation   . Fibromyalgia   . GERD (gastroesophageal reflux disease)   . Heart murmur   . Helicobacter pylori (H. pylori) infection    s/p treatment  . Hiatal hernia   . Interstitial cystitis   . Pain in joint    site unspecified  . PONV (postoperative nausea and vomiting)   . Psoriasis   .  Psoriatic arthritis ([place])     Family History  Problem Relation Age of Onset  . Diabetes Father   . Heart disease Father        MI  . Alcohol abuse Father   . Cancer Mother        COLON   . Mental illness Mother        ALZHEIMERS; DEPRESSION  . Thyroid disease Mother        HYPOTHYROID  . Alcohol abuse Maternal Uncle   . Diabetes Maternal Uncle   . Hypertension Maternal Grandmother   . Stroke Maternal Grandmother    Past Surgical History:  Procedure Laterality Date  .  BLADDER SURGERY     x2 for scar tissue  . CHOLECYSTECTOMY  1995  . cysts on ovaries    . polyp removal  1984   from fallopian tube  . THUMB SURGERY Right 01/16/2016  . TOTAL KNEE ARTHROPLASTY Right 08/01/2015   Procedure: RIGHT TOTAL KNEE ARTHROPLASTY;  Surgeon: Gaynelle Arabian, MD;  Location: [facility];  Service: Orthopedics;  Laterality: Right;  . TUBAL LIGATION     Social History   Social History Narrative  . No narrative on file     Objective: Vital Signs: BP (!) 143/78 (BP Location: Left Arm, Patient Position: Sitting, Cuff Size: Normal)   Pulse 76   Ht 5' 4"  (1.626 m)   Wt 201 lb (91.2 kg)   BMI 34.50 kg/m    Physical Exam  Constitutional: She is oriented to person, place, and time. She appears well-developed and well-nourished.  HENT:  Head: Normocephalic and atraumatic.  Eyes: EOM are normal. Pupils are equal, round, and reactive to light.  Cardiovascular: Normal rate, regular rhythm and normal heart sounds.  Exam reveals no gallop and no friction rub.   No murmur heard. Pulmonary/Chest: Effort normal and breath sounds normal. She has no wheezes. She has no rales.  Abdominal: Soft. Bowel sounds are normal. She exhibits no distension. There is no tenderness. There is no guarding. No hernia.  Musculoskeletal: Normal range of motion. She exhibits no edema, tenderness or deformity.  Lymphadenopathy:    She has no cervical adenopathy.  Neurological: She is alert and oriented to person, place, and time. Coordination normal.  Skin: Skin is warm and dry. Capillary refill takes less than 2 seconds. No rash noted.  Psychiatric: She has a normal mood and affect. Her behavior is normal.  Nursing note and vitals reviewed.    Musculoskeletal Exam:  Full range of motion of all joints Grip strength is equal and strong bilaterally Fibromyalgia tender points are 18 out of 18 positive (Patient generally has more pain on the left side of her body including left shoulder, left greater  trochanter bursa).   CDAI Exam: No CDAI exam completed.  No synovitis on examination  Investigation: No additional findings. Office Visit on 01/15/2017  Component Date Value Ref Range Status  . ds DNA Ab 01/15/2017 <1  IU/mL Final   Comment:                                 IU/mL       Interpretation                               < or = 4    Negative  5-9         Indeterminate                               > or = 10   Positive     . Ribonucleic Protein(ENA) Antibody,* 01/15/2017 <1.0 NEG  <1.0 NEG AI Final  . Scleroderma (Scl-70) (ENA) Antibod* 01/15/2017 <1.0 NEG  <1.0 NEG AI Final  . SSA (Ro) (ENA) Antibody, IgG 01/15/2017 <1.0 NEG  <1.0 NEG AI Final  . SSB (La) (ENA) Antibody, IgG 01/15/2017 <1.0 NEG  <1.0 NEG AI Final  . ENA SM Ab Ser-aCnc 01/15/2017 <1.0 NEG  <1.0 NEG AI Final  . WBC 01/15/2017 7.6  3.8 - 10.8 K/uL Final  . RBC 01/15/2017 4.50  3.80 - 5.10 MIL/uL Final  . Hemoglobin 01/15/2017 13.4  11.7 - 15.5 g/dL Final  . HCT 01/15/2017 41.1  35.0 - 45.0 % Final  . MCV 01/15/2017 91.3  80.0 - 100.0 fL Final  . MCH 01/15/2017 29.8  27.0 - 33.0 pg Final  . MCHC 01/15/2017 32.6  32.0 - 36.0 g/dL Final  . RDW 01/15/2017 13.6  11.0 - 15.0 % Final  . Platelets 01/15/2017 232  140 - 400 K/uL Final  . MPV 01/15/2017 9.7  7.5 - 12.5 fL Final  . Neutro Abs 01/15/2017 4256  1,500 - 7,800 cells/uL Final  . Lymphs Abs 01/15/2017 2432  850 - 3,900 cells/uL Final  . Monocytes Absolute 01/15/2017 456  200 - 950 cells/uL Final  . Eosinophils Absolute 01/15/2017 380  15 - 500 cells/uL Final  . Basophils Absolute 01/15/2017 76  0 - 200 cells/uL Final  . Neutrophils Relative % 01/15/2017 56  % Final  . Lymphocytes Relative 01/15/2017 32  % Final  . Monocytes Relative 01/15/2017 6  % Final  . Eosinophils Relative 01/15/2017 5  % Final  . Basophils Relative 01/15/2017 1  % Final  . Smear Review 01/15/2017 Criteria for review not met   Final  . Sodium  01/15/2017 140  135 - 146 mmol/L Final  . Potassium 01/15/2017 4.6  3.5 - 5.3 mmol/L Final  . Chloride 01/15/2017 106  98 - 110 mmol/L Final  . CO2 01/15/2017 25  20 - 31 mmol/L Final  . Glucose, Bld 01/15/2017 96  65 - 99 mg/dL Final  . BUN 01/15/2017 17  7 - 25 mg/dL Final  . Creat 01/15/2017 0.82  0.60 - 0.93 mg/dL Final   Comment:   For patients > or = 71 years of age: The upper reference limit for Creatinine is approximately 13% higher for people identified as African-American.     . Total Bilirubin 01/15/2017 0.3  0.2 - 1.2 mg/dL Final  . Alkaline Phosphatase 01/15/2017 82  33 - 130 U/L Final  . AST 01/15/2017 22  10 - 35 U/L Final  . ALT 01/15/2017 22  6 - 29 U/L Final  . Total Protein 01/15/2017 6.4  6.1 - 8.1 g/dL Final  . Albumin 01/15/2017 4.0  3.6 - 5.1 g/dL Final  . Calcium 01/15/2017 9.2  8.6 - 10.4 mg/dL Final  . GFR, Est African American 01/15/2017 84  >=60 mL/min Final  . GFR, Est Non African American 01/15/2017 73  >=60 mL/min Final     Imaging: No results found.      Speciality Comments: No specialty comments available.    Procedures:  Trigger Point Inj Date/Time: 04/17/2017 10:41 AM Performed by: Carlyon Shadow,  Adilenne Ashworth Authorized by: Eliezer Lofts   Consent Given by:  Patient Site marked: the procedure site was marked   Timeout: prior to procedure the correct patient, procedure, and site was verified   Indications:  Muscle spasm and pain Total # of Trigger Points:  2 Location: neck   Needle Size:  27 G Approach:  Dorsal Medications #1:  0.5 mL lidocaine (PF) 1 % Medications #2:  0.5 mL lidocaine (PF) 1 % Patient tolerance:  Patient tolerated the procedure well with no immediate complications Comments: 99/37/1696: Lidocaine only injection to bilateral trapezius muscle  Large Joint Inj Date/Time: 04/17/2017 11:31 AM Performed by: Bo Merino Authorized by: Eliezer Lofts   Consent Given by:  Patient Site marked: the procedure site was  marked   Timeout: prior to procedure the correct patient, procedure, and site was verified   Indications:  Pain Location:  Shoulder Site:  L subacromial bursa Prep: patient was prepped and draped in usual sterile fashion   Needle Size:  27 G Needle Length:  1.5 inches Approach:  Posterior Ultrasound Guidance: No   Fluoroscopic Guidance: No   Arthrogram: No   Medications:  40 mg triamcinolone acetonide 40 MG/ML; 1 mL lidocaine (PF) 1 % Aspiration Attempted: Yes   Aspirate amount (mL):  0 Patient tolerance:  Patient tolerated the procedure well with no immediate complications  Left anterior shoulder joint pain.  Injected by dr. Estanislado Pandy using anterior approach.  Pt tolerated procedure well.  No complication.   Allergies: Cephalexin; Oyster shell; Penicillins; Shellfish allergy; and Erythromycin   Assessment / Plan:     Visit Diagnoses: Fibromyalgia  Other insomnia  Other fatigue  Primary osteoarthritis of both knees  Trapezius muscle spasm - 04/17/2017: Lidocaine only injection to bilateral trapezius muscle  Acute pain of left shoulder    Plan: #1: Fibromyalgia. Active disease with generalized pain and 18 out of 18 tender points.  #2: Insomnia and fatigue. Ongoing.   #3: [facility] to [facility] for eval and tx for hip/pelvic pain and discomfort.  Left hip pain.  #4: Bilateral trapezius muscle spasms. Injected with 0.5 mL's of lidocaine without epinephrine  #5: Left shoulder joint pain at the anterior aspect of shoulder. At insertion of biceps tendon. Able to have full range of motion but there are popping snapping sounds with range of motion. Patient states that pain is severe and she is unable to lift items with her left hand due to pain in the left shoulder. She will see an orthopedic surgeon but wants to know if we can give her cortisone injection today for appropriate.  #6: Return to clinic in 6 months  #7: Patient's labs are from March 2018 are within normal  limits. The include CBC with differential and CMP with GFR; autoimmune workup was also done shown below and was negative ==> Component Date Value Ref Range Status  . ds DNA Ab 01/15/2017 <1  IU/mL Final   Comment:                                 IU/mL       Interpretation                               < or = 4    Negative  5-9         Indeterminate                               > or = 10   Positive     . Ribonucleic Protein(ENA) Antibody,* 01/15/2017 <1.0 NEG  <1.0 NEG AI Final  . Scleroderma (Scl-70) (ENA) Antibod* 01/15/2017 <1.0 NEG  <1.0 NEG AI Final  . SSA (Ro) (ENA) Antibody, IgG 01/15/2017 <1.0 NEG  <1.0 NEG AI Final  . SSB (La) (ENA) Antibody, IgG 01/15/2017 <1.0 NEG  <1.0 NEG AI Final  . ENA SM Ab Ser-aCnc 01/15/2017 <1.0 NEG  <1.0 NEG AI Final    Orders: Orders Placed This Encounter  Procedures  . Trigger Point Injection   Meds ordered this encounter  Medications  . diclofenac sodium (VOLTAREN) 1 % GEL    Sig: Voltaren Gel 3 grams to 3 large joints upto TID 3 TUBES with 3 refills    Dispense:  3 Tube    Refill:  3    Voltaren Gel 3 grams to 3 large joints upto TID 3 TUBES with 3 refills    Order Specific Question:   Supervising Provider    Answer:   Lyda Perone    Face-to-face time spent with patient was 30 minutes. 50% of time was spent in counseling and coordination of care.  Follow-Up Instructions: Return in about 6 months (around 10/17/2017) for Dent, [place], .   Eliezer Lofts, PA-C   I examined and evaluated the patient with Eliezer Lofts PA. The plan of care was discussed as noted above.  Bo Merino, MD  Note - This record has been created using Editor, commissioning.  Chart creation errors have been sought, but may not always  have been located. Such creation errors do not reflect on  the standard of medical care.

## 2017-04-17 ENCOUNTER — Encounter: Payer: Self-pay | Admitting: Rheumatology

## 2017-04-17 ENCOUNTER — Ambulatory Visit (INDEPENDENT_AMBULATORY_CARE_PROVIDER_SITE_OTHER): Payer: Medicare Other | Admitting: Rheumatology

## 2017-04-17 VITALS — BP 143/78 | HR 76 | Ht 64.0 in | Wt 201.0 lb

## 2017-04-17 DIAGNOSIS — M25512 Pain in left shoulder: Secondary | ICD-10-CM

## 2017-04-17 DIAGNOSIS — M170 Bilateral primary osteoarthritis of knee: Secondary | ICD-10-CM

## 2017-04-17 DIAGNOSIS — M797 Fibromyalgia: Secondary | ICD-10-CM

## 2017-04-17 DIAGNOSIS — G4709 Other insomnia: Secondary | ICD-10-CM

## 2017-04-17 DIAGNOSIS — M62838 Other muscle spasm: Secondary | ICD-10-CM

## 2017-04-17 DIAGNOSIS — R5383 Other fatigue: Secondary | ICD-10-CM

## 2017-04-17 MED ORDER — TRIAMCINOLONE ACETONIDE 40 MG/ML IJ SUSP
40.0000 mg | INTRAMUSCULAR | Status: AC | PRN
  Administered 2017-04-17: 40 mg via INTRA_ARTICULAR

## 2017-04-17 MED ORDER — LIDOCAINE HCL (PF) 1 % IJ SOLN
1.0000 mL | INTRAMUSCULAR | Status: AC | PRN
  Administered 2017-04-17: 1 mL

## 2017-04-17 MED ORDER — LIDOCAINE HCL (PF) 1 % IJ SOLN
0.5000 mL | INTRAMUSCULAR | Status: AC | PRN
  Administered 2017-04-17: .5 mL

## 2017-04-17 MED ORDER — DICLOFENAC SODIUM 1 % TD GEL: TRANSDERMAL | 3 refills | Status: DC

## 2017-04-18 ENCOUNTER — Telehealth: Payer: Self-pay

## 2017-04-18 NOTE — Telephone Encounter (Signed)
Received a confirmation from [employer] through cover my meds regarding a prior authorization approval through 11/04/2017.   Reference number: [id number] Phone number: [phone]  Left message for patient to call back to update her. Returned the fax to [employer] showing the medication was approved.   Will send document to scan center.  Romin Divita, [place], CPhT  12:05 PM

## 2017-04-18 NOTE — Telephone Encounter (Signed)
A prior authorization for voltaren gel was submitted through cover my meds. Will update once we receive a response.   Sumire Halbleib, [place]lewellyn [place], CPhT 11:01 AM

## 2017-04-18 NOTE — Telephone Encounter (Signed)
Patient returned call. Updated her on the authorization. Informed patient that she should not have any problems getting it at her pharmacy. Patient voiced understanding and denied any questions at this time.  Jakylah Bassinger, [place], CPhT 3:53 PM

## 2017-06-05 HISTORY — PX: ROTATOR CUFF REPAIR: SHX139

## 2017-12-20 ENCOUNTER — Ambulatory Visit: Payer: Medicare Other | Admitting: Internal Medicine

## 2017-12-20 ENCOUNTER — Encounter: Payer: Self-pay | Admitting: Internal Medicine

## 2017-12-20 ENCOUNTER — Ambulatory Visit (INDEPENDENT_AMBULATORY_CARE_PROVIDER_SITE_OTHER)
Admission: RE | Admit: 2017-12-20 | Discharge: 2017-12-20 | Disposition: A | Discharge status: Home / Self Care | Payer: Medicare Other | Source: Ambulatory Visit | Attending: Internal Medicine | Admitting: Internal Medicine

## 2017-12-20 VITALS — BP 128/82 | HR 77 | Ht 64.0 in | Wt 199.2 lb

## 2017-12-20 DIAGNOSIS — R053 Chronic cough: Secondary | ICD-10-CM

## 2017-12-20 DIAGNOSIS — R05 Cough: Secondary | ICD-10-CM

## 2017-12-20 DIAGNOSIS — R059 Cough, unspecified: Secondary | ICD-10-CM

## 2017-12-20 MED ORDER — ESOMEPRAZOLE MAGNESIUM 40 MG PO CPDR: 40.0000 mg | DELAYED_RELEASE_CAPSULE | Freq: Two times a day (BID) | ORAL | 2 refills | Status: DC

## 2017-12-20 MED ORDER — PREDNISONE 10 MG PO TABS: ORAL_TABLET | ORAL | 0 refills | Status: DC

## 2017-12-20 NOTE — Patient Instructions (Addendum)
Change nexium to 40 mg Take 30- 60 min before your first and last meals of the day   For drainage / throat tickle try take CHLORPHENIRAMINE  4 mg - take one every 4 hours as needed - available over the counter- may cause drowsiness so start with just a dose one or two x one  hour before bedtime)    and see how you tolerate it before trying in daytime just used as needed   GERD (REFLUX)  is an extremely common cause of respiratory symptoms just like yours , many times with no obvious heartburn at all.    It can be treated with medication, but also with lifestyle changes including elevation of the head of your bed (ideally with 6 inch  bed blocks),  Smoking cessation, avoidance of late meals, excessive alcohol, and avoid fatty foods, chocolate, peppermint, colas, red wine, and acidic juices such as orange juice.  NO MINT OR MENTHOL PRODUCTS SO NO COUGH DROPS   USE SUGARLESS CANDY INSTEAD (Jolley ranchers or Stover's or Life Savers) or even ice chips will also do - the key is to swallow to prevent all throat clearing. NO OIL BASED VITAMINS - use powdered substitutes.  Please see patient coordinator before you leave today  to schedule sinus ct  Please remember to go to the  x-ray department downstairs in the basement  for your tests - we will call you with the results when they are available.   If not improving >>> Prednisone 10 mg take  4 each am x 2 days,   2 each am x 2 days,  1 each am x 2 days and stop        Please schedule a follow up office visit in 6 weeks, sooner if needed  with all medications /inhalers/ solutions in hand so we can verify exactly what you are taking. This includes all medications from all doctors and over the counters - needs feno on return

## 2017-12-20 NOTE — Progress Notes (Signed)
Subjective:     Patient ID: Susan Carpenter, female   DOB: May 18, 1946,   MRN: [id number]  HPI   86 yowf never smoker with "allergies all her life"manifested by ? HA/some runny nose and pos w/u by Dr Irena Cords around 2014 eval Pos to trees/ pollen/ mold/ dust and shots x 2 years but  Had severe skin reactions to shots so they were stopped with no  Difference insymptoms per pt on vs off the shots  and varies zyrtec vs clariton and occ benadryl to control the symptoms  Then developed  insidious onset chronic cough x around 2016 so referred to pulmonary clinic 12/20/2017 by Dr Zachery Dauer  12/20/2017 1st [facility] office visit/ Susan Carpenter   Chief Complaint  Patient presents with  . Pulmonary Consult    Referred by Dr. Juluis Rainier. Pt c/o cough for 3 yrs. She states it's usually worse in the Winter, but "it's bad in the summer too". She states that the cough starts in the evening and will occ wake her up in the night. She has quite a bit of mucus in the mornings- unsure of color.  She also has occ chest tightness and the urge to clear her throat often.   onset was insidious and pattern variable = it can resolve for a few days at most then recurs and sometimes coughs up as much as a sev tsp esp in am's  Kouffman Reflux v Neurogenic Cough Differentiator Reflux Comments  Do you awaken from a sound sleep coughing violently?                            With trouble breathing? Not violently but sporadic   Do you have choking episodes when you cannot  Get enough air, gasping for air ?              no   Do you usually cough when you lie down into  The bed, or when you just lie down to rest ?                          Sporadic/ uses cough drops   Do you usually cough after meals or eating?         Yes   Do you cough when (or after) you bend over?    Yes   GERD SCORE     Kouffman Reflux v Neurogenic Cough Differentiator Neurogenic   Do you more-or-less cough all day long? sporadic   Does change of  temperature make you cough? no   Does laughing or chuckling cause you to cough? If lots of laugh   Do fumes (perfume, automobile fumes, burned  Toast, etc.,) cause you to cough ?      no   Does speaking, singing, or talking on the phone cause you to cough   ?               No    Neurogenic/Airway score      On nexium 20 mg 30 min before bfast and on multiple high dose oil based supplements  Cough to vomit it past  gen chest discomfort only during coughing fits  On plaquenil for alopecia   Not limited by breathing from desired activities    No obvious other patterns in  day to day or daytime variability or assoc  purulent sputum or mucus plugs or hemoptysis or  chest tightness, subjective wheeze or overt  hb symptoms. No unusual exposure hx or h/o childhood pna/ asthma or knowledge of premature birth.  Also denies any obvious fluctuation of symptoms with weather or environmental changes or other aggravating or alleviating factors except as outlined above   Current Allergies, Complete Past Medical History, Past Surgical History, Family History, and Social History were reviewed in [employer] record.  ROS  The following are not active complaints unless bolded Hoarseness, sore throat, dysphagia, dental problems, itching, sneezing,  nasal congestion or discharge of excess mucus or purulent secretions, ear ache,   fever, chills, sweats, unintended wt loss or wt gain, classically pleuritic or exertional cp,  orthopnea pnd or leg swelling, presyncope, palpitations, abdominal pain, anorexia, nausea, vomiting, diarrhea  or change in bowel habits or change in bladder habits, change in stools or change in urine, dysuria, hematuria,  rash, arthralgias, visual complaints, headache, numbness, weakness or ataxia or problems with walking or coordination,  change in mood/affect or memory.        Current Meds  Medication Sig  . cycloSPORINE (RESTASIS) 0.05 % ophthalmic emulsion Apply to  eye.  . diclofenac sodium (VOLTAREN) 1 % GEL Voltaren Gel 3 grams to 3 large joints upto TID 3 TUBES with 3 refills  . EPIPEN 2-PAK 0.3 MG/0.3ML SOAJ injection use as directed for INJECTION  . hydroxychloroquine (PLAQUENIL) 200 MG tablet Take 200 mg by mouth 2 (two) times daily.   . naproxen sodium (ANAPROX) 220 MG tablet Take by mouth.  . sertraline (ZOLOFT) 50 MG tablet   . TURMERIC PO Herbal Name: Tumeric  . valACYclovir (VALTREX) 1000 MG tablet   . [  cetirizine (ZYRTEC) 10 MG tablet Take 10 mg by mouth daily.    . [  diphenhydrAMINE (BENADRYL) 25 MG tablet Take 25 mg by mouth every 8 (eight) hours as needed for allergies.   . [ ]  esomeprazole (NEXIUM) 20 MG capsule Take 20 mg by mouth daily at 12 noon.  .   Flaxseed Oil OIL Use 1 capsule once daily.  .   Loratadine 10 MG CAPS Take 1 capsule by mouth as needed (bad allergy day). seasonal  . [  Omega-3 Fatty Acids (FISH OIL PO) Take by mouth.     Review of Systems     Objective:   Physical Exam amb wf nad  Wt Readings from Last 3 Encounters:  12/20/17 199 lb 3.2 oz (90.4 kg)  04/17/17 201 lb (91.2 kg)  01/15/17 201 lb (91.2 kg)     Vital signs reviewed - Note on arrival 02 sats  99% on RA    HEENT: nl dentition, turbinates bilaterally, and oropharynx. Nl external ear canals without cough reflex   NECK :  without JVD/Nodes/TM/ nl carotid upstrokes bilaterally   LUNGS: no acc muscle use,  Nl contour chest which is clear to A and P bilaterally without cough on insp or exp maneuvers   CV:  RRR  no s3 or murmur or increase in P2, and no edema   ABD:  soft and nontender with nl inspiratory excursion in the supine position. No bruits or organomegaly appreciated, bowel sounds nl  MS:  Nl gait/ ext warm without deformities, calf tenderness, cyanosis or clubbing No obvious joint restrictions   SKIN: warm and dry without lesions    NEURO:  alert, approp, nl sensorium with  no motor or cerebellar deficits apparent.       CXR PA and Lateral:   12/20/2017 :  I personally reviewed images and agree with radiology impression as follows:   No active cardiopulmonary disease.      Assessment:

## 2017-12-20 NOTE — Progress Notes (Signed)
Spoke with pt and notified of results per Dr. Wert. Pt verbalized understanding and denied any questions. 

## 2017-12-21 ENCOUNTER — Encounter: Payer: Self-pay | Admitting: Internal Medicine

## 2017-12-21 NOTE — Assessment & Plan Note (Signed)
Spirometry 12/20/2017  wnl x for effort dep portion of f/v loop  - FENO 12/20/2017  =   Machine malfunction - max gerd rx plus 1st gen H1 blockers per guidelines  12/20/2017 >>>   The most common causes of chronic cough in immunocompetent adults include the following: upper airway cough syndrome (UACS), previously referred to as postnasal drip syndrome (PNDS), which is caused by variety of rhinosinus conditions; (2) asthma; (3) GERD; (4) chronic bronchitis from cigarette smoking or other inhaled environmental irritants; (5) nonasthmatic eosinophilic bronchitis; and (6) bronchiectasis.   These conditions, singly or in combination, have accounted for up to 94% of the causes of chronic cough in prospective studies.   Other conditions have constituted no >6% of the causes in prospective studies These have included bronchogenic carcinoma, chronic interstitial pneumonia, sarcoidosis, left ventricular failure, ACEI-induced cough, and aspiration from a condition associated with pharyngeal dysfunction.    Chronic cough is often simultaneously caused by more than one condition. A single cause has been found from 38 to 82% of the time, multiple causes from 18 to 62%. Multiply caused cough has been the result of three diseases up to 42% of the time.       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   These are the same pts (now being labeled as having "irritable larynx syndrome" by some  cough centers) who not infrequently have a history of having failed to tolerate ace inhibitors,  dry powder inhalers or biphosphonates or report having atypical/extraesophageal reflux symptoms that don't respond to standard doses of PPI  and are easily confused as having aecopd or asthma flares by even experienced allergists/ pulmonologists (myself included).    rec max gerd rx and 1st gen H1 blockers per guidelines  Then regroup in 6 weeks with all meds in hand using a trust but verify approach to confirm accurate Medication  Reconciliation The principal here is that until we are certain that the  patients are doing what we've asked, it makes no sense to ask them to do more.   If not improving can use one 6 day course of prednisone on a trial basis    Reviewed with pt: The standardized cough guidelines published in Chest by [employer] in 2006 are still the best available and consist of a multiple step process (up to 12!) , not a single office visit,  and are intended  to address this problem logically,  with an alogrithm dependent on response to empiric treatment at  each progressive step  to determine a specific diagnosis with  minimal addtional testing needed. Therefore if adherence is an issue or can't be accurately verified,  it's very unlikely the standard evaluation and treatment will be successful here.    Furthermore, response to therapy (other than acute cough suppression, which should only be used short term with avoidance of narcotic containing cough syrups if possible), can be a gradual process for which the patient  is not likely to  perceive immediate benefit.  Unlike going to an eye doctor where the best perscription is almost always the first one and is immediately effective, this is almost never the case in the management of chronic cough syndromes. Therefore the patient needs to commit up front to consistently adhere to recommendations  for up to 6 weeks of therapy directed at the  likely underlying problem(s) before the response can be reasonably evaluated.     Total time devoted to counseling  > 50 % of initial 60 min office visit:  review case with pt/ discussion of options/alternatives/ personally creating written customized instructions  in presence of pt  then going over those specific  Instructions directly with the pt including how to use all of the meds but in particular covering each new medication in detail and the difference between the maintenance= "automatic" meds and the prns using an action plan format for the latter (If this problem/symptom => do that organization reading Left to right).  Please see AVS from this visit for a full list of these instructions which I personally wrote for this pt and  are unique to this visit.

## 2018-01-01 ENCOUNTER — Ambulatory Visit (INDEPENDENT_AMBULATORY_CARE_PROVIDER_SITE_OTHER)
Admission: RE | Admit: 2018-01-01 | Discharge: 2018-01-01 | Disposition: A | Discharge status: Home / Self Care | Payer: Medicare Other | Source: Ambulatory Visit | Attending: Internal Medicine | Admitting: Internal Medicine

## 2018-01-01 DIAGNOSIS — R05 Cough: Secondary | ICD-10-CM | POA: Diagnosis not present

## 2018-01-01 DIAGNOSIS — R053 Chronic cough: Secondary | ICD-10-CM

## 2018-01-01 DIAGNOSIS — R059 Cough, unspecified: Secondary | ICD-10-CM

## 2018-01-01 NOTE — Progress Notes (Signed)
Spoke with pt and notified of results per Dr. Wert. Pt verbalized understanding and denied any questions. 

## 2018-01-30 ENCOUNTER — Encounter: Payer: Self-pay | Admitting: Internal Medicine

## 2018-01-30 ENCOUNTER — Ambulatory Visit: Payer: Medicare Other | Admitting: Internal Medicine

## 2018-01-30 VITALS — BP 134/80 | HR 70 | Ht 64.0 in | Wt 193.6 lb

## 2018-01-30 DIAGNOSIS — R05 Cough: Secondary | ICD-10-CM | POA: Diagnosis not present

## 2018-01-30 DIAGNOSIS — R053 Chronic cough: Secondary | ICD-10-CM

## 2018-01-30 DIAGNOSIS — J45991 Cough variant asthma: Secondary | ICD-10-CM | POA: Diagnosis not present

## 2018-01-30 LAB — NITRIC OXIDE: Nitric Oxide: 13

## 2018-01-30 MED ORDER — BUDESONIDE-FORMOTEROL FUMARATE 80-4.5 MCG/ACT IN AERO: 2.0000 | INHALATION_SPRAY | Freq: Two times a day (BID) | RESPIRATORY_TRACT | 0 refills | Status: DC

## 2018-01-30 MED ORDER — BUDESONIDE-FORMOTEROL FUMARATE 80-4.5 MCG/ACT IN AERO: 2.0000 | INHALATION_SPRAY | Freq: Two times a day (BID) | RESPIRATORY_TRACT | 11 refills | Status: DC

## 2018-01-30 NOTE — Progress Notes (Signed)
Subjective:     Patient ID: Susan Carpenter, female   DOB: January 10, 1946,   MRN: [id number]     Brief patient profile:  58 yowf never smoker with "allergies all her life"manifested by ? HA/some runny nose and pos w/u by Dr Irena Cords around 2014 eval Pos to trees/ pollen/ mold/ dust and shots x 2 years but  Had severe skin reactions to shots so they were stopped with no  Difference in symptoms per pt on vs off the shots  and varies zyrtec vs clariton and occ benadryl to control the symptoms  Then developed  insidious onset chronic cough x around 2016 so referred to pulmonary clinic 12/20/2017 by Dr Zachery Dauer   History of Present Illness  12/20/2017 1st [facility] office visit/ Manan Olmo   Chief Complaint  Patient presents with  . Pulmonary Consult    Referred by Dr. Juluis Rainier. Pt c/o cough for 3 yrs. She states it's usually worse in the Winter, but "it's bad in the summer too". She states that the cough starts in the evening and will occ wake her up in the night. She has quite a bit of mucus in the mornings- unsure of color.  She also has occ chest tightness and the urge to clear her throat often.   onset was insidious and pattern variable = it can resolve for a few days at most then recurs and sometimes coughs up as much as a sev tsp esp in am's  Kouffman Reflux v Neurogenic Cough Differentiator Reflux Comments  Do you awaken from a sound sleep coughing violently?                            With trouble breathing? Not violently but sporadic   Do you have choking episodes when you cannot  Get enough air, gasping for air ?              no   Do you usually cough when you lie down into  The bed, or when you just lie down to rest ?                          Sporadic/ uses cough drops   Do you usually cough after meals or eating?         Yes   Do you cough when (or after) you bend over?    Yes   GERD SCORE     Kouffman Reflux v Neurogenic Cough Differentiator Neurogenic   Do you more-or-less  cough all day long? sporadic   Does change of temperature make you cough? no   Does laughing or chuckling cause you to cough? If lots of laugh   Do fumes (perfume, automobile fumes, burned  Toast, etc.,) cause you to cough ?      no   Does speaking, singing, or talking on the phone cause you to cough   ?               No    Neurogenic/Airway score      On nexium 20 mg 30 min before bfast and on multiple high dose oil based supplements  Cough to vomit it past  gen chest discomfort only during coughing fits  On plaquenil for alopecia  Not limited by breathing from desired activities  rec Change nexium to 40 mg Take 30- 60 min before your first and last meals of  the day  For drainage / throat tickle try take CHLORPHENIRAMINE  4 mg - take one every 4 hours as needed - GERD   Please see patient coordinator before you leave today  to schedule sinus ct If not improving >>> Prednisone 10 mg take  4 each am x 2 days,   2 each am x 2 days,  1 each am x 2 days and stop  Please schedule a follow up office visit in 6 weeks, sooner if needed  with all medications /inhalers/ solutions in hand so we can verify exactly what you are taking. This includes all medications from all doctors and over the counters - needs feno on return.    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 Follow-up    Cough has improved some. She occ will produce some sputum but she is unsure of color.  She has been having some HA and fatigue that she relates to her allergies.   Dyspnea:  Not limited by sob  Cough: supper time  And after  Sleep: not waking up  SABA use:  None    No obvious day to day or daytime variability or assoc excess/ purulent sputum or mucus plugs or hemoptysis or cp or chest tightness, subjective wheeze or overt sinus or hb symptoms. No unusual exposure hx or h/o childhood pna/ asthma or  knowledge of premature birth.  Sleeping ok flat without nocturnal  or early am exacerbation  of respiratory  c/o's or need for noct saba. Also denies any obvious fluctuation of symptoms with weather or environmental changes or other aggravating or alleviating factors except as outlined above   Current Allergies, Complete Past Medical History, Past Surgical History, Family History, and Social History were reviewed in [facility] electronic medical record.  ROS  The following are not active complaints unless bolded Hoarseness, sore throat, dysphagia, dental problems, itching, sneezing,  nasal congestion or discharge of excess mucus or purulent secretions, ear ache,   fever, chills, sweats, unintended wt loss or wt gain, classically pleuritic or exertional cp,  orthopnea pnd or leg swelling, presyncope, palpitations, abdominal pain, anorexia, nausea, vomiting, diarrhea  or change in bowel habits or change in bladder habits, change in stools or change in urine, dysuria, hematuria,  rash, arthralgias, visual complaints, headache, numbness, weakness or ataxia or problems with walking or coordination,  change in mood/affect or memory.        Current Meds  Medication Sig  . cycloSPORINE (RESTASIS) 0.05 % ophthalmic emulsion Apply to eye.  . diclofenac sodium (VOLTAREN) 1 % GEL Voltaren Gel 3 grams to 3 large joints upto TID 3 TUBES with 3 refills  . EPIPEN 2-PAK 0.3 MG/0.3ML SOAJ injection use as directed for INJECTION  . esomeprazole (NEXIUM) 40 MG capsule Take 1 capsule (40 mg total) by mouth 2 (two) times daily before a meal.  . naproxen sodium (ANAPROX) 220 MG tablet Take by mouth.  . sertraline (ZOLOFT) 50 MG tablet   . TURMERIC PO Herbal Name: Tumeric  . valACYclovir (VALTREX) 1000 MG tablet Take 500 mg by mouth as needed.             Objective:   Physical Exam  amb wf nad    01/30/2018        193   12/20/17 199 lb 3.2 oz (90.4 kg)  04/17/17 201 lb (91.2 kg)  01/15/17 201 lb (91.2  kg)      Vital signs reviewed - Note on arrival 02 sats  97% on RA    HEENT: nl dentition, and oropharynx. Nl external ear canals without cough reflex - mild bilateral non-specific turbinate edema     NECK :  without JVD/Nodes/TM/ nl carotid upstrokes bilaterally   LUNGS: no acc muscle use,  Nl contour chest which is clear to A and P bilaterally without cough on insp or exp maneuvers   CV:  RRR  no s3 or murmur or increase in P2, and no edema   ABD:  soft and nontender with nl inspiratory excursion in the supine position. No bruits or organomegaly appreciated, bowel sounds nl  MS:  Nl gait/ ext warm without deformities, calf tenderness, cyanosis or clubbing No obvious joint restrictions   SKIN: warm and dry without lesions    NEURO:  alert, approp, nl sensorium with  no motor or cerebellar deficits apparent.                 Assessment:

## 2018-01-30 NOTE — Patient Instructions (Addendum)
Go ahead and take the prednisone  symbicort 80 Take 2 puffs first thing in am and then another 2 puffs about 12 hours later.   Work on inhaler technique:  relax and gently blow all the way out then take a nice smooth deep breath back in, triggering the inhaler at same time you start breathing in.  Hold for up to 5 seconds if you can. Blow out thru nose. Rinse and gargle with water when done     Stop the clariton and zyrtec and try  CHLORPHENIRAMINE  4 mg - take one - two every 4 hours as needed - available over the counter- may cause drowsiness so start with just a bedtime dose or two and see how you tolerate it before trying in daytime    Please schedule a follow up office visit in 4 weeks, sooner if needed

## 2018-02-02 ENCOUNTER — Encounter: Payer: Self-pay | Admitting: Internal Medicine

## 2018-02-02 DIAGNOSIS — J45991 Cough variant asthma: Secondary | ICD-10-CM | POA: Insufficient documentation

## 2018-02-02 NOTE — Assessment & Plan Note (Signed)
01/30/2018  After extensive coaching inhaler device  effectiveness =    75% try symb 80 2bid sample

## 2018-02-02 NOTE — Assessment & Plan Note (Signed)
singulair failed 2017-18 Spirometry 12/20/2017  wnl x for effort dep portion of f/v loop  - FENO 12/20/2017  =   Machine malfunction - max gerd rx plus 1st gen H1 blockers per guidelines  12/20/2017 >>>did not use latter - CT sinus 01/01/2018  Ok   - FENO 01/30/2018  =   13  - 01/30/2018  After extensive coaching inhaler device  effectiveness =    75% try symbicort 80 2bid   Lack of cough resolution on a verified empirical regimen (which we have not yet really accomplished)  could mean an alternative diagnosis (cough vairant asthma) , persistence of the disease state (eg sinusitis- ruled out already-  or bronchiectasis, less likely s am flares reported) , or inadequacy of currently available therapy (eg no medical rx available for non-acid gerd)   rec next add symbicort 80 2bid for possible cough variant asthma and short course prednisone plus 1st gen H1 blockers per guidelines  And regroup in 4 weeks  I had an extended discussion with the patient reviewing all relevant studies completed to date and  lasting 15 to 20 minutes of a 25 minute visit    Each maintenance medication was reviewed in detail including most importantly the difference between maintenance and prns and under what circumstances the prns are to be triggered using an action plan format that is not reflected in the computer generated alphabetically organized AVS.    Please see AVS for specific instructions unique to this visit that I personally wrote and verbalized to the the pt in detail and then reviewed with pt  by my nurse highlighting any  changes in therapy recommended at today's visit to their plan of care.

## 2018-02-27 ENCOUNTER — Ambulatory Visit: Payer: Medicare Other | Admitting: Internal Medicine

## 2018-02-27 ENCOUNTER — Encounter: Payer: Self-pay | Admitting: Internal Medicine

## 2018-02-27 ENCOUNTER — Other Ambulatory Visit (INDEPENDENT_AMBULATORY_CARE_PROVIDER_SITE_OTHER): Payer: Medicare Other

## 2018-02-27 VITALS — BP 124/70 | HR 65 | Ht 64.0 in | Wt 190.0 lb

## 2018-02-27 DIAGNOSIS — R053 Chronic cough: Secondary | ICD-10-CM

## 2018-02-27 DIAGNOSIS — J45991 Cough variant asthma: Secondary | ICD-10-CM | POA: Diagnosis not present

## 2018-02-27 DIAGNOSIS — R05 Cough: Secondary | ICD-10-CM

## 2018-02-27 LAB — CBC WITH DIFFERENTIAL/PLATELET
BASOS PCT: 1.1 % (ref 0.0–3.0)
Basophils Absolute: 0.1 10*3/uL (ref 0.0–0.1)
EOS PCT: 4.8 % (ref 0.0–5.0)
Eosinophils Absolute: 0.3 10*3/uL (ref 0.0–0.7)
HEMATOCRIT: 40.7 % (ref 36.0–46.0)
HEMOGLOBIN: 13.7 g/dL (ref 12.0–15.0)
Lymphocytes Relative: 26.6 % (ref 12.0–46.0)
Lymphs Abs: 1.7 10*3/uL (ref 0.7–4.0)
MCHC: 33.7 g/dL (ref 30.0–36.0)
MCV: 89.4 fl (ref 78.0–100.0)
MONO ABS: 0.5 10*3/uL (ref 0.1–1.0)
Monocytes Relative: 7.5 % (ref 3.0–12.0)
Neutro Abs: 3.9 10*3/uL (ref 1.4–7.7)
Neutrophils Relative %: 60 % (ref 43.0–77.0)
Platelets: 227 10*3/uL (ref 150.0–400.0)
RBC: 4.55 Mil/uL (ref 3.87–5.11)
RDW: 13.9 % (ref 11.5–15.5)
WBC: 6.5 10*3/uL (ref 4.0–10.5)

## 2018-02-27 NOTE — Progress Notes (Signed)
Subjective:     Patient ID: Susan Carpenter, female   DOB: January 10, 1946,   MRN: [id number]     Brief patient profile:  58 yowf never smoker with "allergies all her life"manifested by ? HA/some runny nose and pos w/u by Dr Irena Cords around 2014 eval Pos to trees/ pollen/ mold/ dust and shots x 2 years but  Had severe skin reactions to shots so they were stopped with no  Difference in symptoms per pt on vs off the shots  and varies zyrtec vs clariton and occ benadryl to control the symptoms  Then developed  insidious onset chronic cough x around 2016 so referred to pulmonary clinic 12/20/2017 by Dr Zachery Dauer   History of Present Illness  12/20/2017 1st [facility] office visit/ Dhalia Zingaro   Chief Complaint  Patient presents with  . Pulmonary Consult    Referred by Dr. Juluis Rainier. Pt c/o cough for 3 yrs. She states it's usually worse in the Winter, but "it's bad in the summer too". She states that the cough starts in the evening and will occ wake her up in the night. She has quite a bit of mucus in the mornings- unsure of color.  She also has occ chest tightness and the urge to clear her throat often.   onset was insidious and pattern variable = it can resolve for a few days at most then recurs and sometimes coughs up as much as a sev tsp esp in am's  Kouffman Reflux v Neurogenic Cough Differentiator Reflux Comments  Do you awaken from a sound sleep coughing violently?                            With trouble breathing? Not violently but sporadic   Do you have choking episodes when you cannot  Get enough air, gasping for air ?              no   Do you usually cough when you lie down into  The bed, or when you just lie down to rest ?                          Sporadic/ uses cough drops   Do you usually cough after meals or eating?         Yes   Do you cough when (or after) you bend over?    Yes   GERD SCORE     Kouffman Reflux v Neurogenic Cough Differentiator Neurogenic   Do you more-or-less  cough all day long? sporadic   Does change of temperature make you cough? no   Does laughing or chuckling cause you to cough? If lots of laugh   Do fumes (perfume, automobile fumes, burned  Toast, etc.,) cause you to cough ?      no   Does speaking, singing, or talking on the phone cause you to cough   ?               No    Neurogenic/Airway score      On nexium 20 mg 30 min before bfast and on multiple high dose oil based supplements  Cough to vomit it past  gen chest discomfort only during coughing fits  On plaquenil for alopecia  Not limited by breathing from desired activities  rec Change nexium to 40 mg Take 30- 60 min before your first and last meals of  the day  For drainage / throat tickle try take CHLORPHENIRAMINE  4 mg - take one every 4 hours as needed - GERD   Please see patient coordinator before you leave today  to schedule sinus ct If not improving >>> Prednisone 10 mg take  4 each am x 2 days,   2 each am x 2 days,  1 each am x 2 days and stop  Please schedule a follow up office visit in 6 weeks, sooner if needed  with all medications /inhalers/ solutions in hand so we can verify exactly what you are taking. This includes all medications from all doctors and over the counters - needs feno on return.    01/30/2018  f/u ov/Alix Lahmann re:  uacs vs cough variant asthma / poor cough control since 2016/ did not bring all meds Chief Complaint  Patient presents with  . Follow-up    Cough has improved some. She occ will produce some sputum but she is unsure of color.  She has been having some HA and fatigue that she relates to her allergies.   Dyspnea:  Not limited by sob  Cough: supper time  And after  Sleep: not waking up  SABA use:  None  rec Go ahead and take the prednisone Symbicort 80 Take 2 puffs first thing in am and then another 2 puffs about 12 hours later.  Work on inhaler technique:    Stop the clariton and zyrtec and try  CHLORPHENIRAMINE  4 mg - take one - two every  4 hours as needed - available over the counter- may cause drowsiness so start with just a bedtime dose or two and see how you tolerate it before trying in daytime      02/27/2018  f/u ov/Samika Vetsch re: uacs vs cough variant asthma/ poor control sinc 2016/brough meds but not clear she ever tried h1 as rec last ov  Chief Complaint  Patient presents with  . Follow-up    She tried the symbicort for 1 wk and her cough got worse so she stopped. She states she went to the beach recently and she did not cough as much.   Dyspnea:  Felt fine during the walk x 27 min flat while at the beach    Cough: after supper and at bedtime min mucoid sputum  symbicort made the cough worse  Prednisone may have helped    No obvious day to day or daytime variability or assoc excess/ purulent sputum or mucus plugs or hemoptysis or cp or chest tightness, subjective wheeze or overt sinus or hb symptoms. No unusual exposure hx or h/o childhood pna/ asthma or knowledge of premature birth.  Sleeping  Ok  without nocturnal  or early am exacerbation  of respiratory  c/o's or need for noct saba. Also denies any obvious fluctuation of symptoms with weather or environmental changes or other aggravating or alleviating factors except as outlined above   Current Allergies, Complete Past Medical History, Past Surgical History, Family History, and Social History were reviewed in [facility] record.  ROS  The following are not active complaints unless bolded Hoarseness, sore throat, dysphagia, dental problems, itching, sneezing,  nasal congestion or discharge of excess mucus or purulent secretions, ear ache,   fever, chills, sweats, unintended wt loss or wt gain, classically pleuritic or exertional cp,  orthopnea pnd or arm/hand swelling  or leg swelling, presyncope, palpitations, abdominal pain, anorexia, nausea, vomiting, diarrhea  or change in bowel habits or change in  bladder habits, change in stools or change in  urine, dysuria, hematuria,  rash, arthralgias, visual complaints, headache, numbness, weakness or ataxia or problems with walking or coordination,  change in mood or  memory.        Current Meds  Medication Sig  . cycloSPORINE (RESTASIS) 0.05 % ophthalmic emulsion Apply to eye.  . diclofenac sodium (VOLTAREN) 1 % GEL Voltaren Gel 3 grams to 3 large joints upto TID 3 TUBES with 3 refills  . EPIPEN 2-PAK 0.3 MG/0.3ML SOAJ injection use as directed for INJECTION  . esomeprazole (NEXIUM) 40 MG capsule Take 1 capsule (40 mg total) by mouth 2 (two) times daily before a meal.  . finasteride (PROSCAR) 5 MG tablet Take 2.5 mg by mouth daily.  . naproxen sodium (ANAPROX) 220 MG tablet Take by mouth.  . sertraline (ZOLOFT) 50 MG tablet   . TURMERIC PO Herbal Name: Tumeric  . valACYclovir (VALTREX) 1000 MG tablet Take 500 mg by mouth as needed.                 Objective:   Physical Exam  amb wf nad     02/27/2018        190  01/30/2018        193   12/20/17 199 lb 3.2 oz (90.4 kg)  04/17/17 201 lb (91.2 kg)  01/15/17 201 lb (91.2 kg)       Vital signs reviewed - Note on arrival 02 sats  96% on RA         HEENT: nl dentition, turbinates bilaterally, and oropharynx. Nl external ear canals without cough reflex   NECK :  without JVD/Nodes/TM/ nl carotid upstrokes bilaterally   LUNGS: no acc muscle use,  Nl contour chest  Pseudowheeze/ cough resolves with plm without cough on insp or exp maneuvers   CV:  RRR  no s3 or murmur or increase in P2, and no edema   ABD:  soft and nontender with nl inspiratory excursion in the supine position. No bruits or organomegaly appreciated, bowel sounds nl  MS:  Nl gait/ ext warm without deformities, calf tenderness, cyanosis or clubbing No obvious joint restrictions   SKIN: warm and dry without lesions    NEURO:  alert, approp, nl sensorium with  no motor or cerebellar deficits apparent.         Labs ordered 02/27/2018  Allergy profile            Assessment:

## 2018-02-27 NOTE — Patient Instructions (Addendum)
Please remember to go to the lab department downstairs in the basement  for your tests - we will call you with the results when they are available.  GERD (REFLUX)  is an extremely common cause of respiratory symptoms just like yours , many times with no obvious heartburn at all.    It can be treated with medication, but also with lifestyle changes including elevation of the head of your bed (ideally with 6 inch  bed blocks),  Smoking cessation, avoidance of late meals, excessive alcohol, and avoid fatty foods, chocolate, peppermint, colas, red wine, and acidic juices such as orange juice.  NO MINT OR MENTHOL PRODUCTS SO NO COUGH DROPS   USE SUGARLESS CANDY INSTEAD (Jolley ranchers or Stover's or Life Savers) or even ice chips will also do - the key is to swallow to prevent all throat clearing. NO OIL BASED VITAMINS - use powdered substitutes.  For drainage / throat tickle try take CHLORPHENIRAMINE  4 mg - take one every 4 hours as needed - available over the counter- may cause drowsiness so start with just a bedtime dose or two and see how you tolerate it before trying in daytime  Up to 2 every 4 hours  If cough or breathing are not better to your satisfaction, the next step is a methacholine challenge - call to schedule

## 2018-02-28 LAB — RESPIRATORY ALLERGY PROFILE REGION II ~~LOC~~
Allergen, A. alternata, m6: <0.1 kU/L
Allergen, Cedar tree, t12: <0.1 kU/L
Allergen, D pternoyssinus,d7: 1.41 kU/L — ABNORMAL HIGH
Allergen, Mouse Urine Protein, e78: <0.1 kU/L
Allergen, Mulberry, t76: <0.1 kU/L
Allergen, Oak,t7: <0.1 kU/L
Allergen, P. notatum, m1: <0.1 kU/L
Aspergillus fumigatus, m3: <0.1 kU/L
CLASS: 0
CLASS: 0
CLASS: 0
CLASS: 0
CLASS: 0
CLASS: 0
CLASS: 0
CLASS: 0
CLASS: 0
CLASS: 0
CLASS: 0
CLASS: 0
COMMON RAGWEED (SHORT) (W1) IGE: 0.15 kU/L — AB
Cat Dander: <0.1 kU/L
Class: 0
Class: 0
Class: 0
Class: 0
Class: 0
Class: 0
Class: 0
Class: 0
Class: 0
Class: 0
Class: 2
Class: 2
D. farinae: 1.58 kU/L — ABNORMAL HIGH
IgE (Immunoglobulin E), Serum: 74 kU/L (ref <=114)
Johnson Grass: <0.1 kU/L
Rough Pigweed  IgE: <0.1 kU/L
Timothy Grass: <0.1 kU/L

## 2018-02-28 LAB — INTERPRETATION:

## 2018-03-02 ENCOUNTER — Encounter: Payer: Self-pay | Admitting: Internal Medicine

## 2018-03-02 NOTE — Assessment & Plan Note (Signed)
01/30/2018  After extensive coaching inhaler device  effectiveness =    75% try symb 80 2bid sample> worse so stopped symbicort   She could still have asthma and uacs as they are not mutually exclusive but for now rec leave the ICS off and then proceed with MCT and if this is neg consider rx with gabapentin

## 2018-03-02 NOTE — Assessment & Plan Note (Signed)
singulair failed 2017-18 Spirometry 12/20/2017  wnl x for effort dep portion of f/v loop  - FENO 12/20/2017  =   Machine malfunction - max gerd rx plus 1st gen H1 blockers per guidelines  12/20/2017 >>>did not use latter - CT sinus 01/01/2018  Ok  - FENO 01/30/2018  =   13  - 01/30/2018  After extensive coaching inhaler device  effectiveness =    75% try symbicort 80 2bid > cough so stopped  - Allergy profile 02/27/2018 >  Eos 0.3 /  IgE 74  RAST Dust > ragweed   This pattern of cought that does worse on symbicort c/w Upper airway cough syndrome (previously labeled PNDS),  is so named because it's frequently impossible to sort out how much is  CR/sinusitis with freq throat clearing (which can be related to primary GERD)   vs  causing  secondary (" extra esophageal")  GERD from wide swings in gastric pressure that occur with throat clearing, often  promoting self use of mint and menthol lozenges that reduce the lower esophageal sphincter tone and exacerbate the problem further in a cyclical fashion.   These are the same pts (now being labeled as having "irritable larynx syndrome" by some cough centers) who not infrequently have a history of having failed to tolerate ace inhibitors,  dry powder inhalers or biphosphonates or report having atypical/extraesophageal reflux symptoms that don't respond to standard doses of PPI  and are easily confused as having aecopd or asthma flares by even experienced allergists/ pulmonologists (myself included).    rec add max 1st gen H1 blockers per guidelines  If tol and if not better > methacholine challenge is next step   I had an extended discussion with the patient reviewing all relevant studies completed to date and  lasting 15 to 20 minutes of a 25 minute visit    Each maintenance medication was reviewed in detail including most importantly the difference between maintenance and prns and under what circumstances the prns are to be triggered using an action plan  format that is not reflected in the computer generated alphabetically organized AVS.    Please see AVS for specific instructions unique to this visit that I personally wrote and verbalized to the the pt in detail and then reviewed with pt  by my nurse highlighting any  changes in therapy recommended at today's visit to their plan of care.

## 2018-03-03 ENCOUNTER — Other Ambulatory Visit: Payer: Self-pay | Admitting: Internal Medicine

## 2018-03-03 DIAGNOSIS — J45991 Cough variant asthma: Secondary | ICD-10-CM

## 2018-03-03 NOTE — Progress Notes (Signed)
Spoke with pt and notified of results per Dr. Wert. Pt verbalized understanding and denied any questions. 

## 2018-03-07 ENCOUNTER — Ambulatory Visit (HOSPITAL_COMMUNITY)
Admission: RE | Admit: 2018-03-07 | Discharge: 2018-03-07 | Disposition: A | Discharge status: Home / Self Care | Payer: Medicare Other | Source: Ambulatory Visit | Attending: Internal Medicine | Admitting: Internal Medicine

## 2018-03-07 DIAGNOSIS — J45991 Cough variant asthma: Secondary | ICD-10-CM

## 2018-03-07 LAB — PULMONARY FUNCTION TEST
FEF 25-75 Post: 3.59 L/sec
FEF 25-75 Pre: 3.67 L/sec
FEF2575-%CHANGE-POST: -2 %
FEF2575-%PRED-POST: 196 %
FEF2575-%PRED-PRE: 200 %
FEV1-%CHANGE-POST: 0 %
FEV1-%PRED-POST: 118 %
FEV1-%Pred-Pre: 117 %
FEV1-Post: 2.63 L
FEV1-Pre: 2.61 L
FEV1FVC-%Change-Post: 1 %
FEV1FVC-%PRED-PRE: 112 %
FEV6-%CHANGE-POST: -1 %
FEV6-%PRED-POST: 107 %
FEV6-%Pred-Pre: 108 %
FEV6-Post: 3.03 L
FEV6-Pre: 3.06 L
FEV6FVC-%Pred-Post: 105 %
FEV6FVC-%Pred-Pre: 105 %
FVC-%Change-Post: -1 %
FVC-%Pred-Post: 102 %
FVC-%Pred-Pre: 103 %
FVC-Post: 3.03 L
FVC-Pre: 3.06 L
POST FEV1/FVC RATIO: 87 %
Post FEV6/FVC ratio: 100 %
Pre FEV1/FVC ratio: 85 %
Pre FEV6/FVC Ratio: 100 %

## 2018-03-07 MED ORDER — METHACHOLINE 4 MG/ML NEB SOLN
2.0000 mL | Freq: Once | RESPIRATORY_TRACT | Status: AC
  Administered 2018-03-07: 8 mg via RESPIRATORY_TRACT
  Filled 2018-03-07: qty 2

## 2018-03-07 MED ORDER — SODIUM CHLORIDE 0.9 % IN NEBU
3.0000 mL | INHALATION_SOLUTION | Freq: Once | RESPIRATORY_TRACT | Status: AC
  Administered 2018-03-07: 3 mL via RESPIRATORY_TRACT
  Filled 2018-03-07: qty 3

## 2018-03-07 MED ORDER — METHACHOLINE 0.0625 MG/ML NEB SOLN
2.0000 mL | Freq: Once | RESPIRATORY_TRACT | Status: AC
  Administered 2018-03-07: 0.125 mg via RESPIRATORY_TRACT
  Filled 2018-03-07: qty 2

## 2018-03-07 MED ORDER — METHACHOLINE 0.25 MG/ML NEB SOLN
2.0000 mL | Freq: Once | RESPIRATORY_TRACT | Status: AC
  Administered 2018-03-07: 0.5 mg via RESPIRATORY_TRACT
  Filled 2018-03-07: qty 2

## 2018-03-07 MED ORDER — METHACHOLINE 16 MG/ML NEB SOLN
2.0000 mL | Freq: Once | RESPIRATORY_TRACT | Status: AC
  Administered 2018-03-07: 32 mg via RESPIRATORY_TRACT
  Filled 2018-03-07: qty 2

## 2018-03-07 MED ORDER — METHACHOLINE 1 MG/ML NEB SOLN
2.0000 mL | Freq: Once | RESPIRATORY_TRACT | Status: AC
  Administered 2018-03-07: 2 mg via RESPIRATORY_TRACT
  Filled 2018-03-07: qty 2

## 2018-03-07 MED ORDER — ALBUTEROL SULFATE (2.5 MG/3ML) 0.083% IN NEBU
2.5000 mg | INHALATION_SOLUTION | Freq: Once | RESPIRATORY_TRACT | Status: AC
  Administered 2018-03-07: 2.5 mg via RESPIRATORY_TRACT

## 2018-03-10 ENCOUNTER — Other Ambulatory Visit: Payer: Self-pay | Admitting: Internal Medicine

## 2018-03-10 NOTE — Progress Notes (Signed)
Spoke with pt and notified of results per Dr. Wert. 

## 2018-04-01 ENCOUNTER — Encounter: Payer: Self-pay | Admitting: Internal Medicine

## 2018-04-01 ENCOUNTER — Ambulatory Visit: Payer: Medicare Other | Admitting: Pulmonary Disease

## 2018-04-01 ENCOUNTER — Ambulatory Visit: Payer: Medicare Other | Admitting: Internal Medicine

## 2018-04-01 VITALS — BP 138/82 | HR 82 | Temp 98.0°F | Ht 64.0 in | Wt 187.0 lb

## 2018-04-01 DIAGNOSIS — R05 Cough: Secondary | ICD-10-CM | POA: Diagnosis not present

## 2018-04-01 DIAGNOSIS — J45991 Cough variant asthma: Secondary | ICD-10-CM

## 2018-04-01 DIAGNOSIS — R053 Chronic cough: Secondary | ICD-10-CM

## 2018-04-01 MED ORDER — PREDNISONE 10 MG PO TABS: ORAL_TABLET | ORAL | 0 refills | Status: DC

## 2018-04-01 MED ORDER — TRAMADOL HCL 50 MG PO TABS: 50.0000 mg | ORAL_TABLET | Freq: Four times a day (QID) | ORAL | 0 refills | Status: DC | PRN

## 2018-04-01 MED ORDER — FLUTTER DEVI: 0 refills | Status: DC

## 2018-04-01 NOTE — Progress Notes (Signed)
Subjective:     Patient ID: Susan Carpenter, female   DOB: January 10, 1946,   MRN: [id number]     Brief patient profile:  58 yowf never smoker with "allergies all her life"manifested by ? HA/some runny nose and pos w/u by Susan Carpenter around 2014 eval Pos to trees/ pollen/ mold/ dust and shots x 2 years but  Had severe skin reactions to shots so they were stopped with no  Difference in symptoms per pt on vs off the shots  and varies zyrtec vs clariton and occ benadryl to control the symptoms  Then developed  insidious onset chronic cough x around 2016 so referred to [facility] 12/20/2017 by Susan Susan [facility]   History of Present Illness  12/20/2017 1st Susan Carpenter office visit/ Susan Carpenter   Chief Complaint  Patient presents with  . [facility] Consult    Referred by Susan. Juluis Carpenter. Pt c/o cough for 3 yrs. She states it's usually worse in the Winter, but "it's bad in the summer too". She states that the cough starts in the evening and will occ wake her up in the night. She has quite a bit of mucus in the mornings- unsure of color.  She also has occ chest tightness and the urge to clear her throat often.   onset was insidious and pattern variable = it can resolve for a few days at most then recurs and sometimes coughs up as much as a sev tsp esp in am's  Kouffman Reflux v Neurogenic Cough Differentiator Reflux Comments  Do you awaken from a sound sleep coughing violently?                            With trouble breathing? Not violently but sporadic   Do you have choking episodes when you cannot  Get enough air, gasping for air ?              no   Do you usually cough when you lie down into  The bed, or when you just lie down to rest ?                          Sporadic/ uses cough drops   Do you usually cough after meals or eating?         Yes   Do you cough when (or after) you bend over?    Yes   GERD SCORE     Kouffman Reflux v Neurogenic Cough Differentiator Neurogenic   Do you more-or-less  cough all day long? sporadic   Does change of temperature make you cough? no   Does laughing or chuckling cause you to cough? If lots of laugh   Do fumes (perfume, automobile fumes, burned  Toast, etc.,) cause you to cough ?      no   Does speaking, singing, or talking on the phone cause you to cough   ?               No    Neurogenic/Airway score      On nexium 20 mg 30 min before bfast and on multiple high dose oil based supplements  Cough to vomit it past  gen chest discomfort only during coughing fits  On plaquenil for alopecia  Not limited by breathing from desired activities  rec Change nexium to 40 mg Take 30- 60 min before your first and last meals of  the day  For drainage / throat tickle try take CHLORPHENIRAMINE  4 mg - take one every 4 hours as needed - GERD   Please see patient coordinator before you leave today  to schedule sinus ct If not improving >>> Prednisone 10 mg take  4 each am x 2 days,   2 each am x 2 days,  1 each am x 2 days and stop  Please schedule a follow up office visit in 6 weeks, sooner if needed  with all medications /inhalers/ solutions in hand so we can verify exactly what you are taking. This includes all medications from all doctors and over the counters - needs feno on return.    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 Follow-up    Cough has improved some. She occ will produce some sputum but she is unsure of color.  She has been having some HA and fatigue that she relates to her allergies.   Dyspnea:  Not limited by sob  Cough: supper time  And after  Sleep: not waking up  SABA use:  None  rec Go ahead and take the prednisone Symbicort 80 Take 2 puffs first thing in am and then another 2 puffs about 12 hours later.  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 Follow-up    She tried the symbicort for 1 wk and her cough got worse so she stopped. She states she went to the beach recently and she did not cough as much.   Dyspnea:  Felt fine during the walk x 27 min flat while at the beach    Cough: after supper and at bedtime min mucoid sputum  symbicort made the cough worse  Prednisone may have helped  rec Please remember to go to the lab department downstairs in the basement  for your tests - we will call you with the results when they are available. GERD  Diet  For drainage / throat tickle try take CHLORPHENIRAMINE  4 mg - take one every 4 hours as needed -   MCT neg 03/17/17    04/01/2018  f/u ov/Susan Carpenter re: cough since 2016 worse x 2 weeks  Chief Complaint  Patient presents with  . Acute Visit    Increased cough since 03/16/18- she feels like sputum comes up but she does not cough it out.   Dyspnea:  Mostly related to cough  Cough: severe but never spits any out ever - always swallow, never able to expectorate Feels fine when wakes up then worse as day goes on  gen chest and upper abd discomfort brought on by coughing fits only '  No obvious day to day or daytime variability or assoc excess/ purulent sputum production  or mucus plugs or hemoptysis or cp or chest tightness, subjective wheeze or overt sinus or hb symptoms. No unusual exposure  hx or h/o childhood pna/ asthma or knowledge of premature birth.  Sleeping  Fine flat  without nocturnal  or early am exacerbation  of respiratory  c/o's or need for noct saba. Also denies any obvious fluctuation of symptoms with weather or environmental  changes or other aggravating or alleviating factors except as outlined above   Current Allergies, Complete Past Medical History, Past Surgical History, Family History, and Social History were reviewed in [employer] record.  ROS  The following are not active complaints unless bolded Hoarseness, sore throat, dysphagia, dental problems, itching, sneezing,  nasal congestion or discharge of excess mucus or purulent secretions, ear ache,   fever, chills, sweats, unintended wt loss or wt gain, classically pleuritic or exertional cp,  orthopnea pnd or arm/hand swelling  or leg swelling, presyncope, palpitations, abdominal pain, anorexia, nausea, vomiting, diarrhea  or change in bowel habits or change in bladder habits, change in stools or change in urine, dysuria, hematuria,  rash, arthralgias, visual complaints, headache, numbness, weakness or ataxia or problems with walking or coordination,  change in mood or  memory.        Current Meds  Medication Sig  . chlorpheniramine (CHLOR-TRIMETON) 4 MG tablet Take 4 mg by mouth every 4 (four) hours as needed for allergies.  . cycloSPORINE (RESTASIS) 0.05 % ophthalmic emulsion Apply to eye.  Marland Kitchen dextromethorphan-guaiFENesin (MUCINEX DM) 30-600 MG 12hr tablet Take 1 tablet by mouth 2 (two) times daily as needed for cough.  . diclofenac sodium (VOLTAREN) 1 % GEL Voltaren Gel 3 grams to 3 large joints upto TID 3 TUBES with 3 refills  . EPIPEN 2-PAK 0.3 MG/0.3ML SOAJ injection use as directed for INJECTION  . esomeprazole (NEXIUM) 40 MG capsule Take 1 capsule (40 mg total) by mouth 2 (two) times daily before a meal.  . finasteride (PROSCAR) 5 MG tablet Take 2.5 mg by mouth daily.  . naproxen sodium (ANAPROX) 220 MG tablet Take by mouth.  . sertraline (ZOLOFT) 50 MG tablet   . TURMERIC PO Herbal Name: Tumeric  . valACYclovir (VALTREX) 1000 MG tablet Take 500 mg by mouth as needed.   .   cetirizine (ZYRTEC) 10 MG tablet Take 10 mg by mouth  daily.  . [  Pseudoeph-Doxylamine-DM-APAP (NYQUIL PO) Take by mouth as needed.  . [  pseudoephedrine (SUDAFED) 30 MG tablet Take 30 mg by mouth every 4 (four) hours as needed for congestion.                        Objective:   Physical Exam  Anxious amb wf with somewhat of a hopeless affect with severe coughing fits with honking quality / never brought up  A speck of mucus   04/01/2018        187  02/27/2018        190  01/30/2018        193   12/20/17 199 lb 3.2 oz (90.4 kg)  04/17/17 201 lb (91.2 kg)  01/15/17 201 lb (91.2 kg)     Vital signs reviewed - Note on arrival 02 sats  97% on RA    HEENT: nl dentition, turbinates bilaterally, and oropharynx which has min cobblestoning. Nl external ear canals without cough reflex   NECK :  without JVD/Nodes/TM/ nl carotid upstrokes bilaterally   LUNGS: no acc muscle use,  Nl contour chest which is clear to A and P bilaterally without cough on insp or exp maneuvers  CV:  RRR  no s3 or murmur or increase in P2, and no edema   ABD:  soft and nontender with nl inspiratory excursion in the supine position. No bruits or organomegaly appreciated, bowel sounds nl  MS:  Nl gait/ ext warm without deformities, calf tenderness, cyanosis or clubbing No obvious joint restrictions   SKIN: warm and dry without lesions    NEURO:  alert, approp, nl sensorium with  no motor or cerebellar deficits apparent.                    Assessment:

## 2018-04-01 NOTE — Patient Instructions (Addendum)
The key to effective treatment for your cough is eliminating the non-stop cycle of cough you're stuck in long enough to let your airway heal completely and then see if there is anything still making you cough once you stop the cough suppression, but this should take no more than 5 days to figure out  Any time you feel the urge to cough, do so into the flutter valve to prevent airway trauma   First take mucinex dm 1200 every 12 hours and supplement if needed with  tramadol 50 mg up to 2 every 4 hours to suppress the urge to cough at all or even clear your throat. Swallowing water or using ice chips/non mint and menthol containing candies (such as lifesavers or sugarless jolly ranchers) are also effective.  You should rest your voice and avoid activities that you know make you cough.  Once you have eliminated the cough for 3 straight days try reducing the tramadol first,  then the mucinex dm   Prednisone 10 mg take  4 each am x 2 days,   2 each am x 2 days,  1 each am x 2 days and stop (this is to eliminate allergies and inflammation from coughing)  GERD (REFLUX)  is an extremely common cause of respiratory symptoms just like yours , many times with no obvious heartburn at all.    It can be treated with medication, but also with lifestyle changes including elevation of the head of your bed (ideally with 6 inch  bed blocks),  Smoking cessation, avoidance of late meals, excessive alcohol, and avoid fatty foods, chocolate, peppermint, colas, red wine, and acidic juices such as orange juice.  NO MINT OR MENTHOL PRODUCTS SO NO COUGH DROPS   USE SUGARLESS CANDY INSTEAD (Jolley ranchers or Stover's or Life Savers) or even ice chips will also do - the key is to swallow to prevent all throat clearing. NO OIL BASED VITAMINS - use powdered substitutes.  Calcium should be gluconate, no  carbonate form of calcium   Please schedule a follow up office visit in 6 weeks, call sooner if needed with all active  medications in hand including over the counters

## 2018-04-02 ENCOUNTER — Encounter: Payer: Self-pay | Admitting: Internal Medicine

## 2018-04-02 NOTE — Assessment & Plan Note (Addendum)
Onset 2016  singulair failed 2017-18 Spirometry 12/20/2017  wnl x for effort dep portion of f/v loop  - FENO 12/20/2017  =   Machine malfunction - max gerd rx plus 1st gen H1 blockers per guidelines  12/20/2017 >>>did not use latter - CT sinus 01/01/2018  Ok  - FENO 01/30/2018  =   13  - 01/30/2018  After extensive coaching inhaler device  effectiveness =    75% try symbicort 80 2bid > cough so stopped - Allergy profile 02/27/2018 >  Eos 0.3 /  IgE 74  RAST Dust > ragweed  - Cylcical cough rex 04/01/2018 and added flutter valve   Lack of cough resolution on a verified empirical regimen could mean an alternative diagnosis(eos bronchitis rather than cough variant asthma- see sep a/p) , persistence of the disease state (eg sinusitis or bronchiectasis - the former already excluded but not the latter, but less likely in absence of early am flare) , or inadequacy of currently available therapy (eg no medical rx available for non-acid gerd)   Of the three most common causes of  Sub-acute / recurrent or chronic cough, only one (GERD)  can actually contribute to/ trigger  the other two (asthma and post nasal drip syndrome)  and perpetuate the cylce of cough.  While not intuitively obvious, many patients with chronic low grade reflux do not cough until there is a primary insult that disturbs the protective epithelial barrier and exposes sensitive nerve endings.   This is typically viral but can due to PNDS and  either may apply here.   The point is that once this occurs, it is difficult to eliminate the cycle  using anything but a maximally effective acid suppression regimen at least in the short run, accompanied by an appropriate diet to address non acid GERD and control / eliminate the cough itself for at least 3 days with use of flutter valve/ tramadol and 1st gen H1 blockers per guidelines  To eliminate all pnds.   I had an extended discussion with the patient reviewing all relevant studies completed to date  and  lasting 25 minutes of a 40  minute acute office visit addressing  re  severe non-specific but potentially very serious refractory respiratory symptoms of uncertain and potentially multiple  etiologies.   Each maintenance medication was reviewed in detail including most importantly the difference between maintenance and prns and under what circumstances the prns are to be triggered using an action plan format that is not reflected in the computer generated alphabetically organized AVS.    Please see AVS for specific instructions unique to this office visit that I personally wrote and verbalized to the the pt in detail and then reviewed with pt  by my nurse highlighting any changes in therapy/plan of care  recommended at today's visit.     Return in 6 weeks with all meds in hand using a trust but verify approach to confirm accurate Medication  Reconciliation The principal here is that until we are certain that the  patients are doing what we've asked, it makes no sense to ask them to do more.

## 2018-04-02 NOTE — Assessment & Plan Note (Addendum)
01/30/2018  After extensive coaching inhaler device  effectiveness =    75% try symb 80 2bid sample> worse so stopped symbicort  - 03/07/17 MCT neg  - cyclical cough rx 04/01/2018 with only short course prednisone  Asthma ruled out but not eos bronchitis so if really does respond to prednisone this time may need to consider bronch for eos prior to refer to ENT for gabapentin trial - see cough a/p

## 2018-04-04 ENCOUNTER — Telehealth: Payer: Self-pay | Admitting: Internal Medicine

## 2018-04-04 NOTE — Telephone Encounter (Signed)
Spoke with patient. She was seen by MW on 5/29 for her cough. Since then, she has been taking prednisone, chlorpheniramine 4mg  and Mucinex DM around the clock. The cough is not getting better. She has now developed headaches and increased fatigue. Productive cough with yellowish-green phlegm. She does not want to take the Tramadol because "it makes her loopy".   She wants to know if MW has any more recommendations for her.   Pharmacy is [facility] [address].   MW, please advise. Thanks!   Current Outpatient Medications on File Prior to Visit  Medication Sig Dispense Refill  . chlorpheniramine (CHLOR-TRIMETON) 4 MG tablet Take 4 mg by mouth every 4 (four) hours as needed for allergies.    . cycloSPORINE (RESTASIS) 0.05 % ophthalmic emulsion Apply to eye.    Marland Kitchen dextromethorphan-guaiFENesin (MUCINEX DM) 30-600 MG 12hr tablet Take 1 tablet by mouth 2 (two) times daily as needed for cough.    . diclofenac sodium (VOLTAREN) 1 % GEL Voltaren Gel 3 grams to 3 large joints upto TID 3 TUBES with 3 refills 3 Tube 3  . EPIPEN 2-PAK 0.3 MG/0.3ML SOAJ injection use as directed for INJECTION  0  . esomeprazole (NEXIUM) 40 MG capsule Take 1 capsule (40 mg total) by mouth 2 (two) times daily before a meal. 60 capsule 2  . finasteride (PROSCAR) 5 MG tablet Take 2.5 mg by mouth daily.    . naproxen sodium (ANAPROX) 220 MG tablet Take by mouth.    . predniSONE (DELTASONE) 10 MG tablet Take  4 each am x 2 days,   2 each am x 2 days,  1 each am x 2 days and stop 14 tablet 0  . Respiratory Therapy Supplies (FLUTTER) DEVI Use as directed 1 each 0  . sertraline (ZOLOFT) 50 MG tablet     . traMADol (ULTRAM) 50 MG tablet Take 1 tablet (50 mg total) by mouth every 6 (six) hours as needed. 40 tablet 0  . TURMERIC PO Herbal Name: Tumeric    . valACYclovir (VALTREX) 1000 MG tablet Take 500 mg by mouth as needed.      No current facility-administered medications on file prior to visit.    The key to effective treatment  for your cough is eliminating the non-stop cycle of cough you're stuck in long enough to let your airway heal completely and then see if there is anything still making you cough once you stop the cough suppression, but this should take no more than 5 days to figure out   Any time you feel the urge to cough, do so into the flutter valve to prevent airway trauma    First take mucinex dm 1200 every 12 hours and supplement if needed with  tramadol 50 mg up to 2 every 4 hours to suppress the urge to cough at all or even clear your throat. Swallowing water or using ice chips/non mint and menthol containing candies (such as lifesavers or sugarless jolly ranchers) are also effective.  You should rest your voice and avoid activities that you know make you cough.   Once you have eliminated the cough for 3 straight days try reducing the tramadol first,  then the mucinex dm    Prednisone 10 mg take  4 each am x 2 days,   2 each am x 2 days,  1 each am x 2 days and stop (this is to eliminate allergies and inflammation from coughing)   GERD (REFLUX)  is an extremely common cause  of respiratory symptoms just like yours , many times with no obvious heartburn at all.     It can be treated with medication, but also with lifestyle changes including elevation of the head of your bed (ideally with 6 inch  bed blocks),  Smoking cessation, avoidance of late meals, excessive alcohol, and avoid fatty foods, chocolate, peppermint, colas, red wine, and acidic juices such as orange juice.  NO MINT OR MENTHOL PRODUCTS SO NO COUGH DROPS   USE SUGARLESS CANDY INSTEAD (Jolley ranchers or Stover's or Life Savers) or even ice chips will also do - the key is to swallow to prevent all throat clearing. NO OIL BASED VITAMINS - use powdered substitutes.   Calcium should be gluconate, no  carbonate form of calcium    Please schedule a follow up office visit in 6 weeks, call sooner if needed with all active medications in hand including  over the counters

## 2018-04-04 NOTE — Telephone Encounter (Signed)
      Signed        If mucus has color to it then rec levaquin 500 mg qd x 7days and change tramadol to tylenol #3 one every 4 hours as needed for cough instead of tramadol but may run into some of the same symptoms

## 2018-04-04 NOTE — Telephone Encounter (Signed)
Attempted to call patient, no answer, left message to call back.  

## 2018-04-07 MED ORDER — LEVOFLOXACIN 500 MG PO TABS: 500.0000 mg | ORAL_TABLET | Freq: Every day | ORAL | 0 refills | Status: DC

## 2018-04-07 MED ORDER — ACETAMINOPHEN-CODEINE #3 300-30 MG PO TABS: 1.0000 | ORAL_TABLET | ORAL | 0 refills | Status: DC | PRN

## 2018-04-07 NOTE — Telephone Encounter (Signed)
Pt is calling back [phone]

## 2018-04-07 NOTE — Telephone Encounter (Signed)
Called and spoke with patient, advised her of MW recommendations, she verbalized understanding. Medications have been called in. Per MW patient can have 40 of tylenol with no refills.  Nothing further needed.

## 2018-04-10 ENCOUNTER — Telehealth: Payer: Self-pay | Admitting: Internal Medicine

## 2018-04-10 NOTE — Telephone Encounter (Signed)
Called and spoke with patient, advised her of response. Patient has been scheduled to see Arlys JohnBrian on 6.7.19

## 2018-04-10 NOTE — Telephone Encounter (Signed)
No further treatment over the phone as very hard to sort out even at ov > needs to bring all meds with her to see first avail md/np

## 2018-04-10 NOTE — Progress Notes (Signed)
 @Patient  ID: Susan Carpenter, female    DOB: 06/29/1946, 72 y.o.   MRN: [id number]  Chief Complaint  Patient presents with  . Acute Visit    tried tylenol, on 4d of levaquin, unsure its working, 3 wk of symtoms    Referring provider: Juluis RainierBarnes, Elizabeth, MD  HPI:  [id number] yowf never smoker with "allergies all her life" manifested by ? HA/some runny nose and pos w/u by Dr Irena CordsVan Winkle around 2014 eval Pos to trees/ pollen/ mold/ dust and shots x 2 years but  Had severe skin reactions to shots so they were stopped with no  Difference in symptoms per pt on vs off the shots  and varies zyrtec vs claritin and occ benadryl to control the symptoms  Then developed  insidious onset chronic cough x around 2016 so referred to pulmonary clinic 12/20/2017 by Dr Zachery DauerBarnes  Recent [facility] Encounters:  04/01/2018-chronic cough-Wert If symptoms continue consider bronchoscopy if patient was ever responsive to prednisone Can also consider trial of gabapentin, potentially also referral to ENT - Cylcical cough rex 04/01/2018 and added flutter valve   03/10/18 can start on gabapentin 100 tid or be referred to [facility]/ Dr Harriette OharaStephen Wright for what is likely a throat problem based on her evaluation to date - if starts on the gabapentin ov in one month  - max gerd rx plus 1st gen H1 blockers per guidelines  12/20/2017 >>>did not use latter  singulair failed 2017-18  Tests:   Spirometry 12/20/2017  wnl x for effort dep portion of f/v loop  FENO 01/30/2018  =   13  03/07/2018-pulmonary function test- study unremarkable no evidence of asthma, no obstruction seen, no bronchodilator response, study may have been limited based off of patient effort which was erratic,   Imaging:  01/01/2018-CT maxillofacial-clear paranasal sinuses, midline nasal septum, no incidental intracranial, orbital, or soft tissue finding 12/20/2017-chest x-ray- no active cardiopulmonary disease  Cardiac:   Labs:  02/27/2018-CBC with differential   -wnl  02/27/2018-respiratory allergy panel-mild allergies to dust and ragweed >>> - Allergy profile 02/27/2018 >  Eos 0.3 /  IgE 74  RAST Dust > ragweed   Micro:   Chart Review:    04/11/18 Acute Presenting today with acute on chronic cough.  Patient reporting that symptoms for the last 3 weeks have been a productive cough with yellow/green sputum.  Via telephone encounter and chart reviewed and see that patient was started on Levaquin.  Patient on her fourth dose today.  Patient did miss a dose yesterday because she did not feel like it was working.  Will discuss with patient today.  Patient reports adherence to her cough regimen and GERD management with Dr. Sherene SiresWert.  Patient says that typically prednisone works for her with the last prednisone taper only moderately improved symptoms.  Also via patient today as well as via chart review via patient did not respond well to Tylenol 3.  Patient reporting a multitude of symptoms such as nausea, diarrhea, and "made me loopy".  Patient is no longer taking.  Patient has been adherent with using her flutter valve 5-6 times a day.   Allergies  Allergen Reactions  . Cephalexin     urticaria  . Oyster UnumProvidentShell Hives  . Penicillins     urticaria  . Shellfish Allergy Anaphylaxis  . Doxycycline Itching  . Erythromycin     Resulted in welts all over body     Immunization History  Administered Date(s) Administered  . Influenza Whole  08/05/2004  . Influenza-Unspecified 08/05/2013  . Td 07/06/2004    Past Medical History:  Diagnosis Date  . Allergy    rhinitis  . ANA positive   . Arthritis   . Complication of anesthesia   . Constipation   . Fibromyalgia   . GERD (gastroesophageal reflux disease)   . Heart murmur   . Helicobacter pylori (H. pylori) infection    s/p treatment  . Hiatal hernia   . Interstitial cystitis   . Pain in joint    site unspecified  . PONV (postoperative nausea and vomiting)   . Psoriasis   . Psoriatic arthritis  (HCC)     Tobacco History: Social History   Tobacco Use  Smoking Status Never Smoker  Smokeless Tobacco Never Used   Counseling given: Not Answered Continue not smoking  Outpatient Encounter Medications as of 04/11/2018  Medication Sig  . chlorpheniramine (CHLOR-TRIMETON) 4 MG tablet Take 4 mg by mouth every 4 (four) hours as needed for allergies.  . cycloSPORINE (RESTASIS) 0.05 % ophthalmic emulsion Apply to eye.  Marland Kitchen dextromethorphan-guaiFENesin (MUCINEX DM) 30-600 MG 12hr tablet Take 1 tablet by mouth 2 (two) times daily as needed for cough.  . diclofenac sodium (VOLTAREN) 1 % GEL Voltaren Gel 3 grams to 3 large joints upto TID 3 TUBES with 3 refills  . EPIPEN 2-PAK 0.3 MG/0.3ML SOAJ injection use as directed for INJECTION  . esomeprazole (NEXIUM) 40 MG capsule Take 1 capsule (40 mg total) by mouth 2 (two) times daily before a meal.  . finasteride (PROSCAR) 5 MG tablet Take 2.5 mg by mouth daily.  Marland Kitchen levofloxacin (LEVAQUIN) 500 MG tablet Take 1 tablet (500 mg total) by mouth daily.  . naproxen sodium (ANAPROX) 220 MG tablet Take by mouth.  . Respiratory Therapy Supplies (FLUTTER) DEVI Use as directed  . sertraline (ZOLOFT) 50 MG tablet   . traMADol (ULTRAM) 50 MG tablet Take 1 tablet (50 mg total) by mouth every 6 (six) hours as needed.  . TURMERIC PO Herbal Name: Tumeric  . valACYclovir (VALTREX) 1000 MG tablet Take 500 mg by mouth as needed.   Marland Kitchen acetaminophen-codeine (TYLENOL #3) 300-30 MG tablet Take 1 tablet by mouth every 4 (four) hours as needed (for cough). (Patient not taking: Reported on 04/11/2018)  . benzonatate (TESSALON) 200 MG capsule Take 1 capsule (200 mg total) by mouth 3 (three) times daily as needed for cough.  . predniSONE (DELTASONE) 10 MG tablet Take  4 each am x 2 days,   2 each am x 2 days,  1 each am x 2 days and stop  . [DISCONTINUED] predniSONE (DELTASONE) 10 MG tablet Take  4 each am x 2 days,   2 each am x 2 days,  1 each am x 2 days and stop (Patient not  taking: Reported on 04/11/2018)   No facility-administered encounter medications on file as of 04/11/2018.      Review of Systems  Constitutional:  +fatigue, poor sleep  No  weight loss, night sweats,  fevers, chills HEENT:  +HA at times  No  Difficulty swallowing,  Tooth/dental problems, or  Sore throat, No sneezing, itching, ear ache, nasal congestion, post nasal drip  CV: No chest pain,  orthopnea, PND, swelling in lower extremities, anasarca, dizziness, palpitations, syncope  GI: No heartburn, indigestion, abdominal pain, nausea, vomiting, diarrhea, change in bowel habits, loss of appetite, bloody stools Resp: +sob with exertion/ sometimes at rest, cough with productive - yellow/green/brown thick,   No coughing up  of blood. No wheezing.  No chest wall deformity Skin: no rash, lesions, no skin changes. GU: no dysuria, change in color of urine, no urgency or frequency.  No flank pain, no hematuria  MS:  No joint pain or swelling.  No decreased range of motion.  No back pain. Psych:  No change in mood or affect. No depression or anxiety.  No memory loss.   Physical Exam  BP 130/70   Pulse (!) 105   Temp 98.2 F (36.8 C) (Oral)   Ht 5\' 4"  (1.626 m)   Wt 188 lb 3.2 oz (85.4 kg)   SpO2 97%   BMI 32.30 kg/m  Wt Readings from Last 3 Encounters:  04/11/18 188 lb 3.2 oz (85.4 kg)  04/01/18 187 lb (84.8 kg)  02/27/18 190 lb (86.2 kg)   GEN: A/Ox3; pleasant , NAD, well nourished    HEENT:  [place]/AT,  EACs-clear, TMs-wnl, NOSE-clear, THROAT-clear, no lesions, +postnasal drip   Sinus - non tender to palpation   NECK:  Supple w/ fair ROM; no JVD; normal carotid impulses w/o bruits; no thyromegaly or nodules palpated; no lymphadenopathy.    RESP:  +occasional squeaks, no wheezes  no accessory muscle use, no dullness to percussion  CARD:  RRR, no m/r/g, no peripheral edema, pulses intact, no cyanosis or clubbing.  GI:   Soft & nt; nml bowel sounds; no organomegaly or masses detected.    Musco: Warm bil, no deformities or joint swelling noted.   Neuro: alert, no focal deficits noted.    Skin: Warm, no lesions or rashes    Lab Results:  CBC    Component Value Date/Time   WBC 6.5 02/27/2018 1014   RBC 4.55 02/27/2018 1014   HGB 13.7 02/27/2018 1014   HCT 40.7 02/27/2018 1014   PLT 227.0 02/27/2018 1014   MCV 89.4 02/27/2018 1014   MCH 29.8 01/15/2017 1246   MCHC 33.7 02/27/2018 1014   RDW 13.9 02/27/2018 1014   LYMPHSABS 1.7 02/27/2018 1014   MONOABS 0.5 02/27/2018 1014   EOSABS 0.3 02/27/2018 1014   BASOSABS 0.1 02/27/2018 1014    BMET    Component Value Date/Time   NA 140 01/15/2017 1246   K 4.6 01/15/2017 1246   CL 106 01/15/2017 1246   CO2 25 01/15/2017 1246   GLUCOSE 96 01/15/2017 1246   BUN 17 01/15/2017 1246   CREATININE 0.82 01/15/2017 1246   CALCIUM 9.2 01/15/2017 1246   GFRNONAA 73 01/15/2017 1246   GFRAA 84 01/15/2017 1246    BNP No results found for: BNP  ProBNP No results found for: PROBNP  Imaging: No results found.   Assessment & Plan:   Acute on chronic bronchitis versus is most likely diagnosis.  Probably exacerbated by GERD and postnasal drip.  Patient informed to continue taking her Levaquin as prescribed, will do chest x-ray today, Tessalon Perles and over-the-counter Delsym can be used to manage cough but to try to get out as much mucus as possible and use flutter valve at least 4-6 times a day.  Can use the over-the-counter cough regimens or Tessalon to help with sleep at night.  Patient encouraged to have close follow-up with our office as we continue to work on these acute as well as chronic symptoms.  Once cough is no longer productive him we rule out a infection we can definitely consider gabapentin for chronic cough as well as a referral to ENT if we would like.  Patient agreed with plan of  care.  Bring patient back in four weeks or sooner if symptoms are not improving  Cough variant asthma vs uacs Continue  cough management as discussed with Dr. Sherene Sires previous appointments Will also offer Tessalon Perles to help with cough Continue flutter valve use 5-6 times a day Follow-up with our office if symptoms worsen, fever develops, you have concerns regarding your respiratory status  Will do chest x-ray today Continue Levaquin Prednisone taper today   Allergic rhinitis Continue Chlortab's Continue nasal saline rinses as needed   Chronic cough Chest x-ray today Finish Levaquin  Prednisone taper today Can use over-the-counter cough medicine as needed to help with sleep and cough management Tessalon Perles prescribed today        Coral Ceo, NP 04/11/2018

## 2018-04-10 NOTE — Telephone Encounter (Signed)
Called and spoke to pt.  Pt called in on 04/07/18 and was prescribed Levaquin. Pt states she is on day 4 of abx.  Pt was also prescribed prednisone on 04/01/18- completed course.  Pt states symptoms have not improved with abx and prednisone.  C/o prod cough with brown to green mucus, wheezing, increased sob with exertion & nasal drainage x 3w I have offered an apt, pt stated she would like recommendations first as she is unsure if this viral.   MW please advise.  04/01/18 AVS The key to effective treatment for your cough is eliminating the non-stop cycle of cough you're stuck in long enough to let your airway heal completely and then see if there is anything still making you cough once you stop the cough suppression, but this should take no more than 5 days to figure out  Any time you feel the urge to cough, do so into the flutter valve to prevent airway trauma   First take mucinex dm 1200 every 12 hours and supplement if needed with  tramadol 50 mg up to 2 every 4 hours to suppress the urge to cough at all or even clear your throat. Swallowing water or using ice chips/non mint and menthol containing candies (such as lifesavers or sugarless jolly ranchers) are also effective.  You should rest your voice and avoid activities that you know make you cough.  Once you have eliminated the cough for 3 straight days try reducing the tramadol first,  then the mucinex dm   Prednisone 10 mg take  4 each am x 2 days,   2 each am x 2 days,  1 each am x 2 days and stop (this is to eliminate allergies and inflammation from coughing)  GERD (REFLUX)  is an extremely common cause of respiratory symptoms just like yours , many times with no obvious heartburn at all.    It can be treated with medication, but also with lifestyle changes including elevation of the head of your bed (ideally with 6 inch  bed blocks),  Smoking cessation, avoidance of late meals, excessive alcohol, and avoid fatty foods, chocolate,  peppermint, colas, red wine, and acidic juices such as orange juice.  NO MINT OR MENTHOL PRODUCTS SO NO COUGH DROPS   USE SUGARLESS CANDY INSTEAD (Jolley ranchers or Stover's or Life Savers) or even ice chips will also do - the key is to swallow to prevent all throat clearing. NO OIL BASED VITAMINS - use powdered substitutes.  Calcium should be gluconate, no  carbonate form of calcium   Please schedule a follow up office visit in 6 weeks, call sooner if needed with all active medications in hand including over the counters

## 2018-04-11 ENCOUNTER — Ambulatory Visit: Payer: Medicare Other | Admitting: Pulmonary Disease

## 2018-04-11 ENCOUNTER — Ambulatory Visit (INDEPENDENT_AMBULATORY_CARE_PROVIDER_SITE_OTHER)
Admission: RE | Admit: 2018-04-11 | Discharge: 2018-04-11 | Disposition: A | Discharge status: Home / Self Care | Payer: Medicare Other | Source: Ambulatory Visit | Attending: Pulmonary Disease | Admitting: Pulmonary Disease

## 2018-04-11 ENCOUNTER — Encounter: Payer: Self-pay | Admitting: Pulmonary Disease

## 2018-04-11 DIAGNOSIS — J309 Allergic rhinitis, unspecified: Secondary | ICD-10-CM | POA: Diagnosis not present

## 2018-04-11 DIAGNOSIS — R05 Cough: Secondary | ICD-10-CM

## 2018-04-11 DIAGNOSIS — R053 Chronic cough: Secondary | ICD-10-CM

## 2018-04-11 DIAGNOSIS — J45991 Cough variant asthma: Secondary | ICD-10-CM

## 2018-04-11 MED ORDER — BENZONATATE 200 MG PO CAPS: 200.0000 mg | ORAL_CAPSULE | Freq: Three times a day (TID) | ORAL | 1 refills | Status: DC | PRN

## 2018-04-11 MED ORDER — PREDNISONE 10 MG PO TABS: ORAL_TABLET | ORAL | 0 refills | Status: DC

## 2018-04-11 NOTE — Assessment & Plan Note (Signed)
Continue Chlortab's Continue nasal saline rinses as needed

## 2018-04-11 NOTE — Assessment & Plan Note (Addendum)
Chest x-ray today Finish Levaquin  Prednisone taper today Can use over-the-counter cough medicine as needed to help with sleep and cough management Tessalon Perles prescribed today

## 2018-04-11 NOTE — Progress Notes (Signed)
Your chest x-ray results today show no acute findings or pneumonia.  Continue with her current treatment plan.  Continue your Levaquin as prescribed.  Elisha HeadlandBrian Mack FNP

## 2018-04-11 NOTE — Patient Instructions (Addendum)
Chest x-ray today   Finish Levaquin as prescribed >>> Take with food >>> Eat yogurt or take probiotic to restore good gut bacteria  Prednisone (10mg  tablet) >>> 4 tabs for 2 days, then 3 tabs for 2 days, 2 tabs for 2 days, then 1 tab for 2 days, then stop  Use over-the-counter regimens to manage cough as needed to help with sleep such as Delsym  Due to reaction of with codeine will offer Tessalon Perles today use as needed for cough   Continue close follow-up with her office.  If you have difficulties eating and drinking and hydrating appropriately please let us know.  Continue your other cough regimen as discussed with Dr. Sherene SiresWert.   Follow-up with Dr. Sherene SiresWert myself in 4 weeks.  Or sooner if your symptoms do not improve.    Please contact the office if your symptoms worsen or you have concerns that you are not improving.   Thank you for choosing [facility] for your healthcare, and for allowing us to partner with you on your healthcare journey. I am thankful to be able to provide care to you today.   Elisha HeadlandBrian Mack FNP-C

## 2018-04-11 NOTE — Assessment & Plan Note (Addendum)
Continue cough management as discussed with Dr. Sherene SiresWert previous appointments Will also offer Tessalon Perles to help with cough Continue flutter valve use 5-6 times a day Follow-up with our office if symptoms worsen, fever develops, you have concerns regarding your respiratory status  Will do chest x-ray today Continue Levaquin Prednisone taper today

## 2018-05-06 ENCOUNTER — Other Ambulatory Visit: Payer: Self-pay | Admitting: Internal Medicine

## 2018-05-15 ENCOUNTER — Ambulatory Visit: Payer: Medicare Other | Admitting: Internal Medicine

## 2018-05-15 ENCOUNTER — Encounter: Payer: Self-pay | Admitting: Internal Medicine

## 2018-05-15 VITALS — BP 140/82 | HR 83 | Ht 64.0 in | Wt 191.0 lb

## 2018-05-15 DIAGNOSIS — J45991 Cough variant asthma: Secondary | ICD-10-CM | POA: Diagnosis not present

## 2018-05-15 DIAGNOSIS — R053 Chronic cough: Secondary | ICD-10-CM

## 2018-05-15 DIAGNOSIS — R05 Cough: Secondary | ICD-10-CM | POA: Diagnosis not present

## 2018-05-15 DIAGNOSIS — R059 Cough, unspecified: Secondary | ICD-10-CM

## 2018-05-15 NOTE — Patient Instructions (Addendum)
For cough   >  mucinex dm 1200 mg every 12 hours and use the flutter valve as much as you can    Please see patient coordinator before you leave today  to schedule HRCT of chest and I will call you with results   Please schedule a follow up office visit in 4  weeks, call sooner if needed with all medications /inhalers/ solutions in hand so we can verify exactly what you are taking. This includes all medications from all doctors and over the counters - PLEASE separate them into two bags:  the ones you take automatically, no matter what, vs the ones you take just when you feel you need them "BAG #2 is UP TO YOU"  - this will really help us help you take your medications more effectively.

## 2018-05-15 NOTE — Progress Notes (Signed)
Subjective:     Patient ID: Susan Carpenter, female   DOB: 07/19/1946,   MRN: [id number]     Brief patient profile:  [id number] yowf  never smoker with "allergies all her life"manifested by ? HA/some runny nose and pos w/u by Dr Irena CordsVan Winkle around 2014 eval Pos to trees/ pollen/ mold/ dust and shots x 2 years but  Had severe skin reactions to shots so they were stopped with no  Difference in symptoms per pt on vs off the shots  and varies zyrtec vs clariton and occ benadryl to control the symptoms  Then developed  insidious onset chronic cough x around 2016 so referred to pulmonary clinic 12/20/2017 by Dr Zachery DauerBarnes   History of Present Illness  12/20/2017 1st [facility] office visit/ Xzayvion Vaeth   Chief Complaint  Patient presents with  . Pulmonary Consult    Referred by Dr. Juluis RainierElizabeth Barnes. Pt c/o cough for 3 yrs. She states it's usually worse in the Winter, but "it's bad in the summer too". She states that the cough starts in the evening and will occ wake her up in the night. She has quite a bit of mucus in the mornings- unsure of color.  She also has occ chest tightness and the urge to clear her throat often.   onset was insidious and pattern variable = it can resolve for a few days at most then recurs and sometimes coughs up as much as a sev tsp esp in am's  Kouffman Reflux v Neurogenic Cough Differentiator Reflux Comments  Do you awaken from a sound sleep coughing violently?                            With trouble breathing? Not violently but sporadic   Do you have choking episodes when you cannot  Get enough air, gasping for air ?              no   Do you usually cough when you lie down into  The bed, or when you just lie down to rest ?                          Sporadic/ uses cough drops   Do you usually cough after meals or eating?         Yes   Do you cough when (or after) you bend over?    Yes   GERD SCORE     Kouffman Reflux v Neurogenic Cough Differentiator Neurogenic   Do you more-or-less  cough all day long? sporadic   Does change of temperature make you cough? no   Does laughing or chuckling cause you to cough? If lots of laugh   Do fumes (perfume, automobile fumes, burned  Toast, etc.,) cause you to cough ?      no   Does speaking, singing, or talking on the phone cause you to cough   ?               No    Neurogenic/Airway score      On nexium 20 mg 30 min before bfast and on multiple high dose oil based supplements  Cough to vomit it past  gen chest discomfort only during coughing fits  On plaquenil for alopecia  Not limited by breathing from desired activities  rec Change nexium to 40 mg Take 30- 60 min before your first and last meals  of the day  For drainage / throat tickle try take CHLORPHENIRAMINE  4 mg - take one every 4 hours as needed - GERD   Please see patient coordinator before you leave today  to schedule sinus ct If not improving >>> Prednisone 10 mg take  4 each am x 2 days,   2 each am x 2 days,  1 each am x 2 days and stop  Please schedule a follow up office visit in 6 weeks, sooner if needed  with all medications /inhalers/ solutions in hand so we can verify exactly what you are taking. This includes all medications from all doctors and over the counters - needs feno on return.    01/30/2018  f/u ov/Melondy Blanchard re:  uacs vs cough variant asthma / poor cough control since 2016/ did not bring all meds Chief Complaint  Patient presents with  . Follow-up    Cough has improved some. She occ will produce some sputum but she is unsure of color.  She has been having some HA and fatigue that she relates to her allergies.   Dyspnea:  Not limited by sob  Cough: supper time  And after  Sleep: not waking up  SABA use:  None  rec Go ahead and take the prednisone Symbicort 80 Take 2 puffs first thing in am and then another 2 puffs about 12 hours later.  Work on inhaler technique:    Stop the clariton and zyrtec and try  CHLORPHENIRAMINE  4 mg - take one - two every  4 hours as needed - available over the counter- may cause drowsiness so start with just a bedtime dose or two and see how you tolerate it before trying in daytime      02/27/2018  f/u ov/Krishauna Schatzman re: uacs vs cough variant asthma/ poor control since 2016/brought meds but not clear she ever tried h1 as rec last ov  Chief Complaint  Patient presents with  . Follow-up    She tried the symbicort for 1 wk and her cough got worse so she stopped. She states she went to the beach recently and she did not cough as much.   Dyspnea:  Felt fine during the walk x 27 min flat while at the beach    Cough: after supper and at bedtime min mucoid sputum  symbicort made the cough worse  Prednisone may have helped  rec Please remember to go to the lab department downstairs in the basement  for your tests - we will call you with the results when they are available. GERD  Diet  For drainage / throat tickle try take CHLORPHENIRAMINE  4 mg - take one every 4 hours as needed -   MCT neg 03/17/17  04/01/2018  f/u ov/Fredricka Kohrs re: cough since 2016 worse x 2 weeks  Chief Complaint  Patient presents with  . Acute Visit    Increased cough since 03/16/18- she feels like sputum comes up but she does not cough it out.   Dyspnea:  Mostly related to cough  Cough: severe but never spits any out ever - always swallow, never able to expectorate Feels fine when wakes up then worse as day goes on  gen chest and upper abd discomfort brought on by coughing fits on rec The key to effective treatment for your cough is eliminating the non-stop cycle of cough you're stuck in long enough to let your airway heal completely and then see if there is anything still making you cough once  you stop the cough suppression, but this should take no more than 5 days to figure out Any time you feel the urge to cough, do so into the flutter valve to prevent airway trauma  First take mucinex dm 1200 every 12 hours and supplement if needed with  tramadol 50 mg  up to 2 every 4 hours Prednisone 10 mg take  4 each am x 2 days,   2 each am x 2 days,  1 each am x 2 days and stop (this is to eliminate allergies and inflammation from coughing) GERD diet Calcium should be gluconate, no  carbonate form of calcium  Please schedule a follow up office visit in 6 weeks, call sooner if needed with all active medications in hand including over the counters       05/15/2018  f/u ov/Lizzie An re: cough x 2016   Chief Complaint  Patient presents with  . Follow-up    Cough has improved some, but still present. She still has some green to brown sputum.    Dyspnea:  MMRC1 = can walk nl pace, flat grade, can't hurry or go uphills or steps s sob   Cough: worse mid morning now >>>  A tsp of thick mucus slt green, levaquin did not help (rx 04/07/18 ) not using mucinex dm or flutter as instructed  Sleeping: on either side with one pillow s noct cough  SABA use: none 02: none   Saw allergy 05/14/18 > neg skin testing x for dust >>   pnds improved with atrovent NS   No obvious day to day or daytime variability or assoc  mucus plugs or hemoptysis or cp or chest tightness, subjective wheeze or overt sinus or hb symptoms.   Sleeping: on either side with one pillow without nocturnal  or early am exacerbation  of respiratory  c/o's or need for noct saba. Also denies any obvious fluctuation of symptoms with weather or environmental changes or other aggravating or alleviating factors except as outlined above   No unusual exposure hx or h/o childhood pna/ asthma or knowledge of premature birth.  Current Allergies, Complete Past Medical History, Past Surgical History, Family History, and Social History were reviewed in [employer] record.  ROS  The following are not active complaints unless bolded Hoarseness, sore throat, dysphagia, dental problems, itching, sneezing,  nasal congestion or discharge of excess mucus or purulent secretions, ear ache,   fever,  chills, sweats, unintended wt loss or wt gain, classically pleuritic or exertional cp,  orthopnea pnd or arm/hand swelling  or leg swelling, presyncope, palpitations, abdominal pain, anorexia, nausea, vomiting, diarrhea  or change in bowel habits or change in bladder habits, change in stools or change in urine, dysuria, hematuria,  rash, arthralgias, visual complaints, headache, numbness, weakness or ataxia or problems with walking or coordination,  change in mood or  memory.        Current Meds  Medication Sig  . chlorpheniramine (CHLOR-TRIMETON) 4 MG tablet Take 4 mg by mouth every 4 (four) hours as needed for allergies.  . cycloSPORINE (RESTASIS) 0.05 % ophthalmic emulsion Apply to eye.  . diclofenac sodium (VOLTAREN) 1 % GEL Voltaren Gel 3 grams to 3 large joints upto TID 3 TUBES with 3 refills  . EPIPEN 2-PAK 0.3 MG/0.3ML SOAJ injection use as directed for INJECTION  . esomeprazole (NEXIUM) 40 MG capsule TAKE 1 CAPSULE BY MOUTH TWICE DAILY BEFORE A MEAL  . finasteride (PROSCAR) 5 MG tablet Take 2.5 mg  by mouth daily.  Marland Kitchen ipratropium (ATROVENT) 0.03 % nasal spray Place 1-2 sprays into the nose 3 (three) times daily as needed.  . naproxen sodium (ANAPROX) 220 MG tablet Take by mouth.  . Respiratory Therapy Supplies (FLUTTER) DEVI Use as directed  . sertraline (ZOLOFT) 50 MG tablet   . TURMERIC PO Herbal Name: Tumeric  .     Marland Kitchen                                 Objective:   Physical Exam  amb wf nad    05/15/2018        191  04/01/2018        187  02/27/2018        190  01/30/2018        193   12/20/17 199 lb 3.2 oz (90.4 kg)  04/17/17 201 lb (91.2 kg)  01/15/17 201 lb (91.2 kg)     Vital signs reviewed - Note on arrival 02 sats  91% on RA    HEENT: nl dentition, turbinates bilaterally, and oropharynx x for minimal cobblestoning/ watery pnd. Nl external ear canals without cough reflex   NECK :  without JVD/Nodes/TM/ nl carotid upstrokes bilaterally   LUNGS:  no acc muscle use,  Nl contour chest which is clear to A and P bilaterally with very harsh cough brought on by  exp maneuvers   CV:  RRR  no s3 or murmur or increase in P2, and no edema   ABD:  soft and nontender with nl inspiratory excursion in the supine position. No bruits or organomegaly appreciated, bowel sounds nl  MS:  Nl gait/ ext warm without deformities, calf tenderness, cyanosis or clubbing No obvious joint restrictions   SKIN: warm and dry without lesions    NEURO:  alert, approp, nl sensorium with  no motor or cerebellar deficits apparent.                 Assessment:

## 2018-05-16 ENCOUNTER — Encounter: Payer: Self-pay | Admitting: Internal Medicine

## 2018-05-16 NOTE — Assessment & Plan Note (Addendum)
Onset 2016  singulair failed 2017-18 Spirometry 12/20/2017  wnl x for effort dep portion of f/v loop  - FENO 12/20/2017  =   Machine malfunction - max gerd rx plus 1st gen H1 blockers per guidelines  12/20/2017 >>>did not use latter - CT sinus 01/01/2018  Ok  - FENO 01/30/2018  =   13  - 01/30/2018  After extensive coaching inhaler device  effectiveness =    75% try symbicort 80 2bid > cough so stopped  - Allergy profile 02/27/2018 >  Eos 0.3 /  IgE 74  RAST Dust > ragweed   - HRCT 05/15/2018 >>>   Working through the differential diagnosis of chronic cough we have not yet excluded bronchiectasis which might explain the cough on expiration today but I still strongly favor an upper airway source of the cough and agree with continuing Atrovent to eliminate as much postnasal drainage as possible.  Discussed in detail all the  indications, usual  risks and alternatives  relative to the benefits with patient who agrees to proceed with w/u as outlined=  No change rx/ proceed with HRCT chest/ in meantime emphasize the appropriate use of Mucinex DM and flutter valve to prevent cyclical coughing.     I had an extended discussion with the patient reviewing all relevant studies completed to date and  lasting 15 to 20 minutes of a 25 minute visit    Each maintenance medication was reviewed in detail including most importantly the difference between maintenance and prns and under what circumstances the prns are to be triggered using an action plan format that is not reflected in the computer generated alphabetically organized AVS.    Please see AVS for specific instructions unique to this visit that I personally wrote and verbalized to the the pt in detail and then reviewed with pt  by my nurse highlighting any  changes in therapy recommended at today's visit to their plan of care.

## 2018-05-16 NOTE — Assessment & Plan Note (Signed)
01/30/2018  After extensive coaching inhaler device  effectiveness =    75% try symb 80 2bid sample> worse so stopped symbicort  - 03/07/17 MCT neg   No evidence to support asthma here.

## 2018-05-28 ENCOUNTER — Ambulatory Visit (INDEPENDENT_AMBULATORY_CARE_PROVIDER_SITE_OTHER)
Admission: RE | Admit: 2018-05-28 | Discharge: 2018-05-28 | Disposition: A | Discharge status: Home / Self Care | Payer: Medicare Other | Source: Ambulatory Visit | Attending: Internal Medicine | Admitting: Internal Medicine

## 2018-05-28 DIAGNOSIS — R059 Cough, unspecified: Secondary | ICD-10-CM

## 2018-05-28 DIAGNOSIS — R053 Chronic cough: Secondary | ICD-10-CM

## 2018-05-28 DIAGNOSIS — R05 Cough: Secondary | ICD-10-CM | POA: Diagnosis not present

## 2018-05-29 NOTE — Progress Notes (Signed)
Spoke with pt and notified of results per Dr. Wert. Pt verbalized understanding and denied any questions. 

## 2018-06-23 ENCOUNTER — Ambulatory Visit: Payer: Medicare Other | Admitting: Internal Medicine

## 2018-06-23 ENCOUNTER — Encounter: Payer: Self-pay | Admitting: Internal Medicine

## 2018-06-23 VITALS — BP 118/74 | HR 88 | Ht 64.0 in | Wt 188.0 lb

## 2018-06-23 DIAGNOSIS — R053 Chronic cough: Secondary | ICD-10-CM

## 2018-06-23 DIAGNOSIS — R05 Cough: Secondary | ICD-10-CM | POA: Diagnosis not present

## 2018-06-23 MED ORDER — GABAPENTIN 100 MG PO CAPS: 100.0000 mg | ORAL_CAPSULE | Freq: Three times a day (TID) | ORAL | 2 refills | Status: DC

## 2018-06-23 MED ORDER — PREDNISONE 10 MG PO TABS
ORAL_TABLET | ORAL | 0 refills | Status: DC
Start: 2018-06-23 — End: 2018-08-04

## 2018-06-23 NOTE — Progress Notes (Signed)
Subjective:     Patient ID: Susan Carpenter, female   DOB: 07/19/1946,   MRN: [id number]     Brief patient profile:  [id number] yowf  never smoker with "allergies all her life"manifested by ? HA/some runny nose and pos w/u by Dr Irena CordsVan Winkle around 2014 eval Pos to trees/ pollen/ mold/ dust and shots x 2 years but  Had severe skin reactions to shots so they were stopped with no  Difference in symptoms per pt on vs off the shots  and varies zyrtec vs clariton and occ benadryl to control the symptoms  Then developed  insidious onset chronic cough x around 2016 so referred to pulmonary clinic 12/20/2017 by Dr Zachery DauerBarnes   History of Present Illness  12/20/2017 1st [facility] office visit/ Susan Carpenter   Chief Complaint  Patient presents with  . Pulmonary Consult    Referred by Dr. Juluis RainierElizabeth Barnes. Pt c/o cough for 3 yrs. She states it's usually worse in the Winter, but "it's bad in the summer too". She states that the cough starts in the evening and will occ wake her up in the night. She has quite a bit of mucus in the mornings- unsure of color.  She also has occ chest tightness and the urge to clear her throat often.   onset was insidious and pattern variable = it can resolve for a few days at most then recurs and sometimes coughs up as much as a sev tsp esp in am's  Kouffman Reflux v Neurogenic Cough Differentiator Reflux Comments  Do you awaken from a sound sleep coughing violently?                            With trouble breathing? Not violently but sporadic   Do you have choking episodes when you cannot  Get enough air, gasping for air ?              no   Do you usually cough when you lie down into  The bed, or when you just lie down to rest ?                          Sporadic/ uses cough drops   Do you usually cough after meals or eating?         Yes   Do you cough when (or after) you bend over?    Yes   GERD SCORE     Kouffman Reflux v Neurogenic Cough Differentiator Neurogenic   Do you more-or-less  cough all day long? sporadic   Does change of temperature make you cough? no   Does laughing or chuckling cause you to cough? If lots of laugh   Do fumes (perfume, automobile fumes, burned  Toast, etc.,) cause you to cough ?      no   Does speaking, singing, or talking on the phone cause you to cough   ?               No    Neurogenic/Airway score      On nexium 20 mg 30 min before bfast and on multiple high dose oil based supplements  Cough to vomit it past  gen chest discomfort only during coughing fits  On plaquenil for alopecia  Not limited by breathing from desired activities  rec Change nexium to 40 mg Take 30- 60 min before your first and last meals  of the day  For drainage / throat tickle try take CHLORPHENIRAMINE  4 mg - take one every 4 hours as needed - GERD   Please see patient coordinator before you leave today  to schedule sinus ct If not improving >>> Prednisone 10 mg take  4 each am x 2 days,   2 each am x 2 days,  1 each am x 2 days and stop  Please schedule a follow up office visit in 6 weeks, sooner if needed  with all medications /inhalers/ solutions in hand so we can verify exactly what you are taking. This includes all medications from all doctors and over the counters - needs feno on return.    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 Follow-up    Cough has improved some. She occ will produce some sputum but she is unsure of color.  She has been having some HA and fatigue that she relates to her allergies.   Dyspnea:  Not limited by sob  Cough: supper time  And after  Sleep: not waking up  SABA use:  None  rec Go ahead and take the prednisone Symbicort 80 Take 2 puffs first thing in am and then another 2 puffs about 12 hours later.  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 Follow-up    She tried the symbicort for 1 wk and her cough got worse so she stopped. She states she went to the beach recently and she did not cough as much.   Dyspnea:  Felt fine during the walk x 27 min flat while at the beach    Cough: after supper and at bedtime min mucoid sputum  symbicort made the cough worse  Prednisone may have helped  rec Please remember to go to the lab department downstairs in the basement  for your tests - we will call you with the results when they are available. GERD  Diet  For drainage / throat tickle try take CHLORPHENIRAMINE  4 mg - take one every 4 hours as needed -   MCT neg 03/17/17     04/01/2018  f/u ov/Susan Carpenter re: cough since 2016 worse x 2 weeks - had improved to point where only cough q 3-4 days  Chief Complaint  Patient presents with  . Acute Visit    Increased cough since 03/16/18- she feels like sputum comes up but she does not cough it out.   Dyspnea:  Mostly related to cough  Cough: severe but never spits any out ever - always swallow, never able to expectorate Feels fine when wakes up then worse as day goes on  gen chest and upper abd discomfort brought on by coughing fits on rec The key to effective treatment for your cough is eliminating the non-stop cycle of cough you're stuck in long enough to let your airway  heal completely and then see if there is anything still making you cough once you stop the cough suppression, but this should take no more than 5 days to figure out Any time you feel the urge to cough, do so into the flutter valve to prevent airway trauma  First take mucinex dm 1200  every 12 hours and supplement if needed with  tramadol 50 mg up to 2 every 4 hours Prednisone 10 mg take  4 each am x 2 days,   2 each am x 2 days,  1 each am x 2 days and stop (this is to eliminate allergies and inflammation from coughing) GERD diet Calcium should be gluconate, no  carbonate form of calcium  Please schedule a follow up office visit in 6 weeks, call sooner if needed with all active medications in hand including over the counters       05/15/2018  f/u ov/Susan Carpenter re: cough x 2016   Chief Complaint  Patient presents with  . Follow-up    Cough has improved some, but still present. She still has some green to brown sputum.    Dyspnea:  MMRC1 = can walk nl pace, flat grade, can't hurry or go uphills or steps s sob   Cough: worse mid morning now >>>  A tsp of thick mucus slt green, levaquin did not help (rx 04/07/18 ) not using mucinex dm or flutter as instructed  Sleeping: on either side with one pillow s noct cough  SABA use: none 02: none  Saw allergy 05/14/18 > neg skin testing x for dust >>   pnds improved with atrovent NS rec For cough   >  mucinex dm 1200 mg every 12 hours and use the flutter valve as much as you can    schedule HRCT of chest >  Neg bronchiectasis/  Please schedule a follow up office visit in 4  weeks, call sooner if needed with all medications /inhalers/ solutions in hand     06/23/2018  f/u ov/Susan Carpenter re: cough x 2016 / did not bring flutter  Chief Complaint  Patient presents with  . Follow-up    4wk f/u for cough. Per patient, cough is getting better. She has a mild ache in the middle of chest.   Dyspnea:  Occurs spontaneously at rest, resolves after a few breaths or a few min, not reproduced with ex  Cough: was doing better until "cough virus"  Mother's day but improving back to baseline now  Sleeping: better on side, no coughing fits at night  SABA use: none  02: none  New midline cp typically in am with sense of "chest congestion"   Not with exertion  but not very active  Always does better on prednisone    No obvious day to day or daytime variability or assoc excess/ purulent sputum or mucus plugs or hemoptysis or cp or chest tightness, subjective wheeze or overt sinus or hb symptoms.   Sleeping as abov e without nocturnal  or early am exacerbation  of respiratory  c/o's or need for noct saba. Also denies any obvious fluctuation of symptoms with weather or environmental changes or other aggravating or alleviating factors except as outlined above   No unusual exposure hx or h/o childhood pna/ asthma or knowledge of premature birth.  Current Allergies, Complete Past Medical History, Past Surgical History, Family History, and Social History were reviewed in [facility] electronic medical record.  ROS  The following are not active complaints unless bolded  Hoarseness, sore throat, dysphagia, dental problems, itching, sneezing,  nasal congestion or discharge of excess mucus or purulent secretions, ear ache,   fever, chills, sweats, unintended wt loss or wt gain, classically pleuritic or exertional cp,  orthopnea pnd or arm/hand swelling  or leg swelling, presyncope, palpitations, abdominal pain, anorexia, nausea, vomiting, diarrhea  or change in bowel habits or change in bladder habits, change in stools or change in urine, dysuria, hematuria,  rash, arthralgias, visual complaints, headache, numbness, weakness or ataxia or problems with walking or coordination,  change in mood or  memory.        Current Meds  Medication Sig  . chlorpheniramine (CHLOR-TRIMETON) 4 MG tablet Take 4 mg by mouth every 4 (four) hours as needed for allergies.  . cycloSPORINE (RESTASIS) 0.05 % ophthalmic emulsion Apply to eye.  . diclofenac sodium (VOLTAREN) 1 % GEL Voltaren Gel 3 grams to 3 large joints upto TID 3 TUBES with 3 refills  . EPIPEN 2-PAK 0.3 MG/0.3ML SOAJ injection use as directed for INJECTION  . esomeprazole (NEXIUM) 40 MG capsule TAKE 1 CAPSULE BY  MOUTH TWICE DAILY BEFORE A MEAL  . finasteride (PROSCAR) 5 MG tablet Take 2.5 mg by mouth daily.  Marland Kitchen. ipratropium (ATROVENT) 0.03 % nasal spray Place 1-2 sprays into the nose 3 (three) times daily as needed.  . naproxen sodium (ANAPROX) 220 MG tablet Take by mouth.  . Respiratory Therapy Supplies (FLUTTER) DEVI Use as directed  . sertraline (ZOLOFT) 50 MG tablet   . TURMERIC PO Herbal Name: Tumeric                            Objective:   Physical Exam  amb wf freq throat clearing   06/23/2018        188  05/15/2018        191  04/01/2018        187  02/27/2018        190  01/30/2018        193   12/20/17 199 lb 3.2 oz (90.4 kg)  04/17/17 201 lb (91.2 kg)  01/15/17 201 lb (91.2 kg)     Vital signs reviewed - Note on arrival 02 sats  100% on RA       HEENT: nl dentition, turbinates bilaterally, and oropharynx with min cobblestoning/ no excess pnd. Nl external ear canals without cough reflex   NECK :  without JVD/Nodes/TM/ nl carotid upstrokes bilaterally   LUNGS: no acc muscle use,  Nl contour chest which is clear to A and P bilaterally with  cough on  Forced exp maneuvers   CV:  RRR  no s3 or murmur or increase in P2, and no edema   ABD:  soft and nontender with nl inspiratory excursion in the supine position. No bruits or organomegaly appreciated, bowel sounds nl  MS:  Nl gait/ ext warm without deformities, calf tenderness, cyanosis or clubbing No obvious joint restrictions   SKIN: warm and dry without lesions    NEURO:  alert, approp, nl sensorium with  no motor or cerebellar deficits apparent.          I personally reviewed images and agree with radiology impression as follows:   Chest HRCT  05/28/18  1. No evidence of interstitial lung disease. 2. Mild patchy air trapping in both lungs, indicative of small airways disease. 3. Two small solid right pulmonary nodules, largest 3 mm. No follow-up needed if patient  is low-risk (and has no known  or suspected primary neoplasm). Non-contrast chest CT can be considered in 12 months if patient is high-risk. This recommendation follows the consensus statement: Guidelines for Management of Incidental Pulmonary Nodules Detected on CT Images:From the Fleischner Society 2017; published online before print (10.1148/radiol.[id number]). 4. Three-vessel coronary atherosclerosis. 5. Small to moderate hiatal hernia.         Assessment:

## 2018-06-23 NOTE — Patient Instructions (Addendum)
Gabapentin 100 mg three times a day   Prednisone 10 mg take  4 each am x 2 days,   2 each am x 2 days,  1 each am x 2 days and stop   Always cough and clear your throat into the flutter valve to prevent throat clearing  Ok to stop the mucinex dm   Chlortrimeton 4 mg up to 2 every 4 hours as needed for drainage   Please schedule a follow up office visit in 6 weeks, call sooner if needed Add : needs feno to r/o eos bronchitis

## 2018-06-24 ENCOUNTER — Encounter: Payer: Self-pay | Admitting: Internal Medicine

## 2018-06-24 NOTE — Assessment & Plan Note (Addendum)
Onset 2016  singulair failed 2017-18 Spirometry 12/20/2017  wnl x for effort dep portion of f/v loop  - max gerd rx plus 1st gen H1 blockers per guidelines  12/20/2017 >>>did not use latter - CT sinus 01/01/2018  Ok  - FENO 01/30/2018  =   13  - 01/30/2018  After extensive coaching inhaler device  effectiveness =    75% try symbicort 80 2bid > cough so stopped - Allergy profile 02/27/2018 >  Eos 0.3 /  IgE 74  RAST Dust > ragweed  - 03/07/17 MCT neg  - Cylcical cough rex 04/01/2018  Saw allergy 05/14/18 > neg skin testing x for dust >>   pnds improved with atrovent NS - HRCT 05/28/2018 >>>   Neg ILD/ mod HH   - 06/23/2018 trial of gabapentin 100 tid    Lack of cough resolution on a verified empirical regimen could mean an alternative diagnosis = irritable larynx syndrome) , persistence of the disease state (eg sinusitis or bronchiectasis which we have ruled out now) , or inadequacy of currently available therapy (eg no medical rx available for non-acid gerd - always a concern in chronic coughers where the cough can cause non-acid reflux which can perpetuate the cough via a cyclical pattern)   rec  Trial of gabapentin 100 tid along with one more prednisone course (it always works) and use more 1st gen H1 blockers per guidelines if tol daytime and if not better up titrate up to 300 mg tid [facility]   Discussed in detail all the  indications, usual  risks and alternatives  relative to the benefits with patient who agrees to proceed with rx as outlined   I had an extended discussion with the patient reviewing all relevant studies completed to date and  lasting 15 to 20 minutes of a 25 minute visit    Each maintenance medication was reviewed in detail including most importantly the difference between maintenance and prns and under what circumstances the prns are to be triggered using an action plan format that is not reflected in the computer generated alphabetically organized AVS.    Please  see AVS for specific instructions unique to this visit that I personally wrote and verbalized to the the pt in detail and then reviewed with pt  by my nurse highlighting any  changes in therapy recommended at today's visit to their plan of care.

## 2018-08-04 ENCOUNTER — Encounter: Payer: Self-pay | Admitting: Internal Medicine

## 2018-08-04 ENCOUNTER — Ambulatory Visit: Payer: Medicare Other | Admitting: Internal Medicine

## 2018-08-04 VITALS — BP 120/84 | HR 74 | Ht 64.0 in | Wt 191.0 lb

## 2018-08-04 DIAGNOSIS — R05 Cough: Secondary | ICD-10-CM | POA: Diagnosis not present

## 2018-08-04 DIAGNOSIS — R053 Chronic cough: Secondary | ICD-10-CM

## 2018-08-04 DIAGNOSIS — Z23 Encounter for immunization: Secondary | ICD-10-CM

## 2018-08-04 MED ORDER — GABAPENTIN 100 MG PO CAPS: 100.0000 mg | ORAL_CAPSULE | Freq: Four times a day (QID) | ORAL | 2 refills | Status: DC

## 2018-08-04 NOTE — Progress Notes (Signed)
Subjective:     Patient ID: Susan Carpenter, female   DOB: 07/19/1946,   MRN: [id number]     Brief patient profile:  [id number] yowf  never smoker with "allergies all her life"manifested by ? HA/some runny nose and pos w/u by Dr Irena CordsVan Winkle around 2014 eval Pos to trees/ pollen/ mold/ dust and shots x 2 years but  Had severe skin reactions to shots so they were stopped with no  Difference in symptoms per pt on vs off the shots  and varies zyrtec vs clariton and occ benadryl to control the symptoms  Then developed  insidious onset chronic cough x around 2016 so referred to pulmonary clinic 12/20/2017 by Dr Zachery DauerBarnes   History of Present Illness  12/20/2017 1st [facility] office visit/ Bowyn Mercier   Chief Complaint  Patient presents with  . Pulmonary Consult    Referred by Dr. Juluis RainierElizabeth Barnes. Pt c/o cough for 3 yrs. She states it's usually worse in the Winter, but "it's bad in the summer too". She states that the cough starts in the evening and will occ wake her up in the night. She has quite a bit of mucus in the mornings- unsure of color.  She also has occ chest tightness and the urge to clear her throat often.   onset was insidious and pattern variable = it can resolve for a few days at most then recurs and sometimes coughs up as much as a sev tsp esp in am's  Kouffman Reflux v Neurogenic Cough Differentiator Reflux Comments  Do you awaken from a sound sleep coughing violently?                            With trouble breathing? Not violently but sporadic   Do you have choking episodes when you cannot  Get enough air, gasping for air ?              no   Do you usually cough when you lie down into  The bed, or when you just lie down to rest ?                          Sporadic/ uses cough drops   Do you usually cough after meals or eating?         Yes   Do you cough when (or after) you bend over?    Yes   GERD SCORE     Kouffman Reflux v Neurogenic Cough Differentiator Neurogenic   Do you more-or-less  cough all day long? sporadic   Does change of temperature make you cough? no   Does laughing or chuckling cause you to cough? If lots of laugh   Do fumes (perfume, automobile fumes, burned  Toast, etc.,) cause you to cough ?      no   Does speaking, singing, or talking on the phone cause you to cough   ?               No    Neurogenic/Airway score      On nexium 20 mg 30 min before bfast and on multiple high dose oil based supplements  Cough to vomit it past  gen chest discomfort only during coughing fits  On plaquenil for alopecia  Not limited by breathing from desired activities  rec Change nexium to 40 mg Take 30- 60 min before your first and last meals  of the day  For drainage / throat tickle try take CHLORPHENIRAMINE  4 mg - take one every 4 hours as needed - GERD   Please see patient coordinator before you leave today  to schedule sinus ct If not improving >>> Prednisone 10 mg take  4 each am x 2 days,   2 each am x 2 days,  1 each am x 2 days and stop  Please schedule a follow up office visit in 6 weeks, sooner if needed  with all medications /inhalers/ solutions in hand so we can verify exactly what you are taking. This includes all medications from all doctors and over the counters - needs feno on return.    01/30/2018  f/u ov/Shannin Naab re:  uacs vs cough variant asthma / poor cough control since 2016/ did not bring all meds Chief Complaint  Patient presents with  . Follow-up    Cough has improved some. She occ will produce some sputum but she is unsure of color.  She has been having some HA and fatigue that she relates to her allergies.   Dyspnea:  Not limited by sob  Cough: supper time  And after  Sleep: not waking up  SABA use:  None  rec Go ahead and take the prednisone Symbicort 80 Take 2 puffs first thing in am and then another 2 puffs about 12 hours later.  Work on inhaler technique:    Stop the clariton and zyrtec and try  CHLORPHENIRAMINE  4 mg - take one - two every  4 hours as needed - available over the counter- may cause drowsiness so start with just a bedtime dose or two and see how you tolerate it before trying in daytime      02/27/2018  f/u ov/Dava Rensch re: uacs vs cough variant asthma/ poor control since 2016/brought meds but not clear she ever tried h1 as rec last ov  Chief Complaint  Patient presents with  . Follow-up    She tried the symbicort for 1 wk and her cough got worse so she stopped. She states she went to the beach recently and she did not cough as much.   Dyspnea:  Felt fine during the walk x 27 min flat while at the beach    Cough: after supper and at bedtime min mucoid sputum  symbicort made the cough worse  Prednisone may have helped  rec Please remember to go to the lab department downstairs in the basement  for your tests - we will call you with the results when they are available. GERD  Diet  For drainage / throat tickle try take CHLORPHENIRAMINE  4 mg - take one every 4 hours as needed -   MCT neg 03/17/17     04/01/2018  f/u ov/Travius Crochet re: cough since 2016 worse x 2 weeks - had improved to point where only cough q 3-4 days  Chief Complaint  Patient presents with  . Acute Visit    Increased cough since 03/16/18- she feels like sputum comes up but she does not cough it out.   Dyspnea:  Mostly related to cough  Cough: severe but never spits any out ever - always swallow, never able to expectorate Feels fine when wakes up then worse as day goes on  gen chest and upper abd discomfort brought on by coughing fits on rec The key to effective treatment for your cough is eliminating the non-stop cycle of cough you're stuck in long enough to let your airway  heal completely and then see if there is anything still making you cough once you stop the cough suppression, but this should take no more than 5 days to figure out Any time you feel the urge to cough, do so into the flutter valve to prevent airway trauma  First take mucinex dm 1200  every 12 hours and supplement if needed with  tramadol 50 mg up to 2 every 4 hours Prednisone 10 mg take  4 each am x 2 days,   2 each am x 2 days,  1 each am x 2 days and stop (this is to eliminate allergies and inflammation from coughing) GERD diet Calcium should be gluconate, no  carbonate form of calcium  Please schedule a follow up office visit in 6 weeks, call sooner if needed with all active medications in hand including over the counters       05/15/2018  f/u ov/Kamen Hanken re: cough x 2016   Chief Complaint  Patient presents with  . Follow-up    Cough has improved some, but still present. She still has some green to brown sputum.    Dyspnea:  MMRC1 = can walk nl pace, flat grade, can't hurry or go uphills or steps s sob   Cough: worse mid morning now >>>  A tsp of thick mucus slt green, levaquin did not help (rx 04/07/18 ) not using mucinex dm or flutter as instructed  Sleeping: on either side with one pillow s noct cough  SABA use: none 02: none  Saw allergy 05/14/18 > neg skin testing x for dust >>   pnds improved with atrovent NS rec For cough   >  mucinex dm 1200 mg every 12 hours and use the flutter valve as much as you can    schedule HRCT of chest >  Neg bronchiectasis/  Please schedule a follow up office visit in 4  weeks, call sooner if needed with all medications /inhalers/ solutions in hand     06/23/2018  f/u ov/Juanpablo Ciresi re: cough x 2016 / did not bring flutter  Chief Complaint  Patient presents with  . Follow-up    4wk f/u for cough. Per patient, cough is getting better. She has a mild ache in the middle of chest.   Dyspnea:  Occurs spontaneously at rest, resolves after a few breaths or a few min, not reproduced with ex  Cough: was doing better until "cough virus"  Mother's day but improving back to baseline now  Sleeping: better on side, no coughing fits at night  SABA use: none  02: none New midline cp typically in am with sense of "chest congestion"   Not with exertion  but not very active Always does better on prednisone  rec Gabapentin 100 mg three times a day  Prednisone 10 mg take  4 each am x 2 days,   2 each am x 2 days,  1 each am x 2 days and stop  Always cough and clear your throat into the flutter valve to prevent throat clearing Ok to stop the mucinex dm  Chlortrimeton 4 mg up to 2 every 4 hours as needed for drainage  Please schedule a follow up office visit in 6 weeks, call sooner if needed      08/04/2018  f/u ov/Jaxxen Voong re: cough 2016  /   Throat sensation is still there even on prednisone but cough much better on gabapentin 100 sometimes mises doses Chief Complaint  Patient presents with  . Follow-up  She is coughing less. She does clear her throat some.   Dyspnea:  Not limited by breathing from desired activities   Cough: resolved despite not taking gabapentin consistenly at 100 tid Sleeping: fine p 1st gen H1 blockers per guidelines  At hs SABA use: none 02: none     No obvious day to day or daytime variability or assoc excess/ purulent sputum or mucus plugs or hemoptysis or cp or chest tightness, subjective wheeze or overt sinus or hb symptoms.   slpeeping as above without nocturnal  or early am exacerbation  of respiratory  c/o's or need for noct saba. Also denies any obvious fluctuation of symptoms with weather or environmental changes or other aggravating or alleviating factors except as outlined above   No unusual exposure hx or h/o childhood pna/ asthma or knowledge of premature birth.  Current Allergies, Complete Past Medical History, Past Surgical History, Family History, and Social History were reviewed in [employer] record.  ROS  The following are not active complaints unless bolded Hoarseness, sore throat, dysphagia, dental problems, itching, sneezing,  nasal congestion or discharge of excess mucus or purulent secretions, ear ache,   fever, chills, sweats, unintended wt loss or wt gain, classically  pleuritic or exertional cp,  orthopnea pnd or arm/hand swelling  or leg swelling, presyncope, palpitations, abdominal pain, anorexia, nausea, vomiting, diarrhea  or change in bowel habits or change in bladder habits, change in stools or change in urine, dysuria, hematuria,  rash, arthralgias, visual complaints, headache, numbness, weakness or ataxia or problems with walking or coordination,  change in mood or  memory.        Current Meds  Medication Sig  . chlorpheniramine (CHLOR-TRIMETON) 4 MG tablet Take 4 mg by mouth every 4 (four) hours as needed for allergies.  . cycloSPORINE (RESTASIS) 0.05 % ophthalmic emulsion Apply to eye.  . diclofenac sodium (VOLTAREN) 1 % GEL Voltaren Gel 3 grams to 3 large joints upto TID 3 TUBES with 3 refills  . EPIPEN 2-PAK 0.3 MG/0.3ML SOAJ injection use as directed for INJECTION  . esomeprazole (NEXIUM) 40 MG capsule TAKE 1 CAPSULE BY MOUTH TWICE DAILY BEFORE A MEAL  . finasteride (PROSCAR) 5 MG tablet Take 2.5 mg by mouth daily.  Marland Kitchen ipratropium (ATROVENT) 0.03 % nasal spray Place 1-2 sprays into the nose 3 (three) times daily as needed.  . naproxen sodium (ANAPROX) 220 MG tablet Take by mouth.  . Respiratory Therapy Supplies (FLUTTER) DEVI Use as directed  . sertraline (ZOLOFT) 50 MG tablet   . TURMERIC PO Herbal Name: Tumeric  . [  gabapentin (NEURONTIN) 100 MG capsule Take 1 capsule (100 mg total) by mouth 3 (three) times daily. One three times daily  .                           Objective:   Physical Exam  amb wf less freq throat clearing   08/04/2018        191  06/23/2018        188  05/15/2018        191  04/01/2018        187  02/27/2018        190  01/30/2018        193   12/20/17 199 lb 3.2 oz (90.4 kg)  04/17/17 201 lb (91.2 kg)  01/15/17 201 lb (91.2 kg)       Vital signs reviewed -  Note on arrival 02 sats  96% on RA     HEENT: nl dentition, turbinates bilaterally, and oropharynx. Nl external ear canals without cough  reflex   NECK :  without JVD/Nodes/TM/ nl carotid upstrokes bilaterally   LUNGS: no acc muscle use,  Nl contour chest  With cough on  Forced exp maneuvers with pseudowheeze/ absent with plm   CV:  RRR  no s3 or murmur or increase in P2, and no edema   ABD:  soft and nontender with nl inspiratory excursion in the supine position. No bruits or organomegaly appreciated, bowel sounds nl  MS:  Nl gait/ ext warm without deformities, calf tenderness, cyanosis or clubbing No obvious joint restrictions   SKIN: warm and dry without lesions    NEURO:  alert, approp, nl sensorium with  no motor or cerebellar deficits apparent.                  Assessment:

## 2018-08-04 NOTE — Patient Instructions (Signed)
Gabapentin 100 mg up to 4 times if tolerated   Please schedule a follow up visit in 3 months but call sooner if needed

## 2018-08-05 ENCOUNTER — Other Ambulatory Visit: Payer: Self-pay | Admitting: Internal Medicine

## 2018-08-06 ENCOUNTER — Encounter: Payer: Self-pay | Admitting: Internal Medicine

## 2018-08-06 NOTE — Assessment & Plan Note (Signed)
Onset 2016  singulair failed 2017-18 Spirometry 12/20/2017  wnl x for effort dep portion of f/v loop  - max gerd rx plus 1st gen H1 blockers per guidelines  12/20/2017 >>>did not use latter - CT sinus 01/01/2018  Ok  - FENO 01/30/2018  =   13  - 01/30/2018  After extensive coaching inhaler device  effectiveness =    75% try symbicort 80 2bid > cough so stopped - Allergy profile 02/27/2018 >  Eos 0.3 /  IgE 74  RAST Dust > ragweed  - 03/07/17 MCT neg  - Cylcical cough rex 04/01/2018  Saw allergy 05/14/18 > neg skin testing x for dust >>   pnds improved with atrovent NS - HRCT 05/28/2018 >>>   Neg ILD/ mod HH  - 06/23/2018 trial of gabapentin 100 tid improved but still globus sensation so rec 100 qid    Says gabapentin best rx to date but forgets some doses so advise to take bfast, lunch, supper, bedtime and if misses dose just take an extra at next meal/ hs as still has globus sensation   No other changes for now   Each maintenance medication was reviewed in detail including most importantly the difference between maintenance and as needed and under what circumstances the prns are to be used.  Please see AVS for specific  Instructions which are unique to this visit and I personally typed out  which were reviewed in detail in writing with the patient and a copy provided.

## 2018-09-17 NOTE — Progress Notes (Signed)
Office Visit Note  Patient: Susan Carpenter             Date of Birth: 1946/07/15           MRN: [id number]             PCP: Juluis Rainier, MD Referring: Juluis Rainier, MD Visit Date: 10/01/2018 Occupation: @GUAROCC @  Subjective:  Feet pain.   History of Present Illness: Susan Carpenter is a 72 y.o. female history of osteoarthritis and fibromyalgia.  She states she has been having increased pain and discomfort in her bilateral feet.  She also has pain in her heel which causes discomfort.  She has modified her shoe was despite of that she has been hurting.  She also has been hurting in her left hip for the last 2 to 3 months.  She continues to have discomfort in her bilateral hands.  She had left shoulder joint rotator cuff tear repair last year.  She states she went for follow-up visit and she was advised total shoulder replacement.  She has significant morning stiffness.  She believes her fibromyalgia is flaring as well.  Activities of Daily Living:  Patient reports morning stiffness for 1 hour.   Patient Reports nocturnal pain.  Difficulty dressing/grooming: Denies Difficulty climbing stairs: Denies Difficulty getting out of chair: Denies Difficulty using hands for taps, buttons, cutlery, and/or writing: Denies  Review of Systems  Constitutional: Positive for fatigue. Negative for night sweats, weight gain and weight loss.  HENT: Negative for mouth sores, trouble swallowing, trouble swallowing, mouth dryness and nose dryness.   Eyes: Negative for pain, redness, visual disturbance and dryness.  Respiratory: Negative for cough, shortness of breath and difficulty breathing.   Cardiovascular: Negative for chest pain, palpitations, hypertension, irregular heartbeat and swelling in legs/feet.  Gastrointestinal: Negative for blood in stool, constipation and diarrhea.  Endocrine: Negative for increased urination.  Genitourinary: Negative for vaginal dryness.  Musculoskeletal:  Positive for arthralgias, joint pain, myalgias, morning stiffness and myalgias. Negative for joint swelling, muscle weakness and muscle tenderness.  Skin: Positive for hair loss. Negative for color change, rash, skin tightness, ulcers and sensitivity to sunlight.  Allergic/Immunologic: Negative for susceptible to infections.  Neurological: Negative for dizziness, memory loss, night sweats and weakness.  Hematological: Negative for swollen glands.  Psychiatric/Behavioral: Positive for depressed mood and sleep disturbance. The patient is not nervous/anxious.     PMFS History:  Patient Active Problem List   Diagnosis Date Noted  . Status post left rotator cuff repair 10/01/2018  . History of total knee replacement, right 10/01/2018  . DDD (degenerative disc disease), cervical 10/01/2018  . DDD (degenerative disc disease), lumbar 10/01/2018  . Cough variant asthma vs uacs 02/02/2018  . Chronic cough 12/20/2017  . Other insomnia 01/15/2017  . Other fatigue 01/15/2017  . Trapezius muscle spasm 01/15/2017  . 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 Primary osteoarthritis of both hands 09/19/2016  . OA (osteoarthritis) of knee 08/01/2015  . INTERSTITIAL CYSTITIS 01/31/2010  . Fibromyalgia 01/31/2010  . FATIGUE 01/31/2010  . DYSPNEA/SHORTNESS OF BREATH 01/31/2010  . FREQUENCY, URINARY 01/31/2010  . Depression 01/24/2009  . Migraines 09/04/2008  . HELICOBACTER PYLORI INFECTION 10/14/2007  . Allergic rhinitis 10/14/2007  . HIATAL HERNIA 10/14/2007  . ANA POSITIVE 10/14/2007  . Hypothyroidism 10/10/2007  . PAIN IN JOINT, SITE UNSPECIFIED 10/10/2007  . WEIGHT GAIN 10/10/2007    Past Medical History:  Diagnosis Date  . Allergy    rhinitis  . ANA positive   .  Arthritis   . Complication of anesthesia   . Constipation   . Fibromyalgia   . GERD (gastroesophageal reflux disease)   . Heart murmur   . Helicobacter pylori (H. pylori) infection    s/p treatment  . Hiatal hernia   . Interstitial  cystitis   . Pain in joint    site unspecified  . PONV (postoperative nausea and vomiting)   . Psoriasis   . Psoriatic arthritis (HCC)     Family History  Problem Relation Age of Onset  . Diabetes Father   . Heart disease Father        MI  . Alcohol abuse Father   . Cancer Mother        COLON   . Mental illness Mother        ALZHEIMERS; DEPRESSION  . Thyroid disease Mother        HYPOTHYROID  . Alcohol abuse Maternal Uncle   . Diabetes Maternal Uncle   . Hypertension Maternal Grandmother   . Stroke Maternal Grandmother    Past Surgical History:  Procedure Laterality Date  . BLADDER SURGERY     x2 for scar tissue  . CHOLECYSTECTOMY  1995  . cysts on ovaries    . polyp removal  1984   from fallopian tube  . ROTATOR CUFF REPAIR Left 06/2017  . THUMB SURGERY Right 01/16/2016  . TOTAL KNEE ARTHROPLASTY Right 08/01/2015   Procedure: RIGHT TOTAL KNEE ARTHROPLASTY;  Surgeon: Ollen Gross, MD;  Location: [facility];  Service: Orthopedics;  Laterality: Right;  . TUBAL LIGATION     Social History   Social History Narrative  . Not on file    Objective: Vital Signs: BP 131/84 (BP Location: Left Arm, Patient Position: Sitting, Cuff Size: Normal)   Pulse 93   Resp 14   Ht 5\' 4"  (1.626 m)   Wt 192 lb (87.1 kg)   BMI 32.96 kg/m    Physical Exam  Constitutional: She is oriented to person, place, and time. She appears well-developed and well-nourished.  HENT:  Head: Normocephalic and atraumatic.  Eyes: Conjunctivae and EOM are normal.  Neck: Normal range of motion.  Cardiovascular: Normal rate, regular rhythm, normal heart sounds and intact distal pulses.  Pulmonary/Chest: Effort normal and breath sounds normal.  Abdominal: Soft. Bowel sounds are normal.  Lymphadenopathy:    She has no cervical adenopathy.  Neurological: She is alert and oriented to person, place, and time.  Skin: Skin is warm and dry. Capillary refill takes less than 2 seconds.  Psychiatric: She has a  normal mood and affect. Her behavior is normal.  Nursing note and vitals reviewed.    Musculoskeletal Exam: C-spine thoracic lumbar spine limited range of motion.  She had painful limited abduction of her left shoulder.  Elbow joints were in good range of motion.  She has severe DIP and PIP thickening consistent with osteoarthritis.  She has discomfort range of motion of her left hip joint.  Knee joints were in good range of motion.  Her right knee joint is replaced.  She has discomfort on palpation of bilateral ankles and across her MTPs.  No synovitis was noted.  She has some generalized hyperalgesia.   CDAI Exam: CDAI Score: Not documented Patient Global Assessment: Not documented; Provider Global Assessment: Not documented Swollen: Not documented; Tender: Not documented Joint Exam   Not documented   There is currently no information documented on the homunculus. Go to the Rheumatology activity and complete the homunculus joint  exam.  Investigation: No additional findings.  Imaging: Xr Hip Unilat W Or W/o Pelvis 2-3 Views Left  Result Date: 10/01/2018 No significant hip joint narrowing was noted.  No chondrocalcinosis was noted.  No SI joint narrowing was noted. Impression: Unremarkable x-ray of the hip joint.  Xr Foot 2 Views Left  Result Date: 10/01/2018 First MTP, PIP and DIP narrowing was noted.  No erosive changes were noted.  No intertarsal or tibiotalar joint space narrowing was noted.  Mild dorsal spurring was noted.  A small calcaneal spur was noted. Impression: These findings are consistent with osteoarthritis of the foot.  Xr Foot 2 Views Right  Result Date: 10/01/2018 First MTP, PIP and DIP narrowing was noted.  No erosive changes were noted.  No intertarsal or tibiotalar joint space narrowing was noted.  Mild dorsal spurring was noted. Impression: These findings are consistent with osteoarthritis of the foot.   Recent Labs: Lab Results  Component Value Date    WBC 6.5 02/27/2018   HGB 13.7 02/27/2018   PLT 227.0 02/27/2018   NA 140 01/15/2017   K 4.6 01/15/2017   CL 106 01/15/2017   CO2 25 01/15/2017   GLUCOSE 96 01/15/2017   BUN 17 01/15/2017   CREATININE 0.82 01/15/2017   BILITOT 0.3 01/15/2017   ALKPHOS 82 01/15/2017   AST 22 01/15/2017   ALT 22 01/15/2017   PROT 6.4 01/15/2017   ALBUMIN 4.0 01/15/2017   CALCIUM 9.2 01/15/2017   GFRAA 84 01/15/2017    Speciality Comments: No specialty comments available.  Procedures:  Foot Inj Date/Time: 10/01/2018 10:40 AM Performed by: Pollyann Savoy, MD Authorized by: Pollyann Savoy, MD   Consent Given by:  Patient Site marked: the procedure site was marked   Timeout: prior to procedure the correct patient, procedure, and site was verified   Indications:  Fasciitis Condition: Plantar Fasciitis   Location: right plantar fascia muscle   Prep: patient was prepped and draped in usual sterile fashion   Needle Size:  27 G Approach: Plantar. Medications:  0.5 mL lidocaine 1 %; 20 mg triamcinolone acetonide 40 MG/ML Patient Tolerance:  Patient tolerated the procedure well with no immediate complications   Allergies: Cephalexin; Oyster shell; Penicillins; Shellfish allergy; Codeine; Doxycycline; and Erythromycin   Assessment / Plan:     Visit Diagnoses: Fibromyalgia-she continues to have some generalized pain and discomfort.  Other fatigue-related to insomnia and fibromyalgia.  Other insomnia-she has underlying depression which causes insomnia.  Primary osteoarthritis of both hands-she has severe osteoarthritis in her bilateral hands which causes discomfort.  Joint protection was discussed.  Status post left rotator cuff repair-patient has been seen by orthopedics.  She states she was advised total shoulder replacement.  Pain in left hip -she has been experiencing pain in her left hip.  Plan: XR HIP UNILAT W OR W/O PELVIS 2-3 VIEWS LEFT.  The x-ray of the hip joint was unremarkable.   I believe the pain could be referred from her knee joint.  Doubt on hip exercises was given.  Primary osteoarthritis of both knees-she had right total knee replacement which appears to be doing well.  She does not have much discomfort in her left knee.  History of total knee replacement, right - Dr. Despina Hick  Pain in both feet -she has been experiencing a lot of pain and discomfort in her bilateral ankles and bilateral feet.  No synovitis was noted.  Plan: XR Foot 2 Views Right, XR Foot 2 Views Left.  The x-ray of  feet revealed osteoarthritic changes.  Plantar fasciitis of right foot-patient is having increased pain and difficulty walking.  Per her request right plantar fascia was injected with cortisone as described above.  She tolerated the procedure well.  Dr. plantar fascia exercises was given.  DDD (degenerative disc disease), cervical-she is chronic discomfort in her cervical region due to disc disease.  DDD (degenerative disc disease), lumbar-chronic pain  Other medical problems are listed as follows:  History of hypothyroidism  History of gastroesophageal reflux (GERD)  History of depression  Interstitial cystitis    Orders: Orders Placed This Encounter  Procedures  . XR HIP UNILAT W OR W/O PELVIS 2-3 VIEWS LEFT  . XR Foot 2 Views Right  . XR Foot 2 Views Left   Meds ordered this encounter  Medications  . diclofenac sodium (VOLTAREN) 1 % GEL    Sig: Voltaren Gel 3 grams to 3 large joints upto TID 3 TUBES with 3 refills    Dispense:  3 Tube    Refill:  3    Voltaren Gel 3 grams to 3 large joints upto TID 3 TUBES with 3 refills  . tiZANidine (ZANAFLEX) 4 MG tablet    Sig: Take 1 tablet (4 mg total) by mouth at bedtime.    Dispense:  30 tablet    Refill:  2  .  Follow-Up Instructions: Return in about 6 months (around 04/01/2019) for FMS, OA.   Pollyann SavoyShaili Sequoia Mincey, MD  Note - This record has been created using [employer] software.  Chart creation errors have been  sought, but may not always  have been located. Such creation errors do not reflect on  the standard of medical care.

## 2018-09-24 ENCOUNTER — Other Ambulatory Visit: Payer: Self-pay | Admitting: Internal Medicine

## 2018-10-01 ENCOUNTER — Ambulatory Visit: Payer: Medicare Other | Admitting: Rheumatology

## 2018-10-01 ENCOUNTER — Encounter: Payer: Self-pay | Admitting: Rheumatology

## 2018-10-01 ENCOUNTER — Ambulatory Visit (INDEPENDENT_AMBULATORY_CARE_PROVIDER_SITE_OTHER): Payer: Self-pay

## 2018-10-01 VITALS — BP 131/84 | HR 93 | Resp 14 | Ht 64.0 in | Wt 192.0 lb

## 2018-10-01 DIAGNOSIS — Z96651 Presence of right artificial knee joint: Secondary | ICD-10-CM | POA: Insufficient documentation

## 2018-10-01 DIAGNOSIS — M797 Fibromyalgia: Secondary | ICD-10-CM | POA: Diagnosis not present

## 2018-10-01 DIAGNOSIS — G4709 Other insomnia: Secondary | ICD-10-CM | POA: Diagnosis not present

## 2018-10-01 DIAGNOSIS — R5383 Other fatigue: Secondary | ICD-10-CM

## 2018-10-01 DIAGNOSIS — M25552 Pain in left hip: Secondary | ICD-10-CM | POA: Diagnosis not present

## 2018-10-01 DIAGNOSIS — M19042 Primary osteoarthritis, left hand: Secondary | ICD-10-CM

## 2018-10-01 DIAGNOSIS — Z8719 Personal history of other diseases of the digestive system: Secondary | ICD-10-CM

## 2018-10-01 DIAGNOSIS — M722 Plantar fascial fibromatosis: Secondary | ICD-10-CM

## 2018-10-01 DIAGNOSIS — M5136 Other intervertebral disc degeneration, lumbar region: Secondary | ICD-10-CM | POA: Insufficient documentation

## 2018-10-01 DIAGNOSIS — M79672 Pain in left foot: Secondary | ICD-10-CM

## 2018-10-01 DIAGNOSIS — Z8659 Personal history of other mental and behavioral disorders: Secondary | ICD-10-CM

## 2018-10-01 DIAGNOSIS — M170 Bilateral primary osteoarthritis of knee: Secondary | ICD-10-CM

## 2018-10-01 DIAGNOSIS — M5030 Other cervical disc degeneration, unspecified cervical region: Secondary | ICD-10-CM | POA: Insufficient documentation

## 2018-10-01 DIAGNOSIS — M19041 Primary osteoarthritis, right hand: Secondary | ICD-10-CM

## 2018-10-01 DIAGNOSIS — Z9889 Other specified postprocedural states: Secondary | ICD-10-CM | POA: Insufficient documentation

## 2018-10-01 DIAGNOSIS — N3010 Interstitial cystitis (chronic) without hematuria: Secondary | ICD-10-CM

## 2018-10-01 DIAGNOSIS — M79671 Pain in right foot: Secondary | ICD-10-CM

## 2018-10-01 DIAGNOSIS — Z8639 Personal history of other endocrine, nutritional and metabolic disease: Secondary | ICD-10-CM

## 2018-10-01 MED ORDER — DICLOFENAC SODIUM 1 % TD GEL: TRANSDERMAL | 3 refills | Status: DC

## 2018-10-01 MED ORDER — TIZANIDINE HCL 4 MG PO TABS: 4.0000 mg | ORAL_TABLET | Freq: Every day | ORAL | 2 refills | Status: AC

## 2018-10-01 MED ORDER — TRIAMCINOLONE ACETONIDE 40 MG/ML IJ SUSP
20.0000 mg | INTRAMUSCULAR | Status: AC | PRN
  Administered 2018-10-01: 20 mg

## 2018-10-01 MED ORDER — LIDOCAINE HCL 1 % IJ SOLN
0.5000 mL | INTRAMUSCULAR | Status: AC | PRN
  Administered 2018-10-01: .5 mL

## 2018-10-01 NOTE — Patient Instructions (Signed)
Plantar Fasciitis Rehab Ask your health care provider which exercises are safe for you. Do exercises exactly as told by your health care provider and adjust them as directed. It is normal to feel mild stretching, pulling, tightness, or discomfort as you do these exercises, but you should stop right away if you feel sudden pain or your pain gets worse. Do not begin these exercises until told by your health care provider. Stretching and range of motion exercises These exercises warm up your muscles and joints and improve the movement and flexibility of your foot. These exercises also help to relieve pain. Exercise A: Plantar fascia stretch  1. Sit with your left / right leg crossed over your opposite knee. 2. Hold your heel with one hand with that thumb near your arch. With your other hand, hold your toes and gently pull them back toward the top of your foot. You should feel a stretch on the bottom of your toes or your foot or both. 3. Hold this stretch for__________ seconds. 4. Slowly release your toes and return to the starting position. Repeat __________ times. Complete this exercise __________ times a day. Exercise B: Gastroc, standing  1. Stand with your hands against a wall. 2. Extend your left / right leg behind you, and bend your front knee slightly. 3. Keeping your heels on the floor and keeping your back knee straight, shift your weight toward the wall without arching your back. You should feel a gentle stretch in your left / right calf. 4. Hold this position for __________ seconds. Repeat __________ times. Complete this exercise __________ times a day. Exercise C: Soleus, standing 1. Stand with your hands against a wall. 2. Extend your left / right leg behind you, and bend your front knee slightly. 3. Keeping your heels on the floor, bend your back knee and slightly shift your weight over the back leg. You should feel a gentle stretch deep in your calf. 4. Hold this position for  __________ seconds. Repeat __________ times. Complete this exercise __________ times a day. Exercise D: Gastrocsoleus, standing 1. Stand with the ball of your left / right foot on a step. The ball of your foot is on the walking surface, right under your toes. 2. Keep your other foot firmly on the same step. 3. Hold onto the wall or a railing for balance. 4. Slowly lift your other foot, allowing your body weight to press your heel down over the edge of the step. You should feel a stretch in your left / right calf. 5. Hold this position for __________ seconds. 6. Return both feet to the step. 7. Repeat this exercise with a slight bend in your left / right knee. Repeat __________ times with your left / right knee straight and __________ times with your left / right knee bent. Complete this exercise __________ times a day. Balance exercise This exercise builds your balance and strength control of your arch to help take pressure off your plantar fascia. Exercise E: Single leg stand 1. Without shoes, stand near a railing or in a doorway. You may hold onto the railing or door frame as needed. 2. Stand on your left / right foot. Keep your big toe down on the floor and try to keep your arch lifted. Do not let your foot roll inward. 3. Hold this position for __________ seconds. 4. If this exercise is too easy, you can try it with your eyes closed or while standing on a pillow. Repeat __________ times. Complete  this exercise __________ times a day. This information is not intended to replace advice given to you by your health care provider. Make sure you discuss any questions you have with your health care provider. Document Released: 10/22/2005 Document Revised: 06/26/2016 Document Reviewed: 09/05/2015 Elsevier Interactive Patient Education  2018 [employer] Hip Exercises Ask your health care provider which exercises are safe for you. Do exercises exactly as told by your health care provider and  adjust them as directed. It is normal to feel mild stretching, pulling, tightness, or discomfort as you do these exercises, but you should stop right away if you feel sudden pain or your pain gets worse.Do not begin these exercises until told by your health care provider. STRETCHING AND RANGE OF MOTION EXERCISES These exercises warm up your muscles and joints and improve the movement and flexibility of your hip. These exercises also help to relieve pain, numbness, and tingling. Exercise A: Hamstrings, Supine  1. Lie on your back. 2. Loop a belt or towel over the ball of your left / rightfoot. The ball of your foot is on the walking surface, right under your toes. 3. Straighten your left / rightknee and slowly pull on the belt to raise your leg. ? Do not let your left / right knee bend while you do this. ? Keep your other leg flat on the floor. ? Raise the left / right leg until you feel a gentle stretch behind your left / right knee or thigh. 4. Hold this position for __________ seconds. 5. Slowly return your leg to the starting position. Repeat __________ times. Complete this stretch __________ times a day. Exercise B: Hip Rotators  1. Lie on your back on a firm surface. 2. Hold your left / right knee with your left / right hand. Hold your ankle with your other hand. 3. Gently pull your left / right knee and rotate your lower leg toward your other shoulder. ? Pull until you feel a stretch in your buttocks. ? Keep your hips and shoulders firmly planted while you do this stretch. 4. Hold this position for __________ seconds. Repeat __________ times. Complete this stretch __________ times a day. Exercise C: V-Sit (Hamstrings and Adductors)  1. Sit on the floor with your legs extended in a large "V" shape. Keep your knees straight during this exercise. 2. Start with your head and chest upright, then bend at your waist to reach for your left foot (position A). You should feel a stretch in  your right inner thigh. 3. Hold this position for __________ seconds. Then slowly return to the upright position. 4. Bend at your waist to reach forward (position B). You should feel a stretch behind both of your thighs and knees. 5. Hold this position for __________ seconds. Then slowly return to the upright position. 6. Bend at your waist to reach for your right foot (position C). You should feel a stretch in your left inner thigh. 7. Hold this position for __________ seconds. Then slowly return to the upright position. Repeat __________ times. Complete this stretch __________ times a day. Exercise D: Lunge (Hip Flexors)  1. Place your left / right knee on the floor and bend your other knee so that is directly over your ankle. You should be half-kneeling. 2. Keep good posture with your head over your shoulders. 3. Tighten your buttocks to point your tailbone downward. This helps your back to keep from arching too much. 4. You should feel a gentle stretch in the front  of your left / right thigh and hip. If you do not feel any resistance, slightly slide your other foot forward and then slowly lunge forward so your knee once again lines up over your ankle. 5. Make sure your tailbone continues to point downward. 6. Hold this position for __________ seconds. Repeat __________ times. Complete this stretch __________ times a day. STRENGTHENING EXERCISES These exercises build strength and endurance in your hip. Endurance is the ability to use your muscles for a long time, even after they get tired. Exercise E: Bridge (Hip Extensors)  1. Lie on your back on a firm surface with your knees bent and your feet flat on the floor. 2. Tighten your buttocks muscles and lift your bottom off the floor until the trunk of your body is level with your thighs. ? Do not arch your back. ? You should feel the muscles working in your buttocks and the back of your thighs. If you do not feel these muscles, slide your  feet 1-2 inches (2.5-5 cm) farther away from your buttocks. 3. Hold this position for __________ seconds. 4. Slowly lower your hips to the starting position. 5. Let your muscles relax completely between repetitions. 6. If this exercise is too easy, try doing it with your arms crossed over your chest. Repeat __________ times. Complete this exercise __________ times a day. Exercise F: Straight Leg Raises - Hip Abductors  1. Lie on your side with your left / right leg in the top position. Lie so your head, shoulder, knee, and hip line up with each other. You may bend your bottom knee to help you balance. 2. Roll your hips slightly forward, so your hips are stacked directly over each other and your left / right knee is facing forward. 3. Leading with your heel, lift your top leg 4-6 inches (10-15 cm). You should feel the muscles in your outer hip lifting. ? Do not let your foot drift forward. ? Do not let your knee roll toward the ceiling. 4. Hold this position for __________ seconds. 5. Slowly return to the starting position. 6. Let your muscles relax completely between repetitions. Repeat __________ times. Complete this exercise __________ times a day. Exercise G: Straight Leg Raises - Hip Adductors  1. Lie on your side with your left / right leg in the bottom position. Lie so your head, shoulder, knee, and hip line up. You may place your upper foot in front to help you balance. 2. Roll your hips slightly forward, so your hips are stacked directly over each other and your left / right knee is facing forward. 3. Tense the muscles in your inner thigh and lift your bottom leg 4-6 inches (10-15 cm). 4. Hold this position for __________ seconds. 5. Slowly return to the starting position. 6. Let your muscles relax completely between repetitions. Repeat __________ times. Complete this exercise __________ times a day. Exercise H: Straight Leg Raises - Quadriceps  1. Lie on your back with your left  / right leg extended and your other knee bent. 2. Tense the muscles in the front of your left / right thigh. When you do this, you should see your kneecap slide up or see increased dimpling just above your knee. 3. Tighten these muscles even more and raise your leg 4-6 inches (10-15 cm) off the floor. 4. Hold this position for __________ seconds. 5. Keep these muscles tense as you lower your leg. 6. Relax the muscles slowly and completely between repetitions. Repeat __________ times. Complete  this exercise __________ times a day. Exercise I: Hip Abductors, Standing 1. Tie one end of a rubber exercise band or tubing to a secure surface, such as a table or pole. 2. Loop the other end of the band or tubing around your left / right ankle. 3. Keeping your ankle with the band or tubing directly opposite of the secured end, step away until there is tension in the tubing or band. Hold onto a chair as needed for balance. 4. Lift your left / right leg out to your side. While you do this: ? Keep your back upright. ? Keep your shoulders over your hips. ? Keep your toes pointing forward. ? Make sure to use your hip muscles to lift your leg. Do not "throw" your leg or tip your body to lift your leg. 5. Hold this position for __________ seconds. 6. Slowly return to the starting position. Repeat __________ times. Complete this exercise __________ times a day. Exercise J: Squats (Quadriceps) 1. Stand in a door frame so your feet and knees are in line with the frame. You may place your hands on the frame for balance. 2. Slowly bend your knees and lower your hips like you are going to sit in a chair. ? Keep your lower legs in a straight-up-and-down position. ? Do not let your hips go lower than your knees. ? Do not bend your knees lower than told by your health care provider. ? If your hip pain increases, do not bend as low. 3. Hold this position for ___________ seconds. 4. Slowly push with your legs to  return to standing. Do not use your hands to pull yourself to standing. Repeat __________ times. Complete this exercise __________ times a day. This information is not intended to replace advice given to you by your health care provider. Make sure you discuss any questions you have with your health care provider. Document Released: 11/09/2005 Document Revised: 07/16/2016 Document Reviewed: 10/17/2015 Elsevier Interactive Patient Education  [employer].

## 2018-10-22 ENCOUNTER — Encounter: Payer: Self-pay | Admitting: Podiatry

## 2018-10-22 ENCOUNTER — Ambulatory Visit: Payer: Medicare Other | Admitting: Podiatry

## 2018-10-22 VITALS — BP 153/88 | HR 84

## 2018-10-22 DIAGNOSIS — M722 Plantar fascial fibromatosis: Secondary | ICD-10-CM | POA: Diagnosis not present

## 2018-10-22 MED ORDER — DICLOFENAC SODIUM 75 MG PO TBEC: 75.0000 mg | DELAYED_RELEASE_TABLET | Freq: Two times a day (BID) | ORAL | 1 refills | Status: DC

## 2018-10-22 MED ORDER — METHYLPREDNISOLONE 4 MG PO TBPK: ORAL_TABLET | ORAL | 0 refills | Status: DC

## 2018-10-24 NOTE — Progress Notes (Signed)
   Subjective: 72 year old female presenting today as a new patient with a chief complaint of bilateral heel pain, right worse than left, that has been ongoing for the past several years. 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 She states her PCP told her she had arthritis in her feet. She has not had any recent treatment for the symptoms. Standing and walking increases the pain. Patient is here for further evaluation and treatment.   Past Medical History:  Diagnosis Date  . Allergy    rhinitis  . ANA positive   . Arthritis   . Complication of anesthesia   . Constipation   . Fibromyalgia   . GERD (gastroesophageal reflux disease)   . Heart murmur   . Helicobacter pylori (H. pylori) infection    s/p treatment  . Hiatal hernia   . Interstitial cystitis   . Pain in joint    site unspecified  . PONV (postoperative nausea and vomiting)   . Psoriasis   . Psoriatic arthritis (HCC)      Objective: Physical Exam General: The patient is alert and oriented x3 in no acute distress.  Dermatology: Skin is warm, dry and supple bilateral lower extremities. Negative for open lesions or macerations bilateral.   Vascular: Dorsalis Pedis and Posterior Tibial pulses palpable bilateral.  Capillary fill time is immediate to all digits.  Neurological: Epicritic and protective threshold intact bilateral.   Musculoskeletal: Tenderness to palpation to the plantar aspect of the right heel along the plantar fascia. All other joints range of motion within normal limits bilateral. Strength 5/5 in all groups bilateral.   Assessment: 1. Plantar fasciitis right  Plan of Care:  1. Patient evaluated. X-rays in Epic from 10/01/18 reviewed.   2. Injection of 0.5cc Celestone soluspan injected into the right plantar fascia  3. Rx for Medrol Dose Pack placed 4. Rx for Diclofenac ordered for patient. 4. Plantar fascial band(s) dispensed 5. Instructed patient regarding  therapies and modalities at home to alleviate symptoms.  6. Begin using her topical Voltaren gel on her heel twice daily.  7. Patient is considering a new pair of custom orthotics.  8. Return to clinic in 4 weeks.    Retired Advice workerschool librarian.    Felecia ShellingBrent M. Willeen Novak, DPM [facility]  Dr. Felecia ShellingBrent M. Shacola Schussler, DPM    [address]                                        [place], [place] [address]                Office [phone]  Fax [phone]

## 2018-11-06 ENCOUNTER — Encounter: Payer: Self-pay | Admitting: Internal Medicine

## 2018-11-06 ENCOUNTER — Ambulatory Visit: Payer: Medicare Other | Admitting: Internal Medicine

## 2018-11-06 VITALS — BP 132/80 | HR 80 | Ht 64.0 in | Wt 190.8 lb

## 2018-11-06 DIAGNOSIS — J45991 Cough variant asthma: Secondary | ICD-10-CM

## 2018-11-06 DIAGNOSIS — R053 Chronic cough: Secondary | ICD-10-CM

## 2018-11-06 DIAGNOSIS — R05 Cough: Secondary | ICD-10-CM | POA: Diagnosis not present

## 2018-11-06 NOTE — Patient Instructions (Signed)
Try chlortrimeton 4 mg x 2 right at bedtime to see if you sleep better with less night time drainage   Pick zyrtec and take as a maintenance each am    Ok to try off gabapentin to see if helps   Keep the candy handy - no peppermint!   Please schedule a follow up visit in 3 months but call sooner if needed

## 2018-11-06 NOTE — Progress Notes (Signed)
Subjective:     Patient ID: Susan Carpenter, female   DOB: 06-25-1946,   MRN: [id number]     Brief patient profile:  88 yowf  never smoker with "allergies all her life"manifested by ? HA/some runny nose and pos w/u by Dr Irena Cords around 2014 eval Pos to trees/ pollen/ mold/ dust and shots x 2 years but  Had severe skin reactions to shots so they were stopped with no  Difference in symptoms per pt on vs off the shots  and varies zyrtec vs clariton and occ benadryl to control the symptoms  Then developed  insidious onset chronic cough x around 2016 so referred to pulmonary clinic 12/20/2017 by Dr Zachery Dauer   History of Present Illness  12/20/2017 1st [facility] office visit/ Marqual Mi   Chief Complaint  Patient presents with  . Pulmonary Consult    Referred by Dr. Juluis Rainier. Pt c/o cough for 3 yrs. She states it's usually worse in the Winter, but "it's bad in the summer too". She states that the cough starts in the evening and will occ wake her up in the night. She has quite a bit of mucus in the mornings- unsure of color.  She also has occ chest tightness and the urge to clear her throat often.   onset was insidious and pattern variable = it can resolve for a few days at most then recurs and sometimes coughs up as much as a sev tsp esp in am's  Kouffman Reflux v Neurogenic Cough Differentiator Reflux Comments  Do you awaken from a sound sleep coughing violently?                            With trouble breathing? Not violently but sporadic   Do you have choking episodes when you cannot  Get enough air, gasping for air ?              no   Do you usually cough when you lie down into  The bed, or when you just lie down to rest ?                          Sporadic/ uses cough drops   Do you usually cough after meals or eating?         Yes   Do you cough when (or after) you bend over?    Yes   GERD SCORE     Kouffman Reflux v Neurogenic Cough Differentiator Neurogenic   Do you more-or-less  cough all day long? sporadic   Does change of temperature make you cough? no   Does laughing or chuckling cause you to cough? If lots of laugh   Do fumes (perfume, automobile fumes, burned  Toast, etc.,) cause you to cough ?      no   Does speaking, singing, or talking on the phone cause you to cough   ?               No    Neurogenic/Airway score      On nexium 20 mg 30 min before bfast and on multiple high dose oil based supplements  Cough to vomit it past  gen chest discomfort only during coughing fits  On plaquenil for alopecia  Not limited by breathing from desired activities  rec Change nexium to 40 mg Take 30- 60 min before your first and last meals  of the day  For drainage / throat tickle try take CHLORPHENIRAMINE  4 mg - take one every 4 hours as needed - GERD   Please see patient coordinator before you leave today  to schedule sinus ct If not improving >>> Prednisone 10 mg take  4 each am x 2 days,   2 each am x 2 days,  1 each am x 2 days and stop  Please schedule a follow up office visit in 6 weeks, sooner if needed  with all medications /inhalers/ solutions in hand so we can verify exactly what you are taking. This includes all medications from all doctors and over the counters - needs feno on return.    01/30/2018  f/u ov/Ebbie Cherry re:  uacs vs cough variant asthma / poor cough control since 2016/ did not bring all meds Chief Complaint  Patient presents with  . Follow-up    Cough has improved some. She occ will produce some sputum but she is unsure of color.  She has been having some HA and fatigue that she relates to her allergies.   Dyspnea:  Not limited by sob  Cough: supper time  And after  Sleep: not waking up  SABA use:  None  rec Go ahead and take the prednisone Symbicort 80 Take 2 puffs first thing in am and then another 2 puffs about 12 hours later.  Work on inhaler technique:    Stop the clariton and zyrtec and try  CHLORPHENIRAMINE  4 mg - take one - two every  4 hours as needed - available over the counter- may cause drowsiness so start with just a bedtime dose or two and see how you tolerate it before trying in daytime      02/27/2018  f/u ov/Shakiyla Kook re: uacs vs cough variant asthma/ poor control since 2016/brought meds but not clear she ever tried h1 as rec last ov  Chief Complaint  Patient presents with  . Follow-up    She tried the symbicort for 1 wk and her cough got worse so she stopped. She states she went to the beach recently and she did not cough as much.   Dyspnea:  Felt fine during the walk x 27 min flat while at the beach    Cough: after supper and at bedtime min mucoid sputum  symbicort made the cough worse  Prednisone may have helped  rec Please remember to go to the lab department downstairs in the basement  for your tests - we will call you with the results when they are available. GERD  Diet  For drainage / throat tickle try take CHLORPHENIRAMINE  4 mg - take one every 4 hours as needed -   MCT neg 03/17/17     04/01/2018  f/u ov/Karsin Pesta re: cough since 2016 worse x 2 weeks - had improved to point where only cough q 3-4 days  Chief Complaint  Patient presents with  . Acute Visit    Increased cough since 03/16/18- she feels like sputum comes up but she does not cough it out.   Dyspnea:  Mostly related to cough  Cough: severe but never spits any out ever - always swallow, never able to expectorate Feels fine when wakes up then worse as day goes on  gen chest and upper abd discomfort brought on by coughing fits on rec The key to effective treatment for your cough is eliminating the non-stop cycle of cough you're stuck in long enough to let your airway  heal completely and then see if there is anything still making you cough once you stop the cough suppression, but this should take no more than 5 days to figure out Any time you feel the urge to cough, do so into the flutter valve to prevent airway trauma  First take mucinex dm 1200  every 12 hours and supplement if needed with  tramadol 50 mg up to 2 every 4 hours Prednisone 10 mg take  4 each am x 2 days,   2 each am x 2 days,  1 each am x 2 days and stop (this is to eliminate allergies and inflammation from coughing) GERD diet Calcium should be gluconate, no  carbonate form of calcium  Please schedule a follow up office visit in 6 weeks, call sooner if needed with all active medications in hand including over the counters       05/15/2018  f/u ov/Mateusz Neilan re: cough x 2016   Chief Complaint  Patient presents with  . Follow-up    Cough has improved some, but still present. She still has some green to brown sputum.    Dyspnea:  MMRC1 = can walk nl pace, flat grade, can't hurry or go uphills or steps s sob   Cough: worse mid morning now >>>  A tsp of thick mucus slt green, levaquin did not help (rx 04/07/18 ) not using mucinex dm or flutter as instructed  Sleeping: on either side with one pillow s noct cough  SABA use: none 02: none  Saw allergy [place]  05/14/18 > neg skin testing x for dust >>   pnds improved with atrovent NS rec For cough   >  mucinex dm 1200 mg every 12 hours and use the flutter valve as much as you can schedule HRCT of chest >  Neg bronchiectasis/  Please schedule a follow up office visit in 4  weeks, call sooner if needed with all medications /inhalers/ solutions in hand     06/23/2018  f/u ov/Davarious Tumbleson re: cough x 2016 / did not bring flutter  Chief Complaint  Patient presents with  . Follow-up    4wk f/u for cough. Per patient, cough is getting better. She has a mild ache in the middle of chest.   Dyspnea:  Occurs spontaneously at rest, resolves after a few breaths or a few min, not reproduced with ex  Cough: was doing better until "cough virus"  Mother's day but improving back to baseline now  Sleeping: better on side, no coughing fits at night  SABA use: none  02: none New midline cp typically in am with sense of "chest congestion"   Not with  exertion but not very active Always does better on prednisone  rec Gabapentin 100 mg three times a day  Prednisone 10 mg take  4 each am x 2 days,   2 each am x 2 days,  1 each am x 2 days and stop  Always cough and clear your throat into the flutter valve to prevent throat clearing Ok to stop the mucinex dm  Chlortrimeton 4 mg up to 2 every 4 hours as needed for drainage  Please schedule a follow up office visit in 6 weeks, call sooner if needed      08/04/2018  f/u ov/Isaak Delmundo re: cough 2016  /   Throat sensation is still there even on prednisone but cough much better on gabapentin 100 sometimes misses doses Chief Complaint  Patient presents with  . Follow-up  She is coughing less. She does clear her throat some.   Dyspnea:  Not limited by breathing from desired activities   Cough: resolved despite not taking gabapentin consistenly at 100 tid Sleeping: fine p 1st gen H1 blockers per guidelines  At hs SABA use: none 02: none   rec Increase gabapentin 100 mg qid    11/06/2018  f/u ov/Dehlia Kilner re: uacs x 2016  Chief Complaint  Patient presents with  . Follow-up    she has been coughing more since developed a cold late Nov 2019. She has occ PND.   Dyspnea:  Not limited by breathing from desired activities  / walks puppy / limited by R plantar faciitis Cough: only when sense of pnds Sleeping: lying flat/ about half the nights, wakes with need to clear throat / does not remember whether bed blocks bed SABA use: none 02: none  Still using mints and multiple antihistamines    No obvious day to day or daytime variability or assoc excess/ purulent sputum or mucus plugs or hemoptysis or cp or chest tightness, subjective wheeze or overt sinus or hb symptoms.     Also denies any obvious fluctuation of symptoms with weather or environmental changes or other aggravating or alleviating factors except as outlined above   No unusual exposure hx or h/o childhood pna/ asthma or knowledge of  premature birth.  Current Allergies, Complete Past Medical History, Past Surgical History, Family History, and Social History were reviewed in [employer] record.  ROS  The following are not active complaints unless bolded Hoarseness, sore throat, dysphagia, dental problems, itching, sneezing,  nasal congestion or discharge of excess mucus or purulent secretions, ear ache,   fever, chills, sweats, unintended wt loss or wt gain, classically pleuritic or exertional cp,  orthopnea pnd or arm/hand swelling  or leg swelling, presyncope, palpitations, abdominal pain, anorexia, nausea, vomiting, diarrhea  or change in bowel habits or change in bladder habits, change in stools or change in urine, dysuria, hematuria,  rash, arthralgias, visual complaints, headache, numbness, weakness or ataxia or problems with walking or coordination,  change in mood or  memory.        Current Meds  Medication Sig  . Biotin 5000 MCG TABS Take by mouth daily.  . calcium carbonate (TUMS - DOSED IN MG ELEMENTAL CALCIUM) 500 MG chewable tablet Chew 1 tablet by mouth as needed for indigestion or heartburn.  . Calcium-Magnesium-Vitamin D (CALCIUM 500 PO) Take by mouth 2 (two) times daily.  . cetirizine (ZYRTEC) 10 MG tablet Take 10 mg by mouth as needed for allergies.  . chlorpheniramine (CHLOR-TRIMETON) 4 MG tablet Take 8 mg by mouth every 4 (four) hours as needed for allergies.   . cyclobenzaprine (FLEXERIL) 10 MG tablet Take 10 mg by mouth as needed for muscle spasms.  . cycloSPORINE (RESTASIS) 0.05 % ophthalmic emulsion Apply to eye.  . diclofenac (VOLTAREN) 75 MG EC tablet Take 1 tablet (75 mg total) by mouth 2 (two) times daily.  . diclofenac sodium (VOLTAREN) 1 % GEL Voltaren Gel 3 grams to 3 large joints upto TID 3 TUBES with 3 refills  . diphenhydrAMINE HCl (BENADRYL PO) Take by mouth as needed.  Marland Kitchen EPIPEN 2-PAK 0.3 MG/0.3ML SOAJ injection use as directed for INJECTION  . esomeprazole (NEXIUM) 40  MG capsule TAKE 1 CAPSULE BY MOUTH TWICE DAILY BEFORE MEAL(S)  . finasteride (PROSCAR) 5 MG tablet Take 2.5 mg by mouth daily.  Marland Kitchen gabapentin (NEURONTIN) 100 MG capsule Take 1  capsule (100 mg total) by mouth 4 (four) times daily. (Patient taking differently: Take 100 mg by mouth 3 (three) times daily. )  . ipratropium (ATROVENT) 0.03 % nasal spray Place 1-2 sprays into the nose 3 (three) times daily as needed.  Marland Kitchen. levocetirizine (XYZAL) 5 MG tablet Take 5 mg by mouth as needed for allergies.  Marland Kitchen. loratadine (CLARITIN) 10 MG tablet Take 10 mg by mouth as needed for allergies.  . Magnesium 400 MG CAPS Take by mouth daily.  . methocarbamol (ROBAXIN) 500 MG tablet Take 500 mg by mouth as needed for muscle spasms.  . naproxen sodium (ANAPROX) 220 MG tablet Take by mouth as needed.   . Omega-3 Fatty Acids (FISH OIL) 1000 MG CAPS Take by mouth daily.  . Probiotic Product (PROBIOTIC PO) Take by mouth daily.  . ranitidine (ZANTAC) 150 MG tablet Take 150 mg by mouth as needed for heartburn.  Marland Kitchen. Respiratory Therapy Supplies (FLUTTER) DEVI Use as directed (Patient taking differently: as needed. Use as directed)  . sertraline (ZOLOFT) 50 MG tablet   . tiZANidine (ZANAFLEX) 4 MG tablet Take 1 tablet (4 mg total) by mouth at bedtime.  . TURMERIC PO Herbal Name: Tumeric  . VITAMIN D PO Take 25,000 Units by mouth 3 (three) times a week.                            Objective:   Physical Exam     11/06/2018          190  08/04/2018        191  06/23/2018        188  05/15/2018        191  04/01/2018        187  02/27/2018        190  01/30/2018        193   12/20/17 199 lb 3.2 oz (90.4 kg)  04/17/17 201 lb (91.2 kg)  01/15/17 201 lb (91.2 kg)        Vital signs reviewed - Note on arrival 02 sats  95% on RA         amb mod obese wf all smiles today    HEENT: nl dentition, turbinates bilaterally, and oropharynx. Nl external ear canals without cough reflex   NECK :  without JVD/Nodes/TM/ nl  carotid upstrokes bilaterally   LUNGS: no acc muscle use,  Nl contour chest which is clear to A and P bilaterally without cough on insp or exp maneuvers   CV:  RRR  no s3 or murmur or increase in P2, and no edema   ABD:  soft and nontender with nl inspiratory excursion in the supine position. No bruits or organomegaly appreciated, bowel sounds nl  MS:  Nl gait/ ext warm without deformities, calf tenderness, cyanosis or clubbing No obvious joint restrictions   SKIN: warm and dry without lesions    NEURO:  alert, approp, nl sensorium with  no motor or cerebellar deficits apparent.                Assessment:

## 2018-11-09 ENCOUNTER — Encounter: Payer: Self-pay | Admitting: Internal Medicine

## 2018-11-09 NOTE — Assessment & Plan Note (Signed)
01/30/2018  After extensive coaching inhaler device  effectiveness =    75% try symb 80 2bid sample> worse so stopped symbicort  - 03/07/17 MCT neg      Still no evidence of asthma with all signs / symptoms pointing to Upper airway cough syndrome (previously labeled PNDS),  is so named because it's frequently impossible to sort out how much is  CR/sinusitis with freq throat clearing (which can be related to primary GERD)   vs  causing  secondary (" extra esophageal")  GERD from wide swings in gastric pressure that occur with throat clearing, often  promoting self use of mint and menthol lozenges that reduce the lower esophageal sphincter tone and exacerbate the problem further in a cyclical fashion.   These are the same pts (now being labeled as having "irritable larynx syndrome" by some cough centers) who not infrequently have a history of having failed to tolerate ace inhibitors,  dry powder inhalers or biphosphonates or report having atypical/extraesophageal reflux symptoms that don't respond to standard doses of PPI  and are easily confused as having aecopd or asthma flares by even experienced allergists/ pulmonologists (myself included).     I had an extended discussion with the patient reviewing all relevant studies completed to date and  lasting 15   minutes of a 15 minute visit    Each maintenance medication was reviewed in detail including most importantly the difference between maintenance and prns and under what circumstances the prns are to be triggered using an action plan format that is not reflected in the computer generated alphabetically organized AVS.     Please see AVS for specific instructions unique to this visit that I personally wrote and verbalized to the the pt in detail and then reviewed with pt  by my nurse highlighting any  changes in therapy recommended at today's visit to their plan of care.

## 2018-11-09 NOTE — Assessment & Plan Note (Signed)
Onset 2016  singulair failed 2017-18 Spirometry 12/20/2017  wnl x for effort dep portion of f/v loop  - max gerd rx plus 1st gen H1 blockers per guidelines  12/20/2017 >>>did not use latter - CT sinus 01/01/2018  Ok  - FENO 01/30/2018  =   13  - 01/30/2018  After extensive coaching inhaler device  effectiveness =    75% try symbicort 80 2bid > cough so stopped - Allergy profile 02/27/2018 >  Eos 0.3 /  IgE 74  RAST Dust > ragweed  - 03/07/17 MCT neg  - Cylcical cough rex 04/01/2018  Saw allergy 05/14/18 > neg skin testing x for dust >>   pnds improved with atrovent NS - HRCT 05/28/2018 >>>   Neg ILD/ mod HH   - 06/23/2018 trial of gabapentin 100 tid improved but still globus sensation so rec 100 qid > better and wanted trial off 11/06/2018 but rec max rx with 1st gen H1 blockers per guidelines  And try wean off

## 2018-11-17 ENCOUNTER — Ambulatory Visit: Payer: Medicare Other | Admitting: Podiatry

## 2018-11-17 DIAGNOSIS — M722 Plantar fascial fibromatosis: Secondary | ICD-10-CM | POA: Diagnosis not present

## 2018-11-19 NOTE — Progress Notes (Signed)
   Subjective: 73 year old female presenting today for follow up evaluation of plantar fasciitis of the right foot. She states her pain has improved significantly. She has been doing the stretches, taking Diclofenac, using topical Voltaren, using OTC insoles and plantar fascial braces, all which have helped alleviate her pain. She denies modifying factors. Patient is here for further evaluation and treatment.   Past Medical History:  Diagnosis Date  . Allergy    rhinitis  . ANA positive   . Arthritis   . Complication of anesthesia   . Constipation   . Fibromyalgia   . GERD (gastroesophageal reflux disease)   . Heart murmur   . Helicobacter pylori (H. pylori) infection    s/p treatment  . Hiatal hernia   . Interstitial cystitis   . Pain in joint    site unspecified  . PONV (postoperative nausea and vomiting)   . Psoriasis   . Psoriatic arthritis (HCC)      Objective: Physical Exam General: The patient is alert and oriented x3 in no acute distress.  Dermatology: Skin is warm, dry and supple bilateral lower extremities. Negative for open lesions or macerations bilateral.   Vascular: Dorsalis Pedis and Posterior Tibial pulses palpable bilateral.  Capillary fill time is immediate to all digits.  Neurological: Epicritic and protective threshold intact bilateral.   Musculoskeletal: Tenderness to palpation to the plantar aspect of the right heel along the plantar fascia. All other joints range of motion within normal limits bilateral. Strength 5/5 in all groups bilateral.   Assessment: 1. Plantar fasciitis right - improved   Plan of Care:  1. Patient evaluated.  2. Continue using OTC insoles and plantar fascial braces.  3. Continue taking oral Diclofenac as needed.  4. Continue using topical Voltaren as needed.  5. Recommended good shoe gear.  6. Return to clinic as needed.    Retired Advice worker.    Felecia Shelling, DPM [facility]  Dr. Felecia Shelling, DPM    [address] [place], [place] [address]                Office [phone]  Fax [phone]

## 2019-01-27 ENCOUNTER — Encounter: Payer: Self-pay | Admitting: Internal Medicine

## 2019-01-27 ENCOUNTER — Telehealth: Payer: Self-pay | Admitting: Internal Medicine

## 2019-01-27 ENCOUNTER — Other Ambulatory Visit: Payer: Self-pay

## 2019-01-27 ENCOUNTER — Ambulatory Visit: Payer: Medicare Other | Admitting: Internal Medicine

## 2019-01-27 VITALS — BP 120/84 | HR 82 | Temp 98.4°F | Ht 64.0 in | Wt 193.0 lb

## 2019-01-27 DIAGNOSIS — R053 Chronic cough: Secondary | ICD-10-CM

## 2019-01-27 DIAGNOSIS — R05 Cough: Secondary | ICD-10-CM | POA: Diagnosis not present

## 2019-01-27 MED ORDER — PREDNISONE 10 MG PO TABS: ORAL_TABLET | ORAL | 0 refills | Status: DC

## 2019-01-27 MED ORDER — FLUTTER DEVI: 0 refills | Status: DC

## 2019-01-27 MED ORDER — SULFAMETHOXAZOLE-TRIMETHOPRIM 800-160 MG PO TABS: 1.0000 | ORAL_TABLET | Freq: Two times a day (BID) | ORAL | 0 refills | Status: DC

## 2019-01-27 NOTE — Patient Instructions (Addendum)
When coughing for any reason  >  nexium Take 30- 60 min before your first and last meals of the day   When nose dripping for any reason/  throat tickle try(instead of Zyrtec)  take CHLORPHENIRAMINE  4 mg - take one every 4 hours as needed - available over the counter- may cause drowsiness so start with just a bedtime dose or two and see how you tolerate it before trying in daytime  And also can use atrovent nasal spray as needed  Prednisone 10 mg take  4 each am x 2 days,   2 each am x 2 days,  1 each am x 2 days and stop   Bactrim DS one twice daily x 10 days should turn the mucus back to clear   GERD (REFLUX)  is an extremely common cause of respiratory symptoms just like yours , many times with no obvious heartburn at all.    It can be treated with medication, but also with lifestyle changes including elevation of the head of your bed (ideally with 6 -8inch blocks under the headboard of your bed),  Smoking cessation, avoidance of late meals, excessive alcohol, and avoid fatty foods, chocolate, peppermint, colas, red wine, and acidic juices such as orange juice.  NO MINT OR MENTHOL PRODUCTS SO NO COUGH DROPS  USE SUGARLESS CANDY INSTEAD (Jolley ranchers or Stover's or Life Savers) or even ice chips will also do - the key is to swallow to prevent all throat clearing. NO OIL BASED VITAMINS - use powdered substitutes.  Avoid fish oil when coughing   Please schedule a follow up visit in 4  months but call sooner if needed

## 2019-01-27 NOTE — Telephone Encounter (Signed)
Patient has been scheduled nothing further needed. 

## 2019-01-27 NOTE — Assessment & Plan Note (Signed)
Onset 2016   Susan Carpenter eval around 2014 eval Pos to trees/ pollen/ mold/ dust and shots x 2 years but  Had severe skin reactions to shots so they were stopped with no  Difference in symptoms  Singulair failed 2017-18 Spirometry 12/20/2017  wnl x for effort dep portion of f/v loop  - max gerd rx plus 1st gen H1 blockers per guidelines  12/20/2017 >>>did not use latter - CT sinus 01/01/2018  Ok  - FENO 01/30/2018  =   13  - 01/30/2018  After extensive coaching inhaler device  effectiveness =    75% try symbicort 80 2bid > cough so stopped - Allergy profile 02/27/2018 >  Eos 0.3 /  IgE 74  RAST Dust > ragweed  - 03/07/18 MCT neg  - Cylcical cough rx 04/01/2018  Saw allergy again 05/14/18 > neg skin testing x for dust >>   pnds improved with atrovent NS - HRCT 05/28/2018 >>>   Neg ILD/ mod HH  - 06/23/2018 trial of gabapentin 100 tid improved but still globus sensation so rec 100 qid > better and wanted trial off 11/06/2018 but rec max rx with 1st gen H1 blockers per guidelines  And then  try wean to  Off> restarted around 1st of march 2020 for "allergies" with pnds / did not try 1st gen H1 blockers per guidelines  Or atrovent  - 01/27/2019 retrained on purse lip breathing for "wheeze" and flutter valve in setting of "wheezing flare"   Of the three most common causes of  Sub-acute / recurrent or chronic cough, only one (GERD)  can actually contribute to/ trigger  the other two (asthma and post nasal drip syndrome)  and perpetuate the cylce of cough.  While not intuitively obvious, many patients with chronic low grade reflux do not cough until there is a primary insult that disturbs the protective epithelial barrier and exposes sensitive nerve endings.   This is typically viral but can due to PNDS and  either may apply here.   The point is that once this occurs, it is difficult to eliminate the cycle  using anything but a maximally effective acid suppression regimen at least in the short run, accompanied by an  appropriate diet to address non acid GERD and control / eliminate the cough itself with use of flutter valve/ mucinex dm and purse lip breathing plus continue gabapentin  but avoid narcotics here - once cough completely gone x 1 week ok to taper off the gabapentin again.   To rx possible sinusitis/ rhinitis/ bronchitis triggers rec bactrim ds x 10 days and Prednisone 10 mg take  4 each am x 2 days,   2 each am x 2 days,  1 each am x 2 days and stop     I had an extended discussion with the patient reviewing all relevant studies completed to date and  lasting 15 to 20 minutes of a 25 minute acute office visit     device teaching (flutter valve)   extended face to face time for this visit   Each maintenance medication was reviewed in detail including most importantly the difference between maintenance and prns and under what circumstances the prns are to be triggered using an action plan format that is not reflected in the computer generated alphabetically organized AVS.     Please see AVS for specific instructions unique to this visit that I personally wrote and verbalized to the the pt in detail and then reviewed with pt  by my nurse highlighting any  changes in therapy recommended at today's visit to their plan of care.

## 2019-01-27 NOTE — Telephone Encounter (Signed)
Not able to treat this over the phone based on multiple drug allergies and non-specific symptoms   If already following all the measures listed on avs including use of 1st gen H1 blockers per guidelines  (chlorpheniramine) and no better then I need to see her today with all meds in hand

## 2019-01-27 NOTE — Telephone Encounter (Signed)
Primary Pulmonologist: Dr. Sherene Sires Last office visit and with whom: 11/06/18 with Dr. Sherene Sires What do we see them for (pulmonary problems): chronic cough with ashtma  Reason for call: patient not feeling well. Patient stated last Thursday she started with symptoms of a head cold. Her husband just got over one. She had PND. Within 48 hours patient had symptoms of a deep cough with slight production of yellow/brown mucus. SOB and wheezing x2 days. Patient stated that her chest hurts, and she does not use inhalers. Patient denies fever, no body aches and no chills. No sneezing.   In the last month, have you been in contact with someone who was confirmed or suspected to have Conoravirus / COVID-19?  Patient answers no Have you traveled internationally or to an area with more than 100 reported cases of Coronavirus / COVID-19?  Patient has not traveled out of [place]   Do you have any of the following symptoms developed in the last 30 days? Fever: patient reports no. She has been checking/ Cough: yes Shortness of breath: yes with wheezing.   When did your symptoms start?  Patient stated 01/22/19 at 5:00pm  If the patient has a fever, what is the last reading?  (use n/a if patient denies fever)  N/A . IF THE PATIENT STATES THEY DO NOT OWN A THERMOMETER, THEY MUST GO AND PURCHASE ONE When did the fever start?: N/A Have you taken any medication to suppress a fever (ie Ibuprofen, Aleve, Tylenol)?: patient reports taking mucinex DM and Nyquil at night.   TRIAGE :: REMIND PATIENT TO SELF-ISOLATE WHILE THEY'RE MESSAGE IS BEING HANDLED  (examples of things to ask: : When did symptoms start? Fever? Cough? Productive? Color to sputum? More sputum than usual? Wheezing? Are you having any chest pain?  Have you needed increased oxygen? Are you taking your respiratory medications? What over the counter measures have you tried?)

## 2019-01-27 NOTE — Progress Notes (Signed)
Subjective:     Patient ID: Susan Carpenter, female   DOB: 06-25-1946,   MRN: [id number]     Brief patient profile:  88 yowf  never smoker with "allergies all her life"manifested by ? HA/some runny nose and pos w/u by Dr Irena Cords around 2014 eval Pos to trees/ pollen/ mold/ dust and shots x 2 years but  Had severe skin reactions to shots so they were stopped with no  Difference in symptoms per pt on vs off the shots  and varies zyrtec vs clariton and occ benadryl to control the symptoms  Then developed  insidious onset chronic cough x around 2016 so referred to pulmonary clinic 12/20/2017 by Dr Zachery Dauer   History of Present Illness  12/20/2017 1st [facility] office visit/ Susan Carpenter   Chief Complaint  Patient presents with  . Pulmonary Consult    Referred by Dr. Juluis Rainier. Pt c/o cough for 3 yrs. She states it's usually worse in the Winter, but "it's bad in the summer too". She states that the cough starts in the evening and will occ wake her up in the night. She has quite a bit of mucus in the mornings- unsure of color.  She also has occ chest tightness and the urge to clear her throat often.   onset was insidious and pattern variable = it can resolve for a few days at most then recurs and sometimes coughs up as much as a sev tsp esp in am's  Kouffman Reflux v Neurogenic Cough Differentiator Reflux Comments  Do you awaken from a sound sleep coughing violently?                            With trouble breathing? Not violently but sporadic   Do you have choking episodes when you cannot  Get enough air, gasping for air ?              no   Do you usually cough when you lie down into  The bed, or when you just lie down to rest ?                          Sporadic/ uses cough drops   Do you usually cough after meals or eating?         Yes   Do you cough when (or after) you bend over?    Yes   GERD SCORE     Kouffman Reflux v Neurogenic Cough Differentiator Neurogenic   Do you more-or-less  cough all day long? sporadic   Does change of temperature make you cough? no   Does laughing or chuckling cause you to cough? If lots of laugh   Do fumes (perfume, automobile fumes, burned  Toast, etc.,) cause you to cough ?      no   Does speaking, singing, or talking on the phone cause you to cough   ?               No    Neurogenic/Airway score      On nexium 20 mg 30 min before bfast and on multiple high dose oil based supplements  Cough to vomit it past  gen chest discomfort only during coughing fits  On plaquenil for alopecia  Not limited by breathing from desired activities  rec Change nexium to 40 mg Take 30- 60 min before your first and last meals  of the day  For drainage / throat tickle try take CHLORPHENIRAMINE  4 mg - take one every 4 hours as needed - GERD   Please see patient coordinator before you leave today  to schedule sinus ct If not improving >>> Prednisone 10 mg take  4 each am x 2 days,   2 each am x 2 days,  1 each am x 2 days and stop  Please schedule a follow up office visit in 6 weeks, sooner if needed  with all medications /inhalers/ solutions in hand so we can verify exactly what you are taking. This includes all medications from all doctors and over the counters - needs feno on return.    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 Follow-up    Cough has improved some. She occ will produce some sputum but she is unsure of color.  She has been having some HA and fatigue that she relates to her allergies.   Dyspnea:  Not limited by sob  Cough: supper time  And after  Sleep: not waking up  SABA use:  None  rec Go ahead and take the prednisone Symbicort 80 Take 2 puffs first thing in am and then another 2 puffs about 12 hours later.  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 Follow-up    She tried the symbicort for 1 wk and her cough got worse so she stopped. She states she went to the beach recently and she did not cough as much.   Dyspnea:  Felt fine during the walk x 27 min flat while at the beach    Cough: after supper and at bedtime min mucoid sputum  symbicort made the cough worse  Prednisone may have helped  rec Please remember to go to the lab department downstairs in the basement  for your tests - we will call you with the results when they are available. GERD  Diet  For drainage / throat tickle try take CHLORPHENIRAMINE  4 mg - take one every 4 hours as needed -     MCT neg 03/07/18   04/01/2018  f/u ov/Susan Carpenter re: cough since 2016 worse x 2 weeks - had improved to point where only cough q 3-4 days  Chief Complaint  Patient presents with  . Acute Visit    Increased cough since 03/16/18- she feels like sputum comes up but she does not cough it out.   Dyspnea:  Mostly related to cough  Cough: severe but never spits any out ever - always swallow, never able to expectorate Feels fine when wakes up then worse as day goes on  gen chest and upper abd discomfort brought on by coughing fits on rec The key to effective treatment for your cough is eliminating the non-stop cycle of cough you're stuck in long enough to let your airway  heal completely and then see if there is anything still making you cough once you stop the cough suppression, but this should take no more than 5 days to figure out Any time you feel the urge to cough, do so into the flutter valve to prevent airway trauma  First take mucinex dm 1200  every 12 hours and supplement if needed with  tramadol 50 mg up to 2 every 4 hours Prednisone 10 mg take  4 each am x 2 days,   2 each am x 2 days,  1 each am x 2 days and stop (this is to eliminate allergies and inflammation from coughing) GERD diet Calcium should be gluconate, no  carbonate form of calcium  Please schedule a follow up office visit in 6 weeks, call sooner if needed with all active medications in hand including over the counters       05/15/2018  f/u ov/Susan Carpenter re: cough x 2016   Chief Complaint  Patient presents with  . Follow-up    Cough has improved some, but still present. She still has some green to brown sputum.    Dyspnea:  MMRC1 = can walk nl pace, flat grade, can't hurry or go uphills or steps s sob   Cough: worse mid morning now >>>  A tsp of thick mucus slt green, levaquin did not help (rx 04/07/18 ) not using mucinex dm or flutter as instructed  Sleeping: on either side with one pillow s noct cough  SABA use: none 02: none  Saw allergy [place]  05/14/18 > neg skin testing x for dust >>   pnds improved with atrovent NS rec For cough   >  mucinex dm 1200 mg every 12 hours and use the flutter valve as much as you can schedule HRCT of chest >  Neg bronchiectasis/  Please schedule a follow up office visit in 4  weeks, call sooner if needed with all medications /inhalers/ solutions in hand     06/23/2018  f/u ov/Susan Carpenter re: cough x 2016 / did not bring flutter  Chief Complaint  Patient presents with  . Follow-up    4wk f/u for cough. Per patient, cough is getting better. She has a mild ache in the middle of chest.   Dyspnea:  Occurs spontaneously at rest, resolves after a few breaths or a few min, not reproduced with ex  Cough: was doing better until "cough virus"  Mother's day but improving back to baseline now  Sleeping: better on side, no coughing fits at night  SABA use: none  02: none New midline cp typically in am with sense of "chest congestion"   Not with  exertion but not very active Always does better on prednisone  rec Gabapentin 100 mg three times a day  Prednisone 10 mg take  4 each am x 2 days,   2 each am x 2 days,  1 each am x 2 days and stop  Always cough and clear your throat into the flutter valve to prevent throat clearing Ok to stop the mucinex dm  Chlortrimeton 4 mg up to 2 every 4 hours as needed for drainage  Please schedule a follow up office visit in 6 weeks, call sooner if needed      08/04/2018  f/u ov/Susan Carpenter re: cough 2016  /   Throat sensation is still there even on prednisone but cough much better on gabapentin 100 sometimes misses doses Chief Complaint  Patient presents with  . Follow-up  She is coughing less. She does clear her throat some.   Dyspnea:  Not limited by breathing from desired activities   Cough: resolved despite not taking gabapentin consistenly at 100 tid Sleeping: fine p 1st gen H1 blockers per guidelines  At hs SABA use: none 02: none   rec Increase gabapentin 100 mg qid    11/06/2018  f/u ov/Susan Carpenter re: uacs x 2016  Chief Complaint  Patient presents with  . Follow-up    she has been coughing more since developed a cold late Nov 2019. She has occ PND.   Dyspnea:  Not limited by breathing from desired activities  / walks puppy / limited by R plantar faciitis Cough: only when sense of pnds Sleeping: lying flat/ about half the nights, wakes with need to clear throat / does not remember whether bed blocks bed SABA use: none 02: none  Still using mints and multiple antihistamines  rec Try chlortrimeton 4 mg x 2 right at bedtime to see if you sleep better with less night time drainage  Pick zyrtec and take as a maintenance each am  Ok to try off gabapentin to see if helps Keep the candy handy - no peppermint!     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 Sinsusitis/ pnds with pseuedowheezing  Chief Complaint  Patient presents with  . Acute Visit    Pt c/o cough  with brown to green sputum and wheezing for the past several days.   had stopped gabapentin but restarted  3 x daily though numbers don't add up as last bottle says refill was 10/03/18 and is full ("I add the new to the old" and have two homes with bottles in each) Acutely worse p husband's "cold"01/22/19 with nasal congestion / nose running with purulent secretions and sputum  but no fever and worse cough 01/24/19 with subjective wheeze but no fever > rx mucinex dm 1200 mg every 12 hours but no flutter valve  Tried albuterol in past and didn't work and symbicort made cough def worse  No sob at rest unless during coughing fits   No obvious day to day or daytime variability or assoc   mucus plugs or hemoptysis or   chest tightness, subjective wheeze or overt sinus or hb symptoms.     Also denies any obvious fluctuation of symptoms with weather or environmental changes or other aggravating or alleviating factors except as outlined above   No unusual exposure hx or h/o childhood pna/ asthma or knowledge of premature birth.  Current Allergies, Complete Past Medical History, Past Surgical History, Family History, and Social History were reviewed in [employer] record.  ROS  The following are not active complaints unless bolded Hoarseness, sore throat, dysphagia, dental problems, itching, sneezing,  nasal congestion or discharge of excess mucus or purulent secretions, ear ache,   fever, chills, sweats, unintended wt loss or wt gain, classically pleuritic or exertional cp,  orthopnea pnd or arm/hand swelling  or leg swelling, presyncope, palpitations, abdominal pain, anorexia, nausea, vomiting, diarrhea  or change in bowel habits or change in bladder habits, change in stools or change in urine, dysuria, hematuria,  rash, arthralgias, visual complaints, headache, numbness, weakness or ataxia or problems with walking or coordination,  change in mood or  memory.        Current Meds   Medication Sig  . Biotin 5000 MCG TABS Take by mouth daily.  . calcium carbonate (TUMS - DOSED IN  MG ELEMENTAL CALCIUM) 500 MG chewable tablet Chew 1 tablet by mouth as needed for indigestion or heartburn.  . Calcium-Magnesium-Vitamin D (CALCIUM 500 PO) Take by mouth 2 (two) times daily.  . cetirizine (ZYRTEC) 10 MG tablet Take 10 mg by mouth as needed for allergies.  . cyclobenzaprine (FLEXERIL) 10 MG tablet Take 10 mg by mouth as needed for muscle spasms.  . cycloSPORINE (RESTASIS) 0.05 % ophthalmic emulsion Apply to eye.  Marland Kitchen Dextromethorphan-guaiFENesin (MUCINEX DM MAXIMUM STRENGTH) 60-1200 MG TB12 Take 1 tablet by mouth 2 (two) times daily as needed.  . diclofenac sodium (VOLTAREN) 1 % GEL Voltaren Gel 3 grams to 3 large joints upto TID 3 TUBES with 3 refills  . DM-Doxylamine-Acetaminophen (NYQUIL COLD & FLU PO) Take by mouth as needed.  Marland Kitchen EPIPEN 2-PAK 0.3 MG/0.3ML SOAJ injection use as directed for INJECTION  . esomeprazole (NEXIUM) 40 MG capsule TAKE 1 CAPSULE BY MOUTH TWICE DAILY BEFORE MEAL(S)  . finasteride (PROSCAR) 5 MG tablet Take 2.5 mg by mouth daily.  Marland Kitchen gabapentin (NEURONTIN) 100 MG capsule Take 100 mg by mouth 4 (four) times daily.  . Homeopathic Products (ZICAM COLD REMEDY PO) Take by mouth as directed.  . Magnesium 400 MG CAPS Take by mouth daily.  . methocarbamol (ROBAXIN) 500 MG tablet Take 500 mg by mouth as needed for muscle spasms.  . naproxen sodium (ANAPROX) 220 MG tablet Take by mouth as needed.   . Omega-3 Fatty Acids (FISH OIL) 1000 MG CAPS Take by mouth daily.  . Probiotic Product (PROBIOTIC PO) Take by mouth daily.  Marland Kitchen Respiratory Therapy Supplies (FLUTTER) DEVI Use as directed (Patient taking differently: as needed. Use as directed)  . sertraline (ZOLOFT) 50 MG tablet   . TURMERIC PO Herbal Name: Tumeric  . VITAMIN D PO Take 25,000 Units by mouth 3 (three) times a week.            Objective:   Physical Exam  amb wf non-toxic, striking upper airway  wheezing resolves with plm   01/27/2019        193  11/06/2018          190  08/04/2018        191  06/23/2018        188  05/15/2018        191  04/01/2018        187  02/27/2018        190  01/30/2018        193   12/20/17 199 lb 3.2 oz (90.4 kg)  04/17/17 201 lb (91.2 kg)        Vital signs reviewed - Note on arrival 02 sats  97% on RA    HEENT: nl dentition,  and oropharynx. Nl external ear canals without cough reflex - mild  bilateral non-specific turbinate edema     NECK :  without JVD/Nodes/TM/ nl carotid upstrokes bilaterally   LUNGS: no acc muscle use,  Nl contour chest which is clear to A and P bilaterally without cough on insp or exp maneuvers   CV:  RRR  no s3 or murmur or increase in P2, and no edema   ABD:  soft and nontender with nl inspiratory excursion in the supine position. No bruits or organomegaly appreciated, bowel sounds nl  MS:  Nl gait/ ext warm without deformities, calf tenderness, cyanosis or clubbing No obvious joint restrictions   SKIN: warm and dry without lesions    NEURO:  alert, approp, nl sensorium  with  no motor or cerebellar deficits apparent.              Assessment:

## 2019-02-17 ENCOUNTER — Ambulatory Visit: Payer: Medicare Other | Admitting: Internal Medicine

## 2019-03-10 ENCOUNTER — Other Ambulatory Visit: Payer: Self-pay | Admitting: Internal Medicine

## 2019-03-24 NOTE — Progress Notes (Signed)
Office Visit Note  Patient: Susan Carpenter             Date of Birth: 09/03/1946           MRN: [id number]             PCP: Juluis RainierBarnes, Elizabeth, MD Referring: Juluis RainierBarnes, Elizabeth, MD Visit Date: 04/07/2019 Occupation: @GUAROCC @  Subjective:  Pain in multiple joints   History of Present Illness: Susan Carpenter is a 73 y.o. female with history of fibromyalgia, osteoarthritis, and DDD. She continues to have generalized muscle aches and muscle tenderness.  She reports she has been flaring twice monthly.  She has been having difficulty sleeping at night due to the discomfort she experiences. She has chronic fatigue. She has occasional bilateral trochanteric bursitis and bilateral shoulder joint pain.  She has been having worsening pain in the right knee joint that is replaced.  She has chronic pain in both ankle joints and bilateral plantar fasciitis.  She has chronic neck pain and stiffness.  She denies any lower back pain. She has severe pain in both hands and has intermittent joint swelling.  She takes aleve for pain relief and uses voltaren gel topically prn.   Activities of Daily Living:  Patient reports morning stiffness for 5 minutes.   Patient Reports nocturnal pain.  Difficulty dressing/grooming: Denies Difficulty climbing stairs: Denies Difficulty getting out of chair: Denies Difficulty using hands for taps, buttons, cutlery, and/or writing: Reports  Review of Systems  Constitutional: Positive for fatigue.  HENT: Positive for mouth dryness. Negative for mouth sores and nose dryness.   Eyes: Positive for dryness. Negative for pain and itching.  Respiratory: Negative for shortness of breath, wheezing and difficulty breathing.   Cardiovascular: Negative for palpitations and swelling in legs/feet.  Gastrointestinal: Negative for abdominal pain, constipation and diarrhea.  Endocrine: Negative for increased urination.  Genitourinary: Negative for painful urination and pelvic pain.   Musculoskeletal: Positive for arthralgias, joint pain, joint swelling and morning stiffness.  Skin: Negative for rash and redness.  Allergic/Immunologic: Negative for susceptible to infections.  Neurological: Positive for weakness. Negative for dizziness, light-headedness, headaches and memory loss.  Hematological: Positive for bruising/bleeding tendency.  Psychiatric/Behavioral: Negative for confusion. The patient is not nervous/anxious.     PMFS History:  Patient Active Problem List   Diagnosis Date Noted  . Status post left rotator cuff repair 10/01/2018  . History of total knee replacement, right 10/01/2018  . DDD (degenerative disc disease), cervical 10/01/2018  . DDD (degenerative disc disease), lumbar 10/01/2018  . Cough variant asthma vs uacs 02/02/2018  . Chronic cough 12/20/2017  . Other insomnia 01/15/2017  . Other fatigue 01/15/2017  . Trapezius muscle spasm 01/15/2017  . 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 Primary osteoarthritis of both hands 09/19/2016  . OA (osteoarthritis) of knee 08/01/2015  . INTERSTITIAL CYSTITIS 01/31/2010  . Fibromyalgia 01/31/2010  . FATIGUE 01/31/2010  . DYSPNEA/SHORTNESS OF BREATH 01/31/2010  . FREQUENCY, URINARY 01/31/2010  . Depression 01/24/2009  . Migraines 09/04/2008  . HELICOBACTER PYLORI INFECTION 10/14/2007  . Allergic rhinitis 10/14/2007  . HIATAL HERNIA 10/14/2007  . ANA POSITIVE 10/14/2007  . Hypothyroidism 10/10/2007  . PAIN IN JOINT, SITE UNSPECIFIED 10/10/2007  . WEIGHT GAIN 10/10/2007    Past Medical History:  Diagnosis Date  . Allergy    rhinitis  . ANA positive   . Arthritis   . Complication of anesthesia   . Constipation   . Fibromyalgia   . GERD (gastroesophageal reflux disease)   .  Heart murmur   . Helicobacter pylori (H. pylori) infection    s/p treatment  . Hiatal hernia   . Interstitial cystitis   . Pain in joint    site unspecified  . PONV (postoperative nausea and vomiting)   . Psoriasis   .  Psoriatic arthritis (HCC)     Family History  Problem Relation Age of Onset  . Diabetes Father   . Heart disease Father        MI  . Alcohol abuse Father   . Cancer Mother        COLON   . Mental illness Mother        ALZHEIMERS; DEPRESSION  . Thyroid disease Mother        HYPOTHYROID  . Alcohol abuse Maternal Uncle   . Diabetes Maternal Uncle   . Hypertension Maternal Grandmother   . Stroke Maternal Grandmother    Past Surgical History:  Procedure Laterality Date  . BLADDER SURGERY     x2 for scar tissue  . CHOLECYSTECTOMY  1995  . cysts on ovaries    . polyp removal  1984   from fallopian tube  . ROTATOR CUFF REPAIR Left 06/2017  . THUMB SURGERY Right 01/16/2016  . TOTAL KNEE ARTHROPLASTY Right 08/01/2015   Procedure: RIGHT TOTAL KNEE ARTHROPLASTY;  Surgeon: Ollen Gross, MD;  Location: [facility];  Service: Orthopedics;  Laterality: Right;  . TUBAL LIGATION     Social History   Social History Narrative  . Not on file   Immunization History  Administered Date(s) Administered  . Influenza Whole 08/05/2004  . Influenza, High Dose Seasonal PF 08/04/2018  . Influenza-Unspecified 08/05/2013  . Td 07/06/2004     Objective: Vital Signs: BP (!) 143/91 (BP Location: Left Arm, Patient Position: Sitting, Cuff Size: Normal)   Pulse 81   Resp 14   Ht 5\' 4"  (1.626 m)   Wt 194 lb (88 kg)   BMI 33.30 kg/m    Physical Exam Vitals signs and nursing note reviewed.  Constitutional:      Appearance: She is well-developed.  HENT:     Head: Normocephalic and atraumatic.  Eyes:     Conjunctiva/sclera: Conjunctivae normal.  Neck:     Musculoskeletal: Normal range of motion.  Cardiovascular:     Rate and Rhythm: Normal rate and regular rhythm.     Heart sounds: Normal heart sounds.  Pulmonary:     Effort: Pulmonary effort is normal.     Breath sounds: Normal breath sounds.  Abdominal:     General: Bowel sounds are normal.     Palpations: Abdomen is soft.   Lymphadenopathy:     Cervical: No cervical adenopathy.  Skin:    General: Skin is warm and dry.     Capillary Refill: Capillary refill takes less than 2 seconds.  Neurological:     Mental Status: She is alert and oriented to person, place, and time.  Psychiatric:        Behavior: Behavior normal.      Musculoskeletal Exam: C-spine, thoracic spine, and lumbar spine good ROM.  No midline spinal tenderness.  No SI joint tenderness.  Shoulder joints, elbow joints, wrist joints, MTPs, PIPs, and DIPs good ROM with no synovitis.  Right 4th PIP joint inflammation and tenderness.  Left 3rd and 4th PIP joint inflammation and tenderness.  Subluxation of bilateral 1st DIP joints. Hip joints good ROM.  Left IT band syndrome.  Right knee replacement has good ROM with no warmth  or effusion.  Left knee good ROM with no warmth or effusion.  Tenderness over bilateral trochanteric bursa and on the dorsal aspect.  Bilateral plantar fasciitis.    CDAI Exam: CDAI Score: Not documented Patient Global Assessment: Not documented; Provider Global Assessment: Not documented Swollen: Not documented; Tender: Not documented Joint Exam   Not documented   There is currently no information documented on the homunculus. Go to the Rheumatology activity and complete the homunculus joint exam.  Investigation: No additional findings.  Imaging: No results found.  Recent Labs: Lab Results  Component Value Date   WBC 6.5 02/27/2018   HGB 13.7 02/27/2018   PLT 227.0 02/27/2018   NA 140 01/15/2017   K 4.6 01/15/2017   CL 106 01/15/2017   CO2 25 01/15/2017   GLUCOSE 96 01/15/2017   BUN 17 01/15/2017   CREATININE 0.82 01/15/2017   BILITOT 0.3 01/15/2017   ALKPHOS 82 01/15/2017   AST 22 01/15/2017   ALT 22 01/15/2017   PROT 6.4 01/15/2017   ALBUMIN 4.0 01/15/2017   CALCIUM 9.2 01/15/2017   GFRAA 84 01/15/2017    Speciality Comments: No specialty comments available.  Procedures:  No procedures performed  Allergies: Cephalexin; Oyster shell; Penicillins; Shellfish allergy; Codeine; Doxycycline; and Erythromycin   Assessment / Plan:     Visit Diagnoses: Fibromyalgia: She has generalized muscle aches muscle tenderness due to fibromyalgia.  She has been flaring twice monthly.  She is been having difficulty sleeping due to the discomfort she experiences.  She has intermittent bilateral shoulder pain and bilateral trochanteric bursitis at night when lying on her sides.  She has chronic fatigue despite trying to stay active and exercise on a regular basis.  She is manageable to walk at least 10,000 steps per day.  We discussed the importance of regular exercise and good sleep hygiene.  She will follow up as needed.   Other fatigue: Chronic.  She was encouraged to stay active and exercise on a regular basis.   Other insomnia: She has difficulty sleeping at night due to the discomfort she experiences.  Good sleep hygiene was discussed.  Primary osteoarthritis of both hands: Severe PIP and DIP synovial thickening assist with osteoarthritis of bilateral hands.  She has inflammation and tenderness of the right fourth PIP joint and the left third and fourth PIP joints.  She has incomplete fist formation bilaterally.  She has chronic pain in bilateral hands.  Joint protection and muscle strengthening were discussed.  She is encouraged to continue to use Voltaren gel topically PRN for pain relief.  A refill of voltaren gel was sent to the pharmacy.   Status post left rotator cuff repair: She has occasional left shoulder joint pain.  She has good ROM on exam.   History of total knee replacement, right: Doing well.  No warmth or effusion.  Good ROM with no discomfort at this time.  She has been having intermittent pain in the right knee joint.   Plantar fasciitis, bilateral: She has chronic bilateral plantar fasciitis.  She wears orthotics daily.  She has been evaluated by a podiatrist in the past and has had  cortisone injections.   DDD (degenerative disc disease), cervical: She has chronic neck pain and stiffness.  She has no symptoms of radiculopathy at this time.    DDD (degenerative disc disease), lumbar: She has no discomfort at this time.   Other medical conditions are listed as follows:   History of hypothyroidism  History of gastroesophageal reflux (GERD)  History of depression  Interstitial cystitis  Primary osteoarthritis of both knees - Plan: diclofenac sodium (VOLTAREN) 1 % GEL   Orders: No orders of the defined types were placed in this encounter.  Meds ordered this encounter  Medications  . diclofenac sodium (VOLTAREN) 1 % GEL    Sig: Apply 2 grams to 4 grams to affected to area up to 4 times daily as needed    Dispense:  4 Tube    Refill:  2    Face-to-face time spent with patient was 30 minutes. Greater than 50% of time was spent in counseling and coordination of care.  Follow-Up Instructions: Return if symptoms worsen or fail to improve, for Fibromyalgia, Osteoarthritis.   Gearldine Bienenstock, PA-C I examined and evaluated the patient with Sherron Ales PA.  Patient continues to have some discomfort from fibromyalgia and osteoarthritis.  She had no synovitis on examination.  The plan of care was discussed as noted above.  Pollyann Savoy, MD Note - This record has been created using Animal nutritionist.  Chart creation errors have been sought, but may not always  have been located. Such creation errors do not reflect on  the standard of medical care.

## 2019-04-07 ENCOUNTER — Other Ambulatory Visit: Payer: Self-pay

## 2019-04-07 ENCOUNTER — Encounter: Payer: Self-pay | Admitting: Rheumatology

## 2019-04-07 ENCOUNTER — Ambulatory Visit: Payer: Medicare Other | Admitting: Rheumatology

## 2019-04-07 VITALS — BP 143/91 | HR 81 | Resp 14 | Ht 64.0 in | Wt 194.0 lb

## 2019-04-07 DIAGNOSIS — Z8639 Personal history of other endocrine, nutritional and metabolic disease: Secondary | ICD-10-CM

## 2019-04-07 DIAGNOSIS — M170 Bilateral primary osteoarthritis of knee: Secondary | ICD-10-CM

## 2019-04-07 DIAGNOSIS — M19042 Primary osteoarthritis, left hand: Secondary | ICD-10-CM

## 2019-04-07 DIAGNOSIS — Z9889 Other specified postprocedural states: Secondary | ICD-10-CM

## 2019-04-07 DIAGNOSIS — M797 Fibromyalgia: Secondary | ICD-10-CM | POA: Diagnosis not present

## 2019-04-07 DIAGNOSIS — Z8659 Personal history of other mental and behavioral disorders: Secondary | ICD-10-CM

## 2019-04-07 DIAGNOSIS — M5136 Other intervertebral disc degeneration, lumbar region: Secondary | ICD-10-CM

## 2019-04-07 DIAGNOSIS — M19041 Primary osteoarthritis, right hand: Secondary | ICD-10-CM

## 2019-04-07 DIAGNOSIS — G4709 Other insomnia: Secondary | ICD-10-CM | POA: Diagnosis not present

## 2019-04-07 DIAGNOSIS — Z8719 Personal history of other diseases of the digestive system: Secondary | ICD-10-CM

## 2019-04-07 DIAGNOSIS — R5383 Other fatigue: Secondary | ICD-10-CM | POA: Diagnosis not present

## 2019-04-07 DIAGNOSIS — Z96651 Presence of right artificial knee joint: Secondary | ICD-10-CM

## 2019-04-07 DIAGNOSIS — M722 Plantar fascial fibromatosis: Secondary | ICD-10-CM

## 2019-04-07 DIAGNOSIS — N3010 Interstitial cystitis (chronic) without hematuria: Secondary | ICD-10-CM

## 2019-04-07 DIAGNOSIS — M5030 Other cervical disc degeneration, unspecified cervical region: Secondary | ICD-10-CM

## 2019-04-07 MED ORDER — DICLOFENAC SODIUM 1 % TD GEL: TRANSDERMAL | 2 refills | Status: DC

## 2019-05-29 ENCOUNTER — Ambulatory Visit: Payer: Medicare Other | Admitting: Internal Medicine

## 2019-06-02 ENCOUNTER — Ambulatory Visit: Payer: Medicare Other | Admitting: Internal Medicine

## 2019-08-18 ENCOUNTER — Other Ambulatory Visit: Payer: Self-pay

## 2019-08-18 ENCOUNTER — Ambulatory Visit: Payer: Medicare Other | Admitting: Internal Medicine

## 2019-08-18 ENCOUNTER — Encounter: Payer: Self-pay | Admitting: Internal Medicine

## 2019-08-18 DIAGNOSIS — R05 Cough: Secondary | ICD-10-CM | POA: Diagnosis not present

## 2019-08-18 DIAGNOSIS — R053 Chronic cough: Secondary | ICD-10-CM

## 2019-08-18 MED ORDER — ESOMEPRAZOLE MAGNESIUM 40 MG PO CPDR: DELAYED_RELEASE_CAPSULE | ORAL | 3 refills | Status: DC

## 2019-08-18 NOTE — Patient Instructions (Addendum)
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

## 2019-08-18 NOTE — Progress Notes (Signed)
Subjective:     Patient ID: Susan Carpenter, female   DOB: 06-25-1946,   MRN: [id number]     Brief patient profile:  88 yowf  never smoker with "allergies all her life"manifested by ? HA/some runny nose and pos w/u by Dr Irena Cords around 2014 eval Pos to trees/ pollen/ mold/ dust and shots x 2 years but  Had severe skin reactions to shots so they were stopped with no  Difference in symptoms per pt on vs off the shots  and varies zyrtec vs clariton and occ benadryl to control the symptoms  Then developed  insidious onset chronic cough x around 2016 so referred to pulmonary clinic 12/20/2017 by Dr Zachery Dauer   History of Present Illness  12/20/2017 1st [facility] office visit/ E[facility]lock   Chief Complaint  Patient presents with  . Pulmonary Consult    Referred by Dr. Juluis Rainier. Pt c/o cough for 3 yrs. She states it's usually worse in the Winter, but "it's bad in the summer too". She states that the cough starts in the evening and will occ wake her up in the night. She has quite a bit of mucus in the mornings- unsure of color.  She also has occ chest tightness and the urge to clear her throat often.   onset was insidious and pattern variable = it can resolve for a few days at most then recurs and sometimes coughs up as much as a sev tsp esp in am's  Kouffman Reflux v Neurogenic Cough Differentiator Reflux Comments  Do you awaken from a sound sleep coughing violently?                            With trouble breathing? Not violently but sporadic   Do you have choking episodes when you cannot  Get enough air, gasping for air ?              no   Do you usually cough when you lie down into  The bed, or when you just lie down to rest ?                          Sporadic/ uses cough drops   Do you usually cough after meals or eating?         Yes   Do you cough when (or after) you bend over?    Yes   GERD SCORE     Kouffman Reflux v Neurogenic Cough Differentiator Neurogenic   Do you more-or-less  cough all day long? sporadic   Does change of temperature make you cough? no   Does laughing or chuckling cause you to cough? If lots of laugh   Do fumes (perfume, automobile fumes, burned  Toast, etc.,) cause you to cough ?      no   Does speaking, singing, or talking on the phone cause you to cough   ?               No    Neurogenic/Airway score      On nexium 20 mg 30 min before bfast and on multiple high dose oil based supplements  Cough to vomit it past  gen chest discomfort only during coughing fits  On plaquenil for alopecia  Not limited by breathing from desired activities  rec Change nexium to 40 mg Take 30- 60 min before your first and last meals  of the day  For drainage / throat tickle try take CHLORPHENIRAMINE  4 mg - take one every 4 hours as needed - GERD   Please see patient coordinator before you leave today  to schedule sinus ct If not improving >>> Prednisone 10 mg take  4 each am x 2 days,   2 each am x 2 days,  1 each am x 2 days and stop  Please schedule a follow up office visit in 6 weeks, sooner if needed  with all medications /inhalers/ solutions in hand so we can verify exactly what you are taking. This includes all medications from all doctors and over the counters - needs feno on return.    01/30/2018  f/u ov/Castle Lamons re:  uacs vs cough variant asthma / poor cough control since 2016/ did not bring all meds Chief Complaint  Patient presents with  . Follow-up    Cough has improved some. She occ will produce some sputum but she is unsure of color.  She has been having some HA and fatigue that she relates to her allergies.   Dyspnea:  Not limited by sob  Cough: supper time  And after  Sleep: not waking up  SABA use:  None  rec Go ahead and take the prednisone Symbicort 80 Take 2 puffs first thing in am and then another 2 puffs about 12 hours later.  Work on inhaler technique:    Stop the clariton and zyrtec and try  CHLORPHENIRAMINE  4 mg - take one - two every  4 hours as needed - available over the counter- may cause drowsiness so start with just a bedtime dose or two and see how you tolerate it before trying in daytime      02/27/2018  f/u ov/Jamese Trauger re: uacs vs cough variant asthma/ poor control since 2016/brought meds but not clear she ever tried h1 as rec last ov  Chief Complaint  Patient presents with  . Follow-up    She tried the symbicort for 1 wk and her cough got worse so she stopped. She states she went to the beach recently and she did not cough as much.   Dyspnea:  Felt fine during the walk x 27 min flat while at the beach    Cough: after supper and at bedtime min mucoid sputum  symbicort made the cough worse  Prednisone may have helped  rec Please remember to go to the lab department downstairs in the basement  for your tests - we will call you with the results when they are available. GERD  Diet  For drainage / throat tickle try take CHLORPHENIRAMINE  4 mg - take one every 4 hours as needed -     MCT neg 03/07/18   04/01/2018  f/u ov/Chantella Creech re: cough since 2016 worse x 2 weeks - had improved to point where only cough q 3-4 days  Chief Complaint  Patient presents with  . Acute Visit    Increased cough since 03/16/18- she feels like sputum comes up but she does not cough it out.   Dyspnea:  Mostly related to cough  Cough: severe but never spits any out ever - always swallow, never able to expectorate Feels fine when wakes up then worse as day goes on  gen chest and upper abd discomfort brought on by coughing fits on rec The key to effective treatment for your cough is eliminating the non-stop cycle of cough you're stuck in long enough to let your airway  heal completely and then see if there is anything still making you cough once you stop the cough suppression, but this should take no more than 5 days to figure out Any time you feel the urge to cough, do so into the flutter valve to prevent airway trauma  First take mucinex dm 1200  every 12 hours and supplement if needed with  tramadol 50 mg up to 2 every 4 hours Prednisone 10 mg take  4 each am x 2 days,   2 each am x 2 days,  1 each am x 2 days and stop (this is to eliminate allergies and inflammation from coughing) GERD diet Calcium should be gluconate, no  carbonate form of calcium  Please schedule a follow up office visit in 6 weeks, call sooner if needed with all active medications in hand including over the counters       05/15/2018  f/u ov/Ardyce Heyer re: cough x 2016   Chief Complaint  Patient presents with  . Follow-up    Cough has improved some, but still present. She still has some green to brown sputum.    Dyspnea:  MMRC1 = can walk nl pace, flat grade, can't hurry or go uphills or steps s sob   Cough: worse mid morning now >>>  A tsp of thick mucus slt green, levaquin did not help (rx 04/07/18 ) not using mucinex dm or flutter as instructed  Sleeping: on either side with one pillow s noct cough  SABA use: none 02: none  Saw allergy [place]  05/14/18 > neg skin testing x for dust >>   pnds improved with atrovent NS rec For cough   >  mucinex dm 1200 mg every 12 hours and use the flutter valve as much as you can schedule HRCT of chest >  Neg bronchiectasis/  Please schedule a follow up office visit in 4  weeks, call sooner if needed with all medications /inhalers/ solutions in hand     06/23/2018  f/u ov/Tashi Andujo re: cough x 2016 / did not bring flutter  Chief Complaint  Patient presents with  . Follow-up    4wk f/u for cough. Per patient, cough is getting better. She has a mild ache in the middle of chest.   Dyspnea:  Occurs spontaneously at rest, resolves after a few breaths or a few min, not reproduced with ex  Cough: was doing better until "cough virus"  Mother's day but improving back to baseline now  Sleeping: better on side, no coughing fits at night  SABA use: none  02: none New midline cp typically in am with sense of "chest congestion"   Not with  exertion but not very active Always does better on prednisone  rec Gabapentin 100 mg three times a day  Prednisone 10 mg take  4 each am x 2 days,   2 each am x 2 days,  1 each am x 2 days and stop  Always cough and clear your throat into the flutter valve to prevent throat clearing Ok to stop the mucinex dm  Chlortrimeton 4 mg up to 2 every 4 hours as needed for drainage  Please schedule a follow up office visit in 6 weeks, call sooner if needed      08/04/2018  f/u ov/Chirag Krueger re: cough 2016  /   Throat sensation is still there even on prednisone but cough much better on gabapentin 100 sometimes misses doses Chief Complaint  Patient presents with  . Follow-up  She is coughing less. She does clear her throat some.   Dyspnea:  Not limited by breathing from desired activities   Cough: resolved despite not taking gabapentin consistenly at 100 tid Sleeping: fine p 1st gen H1 blockers per guidelines  At hs SABA use: none 02: none   rec Increase gabapentin 100 mg qid    11/06/2018  f/u ov/Marijose Curington re: uacs x 2016  Chief Complaint  Patient presents with  . Follow-up    she has been coughing more since developed a cold late Nov 2019. She has occ PND.   Dyspnea:  Not limited by breathing from desired activities  / walks puppy / limited by R plantar faciitis Cough: only when sense of pnds Sleeping: lying flat/ about half the nights, wakes with need to clear throat / does not remember whether bed blocks bed SABA use: none 02: none  Still using mints and multiple antihistamines  rec Try chlortrimeton 4 mg x 2 right at bedtime to see if you sleep better with less night time drainage  Pick zyrtec and take as a maintenance each am  Ok to try off gabapentin to see if helps Keep the candy handy - no peppermint!     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 Sinsusitis/ pnds with pseuedowheezing  Chief Complaint  Patient presents with  . Acute Visit    Pt c/o cough  with brown to green sputum and wheezing for the past several days.   had stopped gabapentin but restarted 100mg  3 x daily though numbers don't add up as last bottle says refill was 10/03/18 and is full ("I add the new to the old" and have two homes with bottles in each) Acutely worse p husband's "cold"01/22/19 with nasal congestion / nose running with purulent secretions and sputum  but no fever and worse cough 01/24/19 with subjective wheeze but no fever > rx mucinex dm 1200 mg every 12 hours but no flutter valve  Tried albuterol in past and didn't work and symbicort made cough def worse  No sob at rest unless during coughing fits rec When coughing for any reason  >  nexium Take 30- 60 min before your first and last meals of the day  When nose dripping for any reason/  throat tickle try(instead of Zyrtec)  take CHLORPHENIRAMINE  4 mg - take one every 4 hours as needed - available over the counter- may cause drowsiness so start with just a bedtime dose or two and see how you tolerate it before trying in daytime  And also can use atrovent nasal spray as needed Prednisone 10 mg take  4 each am x 2 days,   2 each am x 2 days,  1 each am x 2 days and stop  Bactrim DS one twice daily x 10 days should turn the mucus back to clear GERD (REFLUX)  is an extremely common cause of respiratory symptoms just like yours , many times with no obvious heartburn at all.    08/18/2019  f/u ov/Jeena Arnett re:  Recurrent cough/ maint on nexium q d  / zyrtec / not using 1st gen H1 blockers per guidelines   Chief Complaint  Patient presents with  . Follow-up    She has had non prod cough x 3 days.   Dyspnea:  Not limited by breathing from desired activities   Cough: typically dry,  lasts 4-5 days and seems to go away "on its own"  Sleeping: some  at hs then resolves noct and then recurs after stirring in am but really never disturbs sleep  SABA use: none 02: none    No obvious day to day or daytime variability or assoc  excess/ purulent sputum or mucus plugs or hemoptysis or cp or chest tightness, subjective wheeze or overt sinus or hb symptoms.   Sleeping as above without nocturnal  exacerbation  of respiratory  c/o's or need for noct saba. Also denies any obvious fluctuation of symptoms with weather or environmental changes or other aggravating or alleviating factors except as outlined above   No unusual exposure hx or h/o childhood pna/ asthma or knowledge of premature birth.  Current Allergies, Complete Past Medical History, Past Surgical History, Family History, and Social History were reviewed in [employer] record.  ROS  The following are not active complaints unless bolded Hoarseness, sore throat, dysphagia, dental problems, itching, sneezing,  nasal congestion or discharge of excess mucus or purulent secretions, ear ache,   fever, chills, sweats, unintended wt loss or wt gain, classically pleuritic or exertional cp,  orthopnea pnd or arm/hand swelling  or leg swelling, presyncope, palpitations, abdominal pain, anorexia, nausea, vomiting, diarrhea  or change in bowel habits or change in bladder habits, change in stools or change in urine, dysuria, hematuria,  rash, arthralgias, visual complaints, headache, numbness, weakness or ataxia or problems with walking or coordination,  change in mood or  memory.        Current Meds  Medication Sig  . Biotin 5000 MCG TABS Take by mouth daily.  . calcium carbonate (TUMS - DOSED IN MG ELEMENTAL CALCIUM) 500 MG chewable tablet Chew 1 tablet by mouth as needed for indigestion or heartburn.  . Calcium-Magnesium-Vitamin D (CALCIUM 500 PO) Take by mouth 2 (two) times daily.  . cetirizine (ZYRTEC) 10 MG tablet Take 10 mg by mouth as needed for allergies.  . cycloSPORINE (RESTASIS) 0.05 % ophthalmic emulsion Apply to eye.  . diclofenac sodium (VOLTAREN) 1 % GEL Apply 2 grams to 4 grams to affected to area up to 4 times daily as needed  . EPIPEN 2-PAK  0.3 MG/0.3ML SOAJ injection use as directed for INJECTION  . esomeprazole (NEXIUM) 40 MG capsule TAKE 1 CAPSULE BY MOUTH TWICE DAILY BEFORE MEAL(S)  . finasteride (PROSCAR) 5 MG tablet Take 2.5 mg by mouth daily.  Marland Kitchen gabapentin (NEURONTIN) 100 MG capsule Take 100 mg by mouth as needed.   . Magnesium 400 MG CAPS Take by mouth daily.  . methocarbamol (ROBAXIN) 500 MG tablet Take 500 mg by mouth as needed for muscle spasms.  . naproxen sodium (ANAPROX) 220 MG tablet Take by mouth as needed.   . Omega-3 Fatty Acids (FISH OIL) 1000 MG CAPS Take by mouth daily.  . Probiotic Product (PROBIOTIC PO) Take by mouth daily.  Marland Kitchen Respiratory Therapy Supplies (FLUTTER) DEVI Use as directed (Patient taking differently: as needed. Use as directed)  . sertraline (ZOLOFT) 50 MG tablet   . TURMERIC PO Herbal Name: Tumeric  . VITAMIN D PO Take 25,000 Units by mouth 3 (three) times a week.                 Objective:   Physical Exam  amb wf nad/ occ throat clearing    08/18/2019     196  01/27/2019        193  11/06/2018          190  08/04/2018  191  06/23/2018        188  05/15/2018        191  04/01/2018        187  02/27/2018        190  01/30/2018        193   12/20/17 199 lb 3.2 oz (90.4 kg)  04/17/17 201 lb (91.2 kg)      Vital signs reviewed - Note on arrival 02 sats  97% on RA      HEENT : pt wearing mask not removed for exam due to covid -19 concerns.    NECK :  without JVD/Nodes/TM/ nl carotid upstrokes bilaterally   LUNGS: no acc muscle use,  Nl contour chest which is clear to A and P bilaterally without cough on insp or exp maneuvers   CV:  RRR  no s3 or murmur or increase in P2, and no edema   ABD:  soft and nontender with nl inspiratory excursion in the supine position. No bruits or organomegaly appreciated, bowel sounds nl  MS:  Nl gait/ ext warm without deformities, calf tenderness, cyanosis or clubbing No obvious joint restrictions   SKIN: warm and dry without lesions     NEURO:  alert, approp, nl sensorium with  no motor or cerebellar deficits apparent.              Assessment:

## 2019-08-21 ENCOUNTER — Encounter: Payer: Self-pay | Admitting: Internal Medicine

## 2019-08-21 NOTE — Assessment & Plan Note (Signed)
Onset 2016   Susan Carpenter eval around 2014 eval Pos to trees/ pollen/ mold/ dust and shots x 2 years but  Had severe skin reactions to shots so they were stopped with no  Difference in symptoms  Singulair failed 2017-18 Spirometry 12/20/2017  wnl x for effort dep portion of f/v loop  - max gerd rx plus 1st gen H1 blockers per guidelines  12/20/2017 >>>did not use latter - CT sinus 01/01/2018  Ok  - FENO 01/30/2018  =   13  - 01/30/2018  After extensive coaching inhaler device  effectiveness =    75% try symbicort 80 2bid > cough so stopped - Allergy profile 02/27/2018 >  Eos 0.3 /  IgE 74  RAST Dust > ragweed  - 03/07/18 MCT neg  - Cylcical cough rx 04/01/2018  Saw allergy again 05/14/18 > neg skin testing x for dust >>   pnds improved with atrovent NS - HRCT 05/28/2018 >>>   Neg ILD/ mod HH  - 06/23/2018 trial of gabapentin 100 tid improved but still globus sensation so rec 100 qid > better and wanted trial off 11/06/2018 but rec max rx with 1st gen H1 blockers per guidelines  And then  try wean to  Off> restarted around 1st of march 2020 for "allergies" with pnds / did not try 1st gen H1 blockers per guidelines  Or atrovent  - 01/27/2019 retrained on purse lip breathing for "wheeze" and flutter valve in setting of "wheezing flare"   Not following contingencies for flare of cough but overall this is more of a nuisance cough than anything and she has not established a cause /effect that she can identify either for trigger or relief but should try the basic steps first then add back gabapentin up to 100 qid before calling clinic for additional instructions   I had an extended discussion with the patient reviewing all relevant studies completed to date and  lasting 15 to 20 minutes of a 25 minute visit    Each maintenance medication was reviewed in detail including most importantly the difference between maintenance and prns and under what circumstances the prns are to be triggered using an action plan format  that is not reflected in the computer generated alphabetically organized AVS.     Please see AVS for specific instructions unique to this visit that I personally wrote and verbalized to the the pt in detail and then reviewed with pt  by my nurse highlighting any  changes in therapy recommended at today's visit to their plan of care.

## 2019-10-08 ENCOUNTER — Other Ambulatory Visit: Payer: Self-pay | Admitting: Rheumatology

## 2019-10-08 DIAGNOSIS — G4709 Other insomnia: Secondary | ICD-10-CM

## 2019-10-08 DIAGNOSIS — M797 Fibromyalgia: Secondary | ICD-10-CM

## 2019-11-23 ENCOUNTER — Telehealth: Payer: Self-pay

## 2019-11-23 NOTE — Telephone Encounter (Signed)
PA request received from [facility] pharmacy Drug requested: esomeprazole 40mg  CMM Key: [id number] PA request has been approved from 11/06/2019-11/04/2020.  Pharmacy aware. Nothing further needed at this time- will close encounter.

## 2019-12-18 IMAGING — DX DG CHEST 2V
2 series · 2 of 2 positions shown · non-contrast
Comparison: 12/20/2017.

CLINICAL DATA: Cough variant asthma.

EXAM:
CHEST - 2 VIEW

[chest pa]
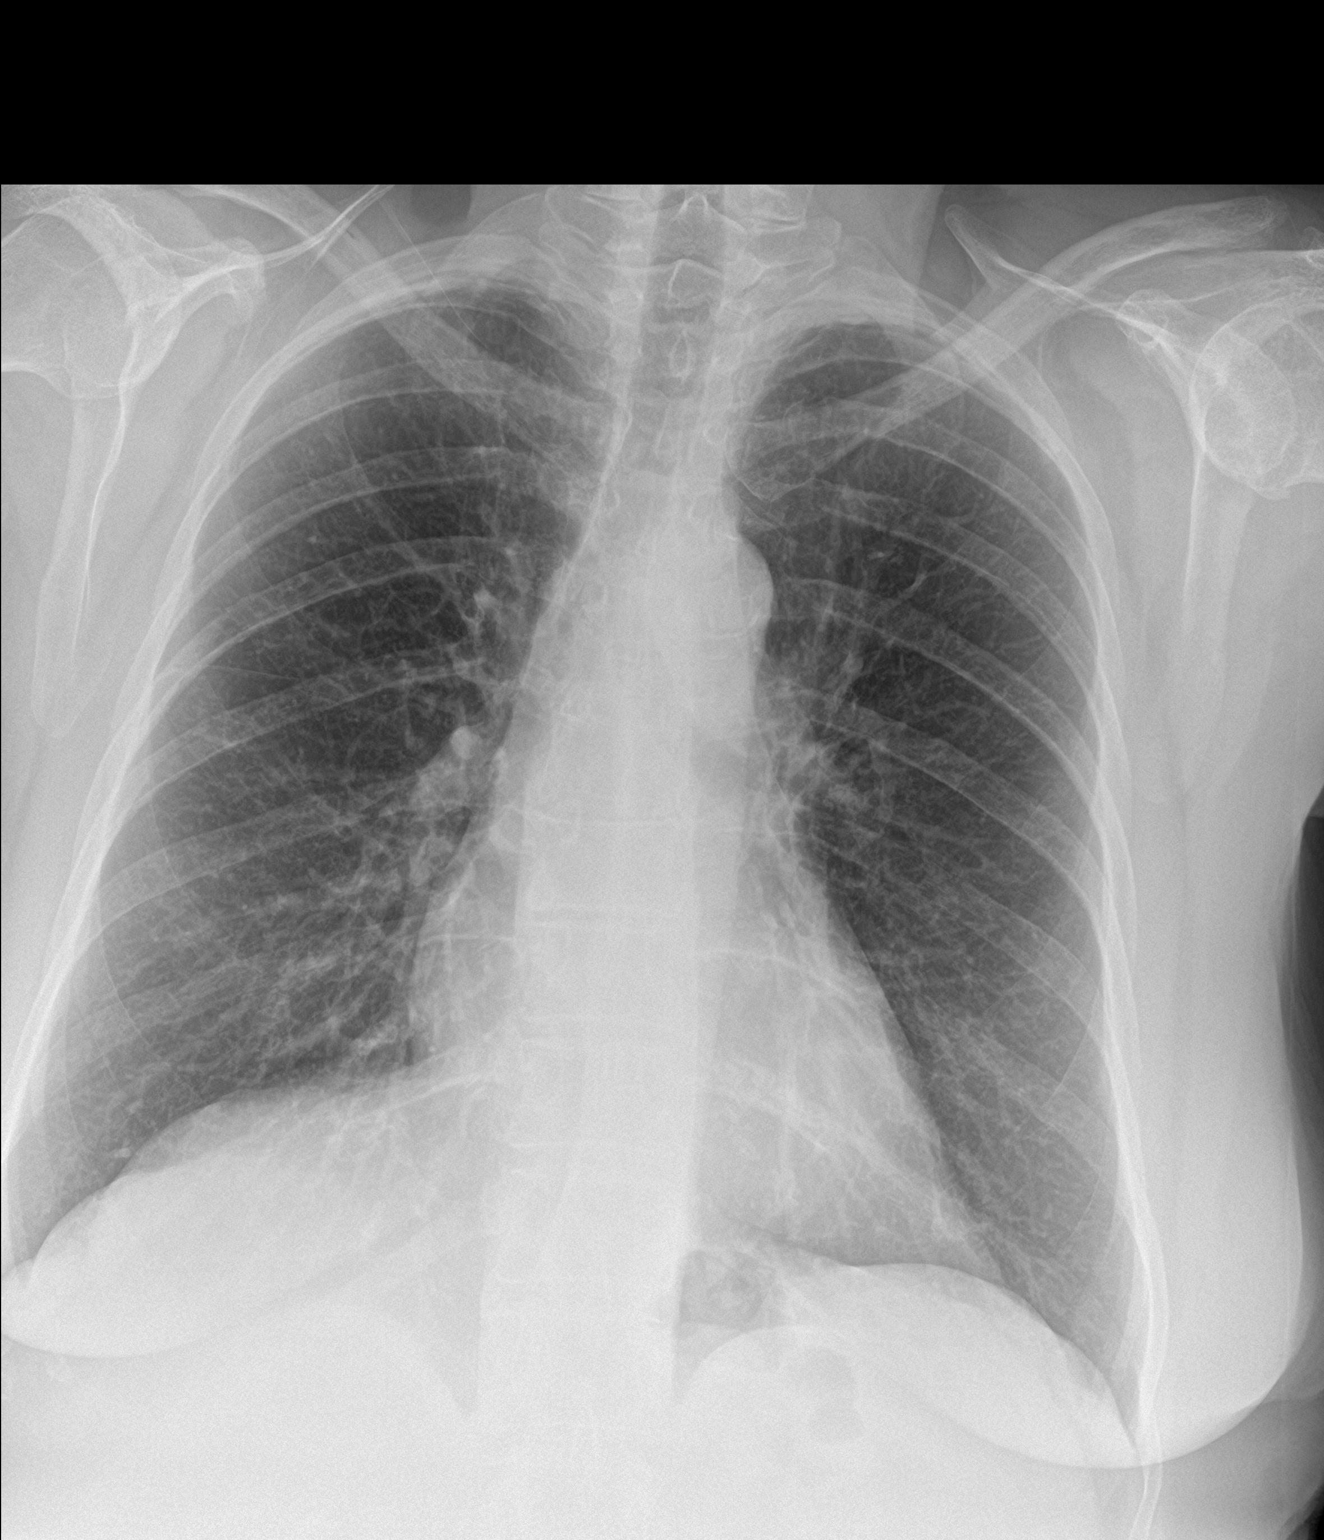

[chest lat]
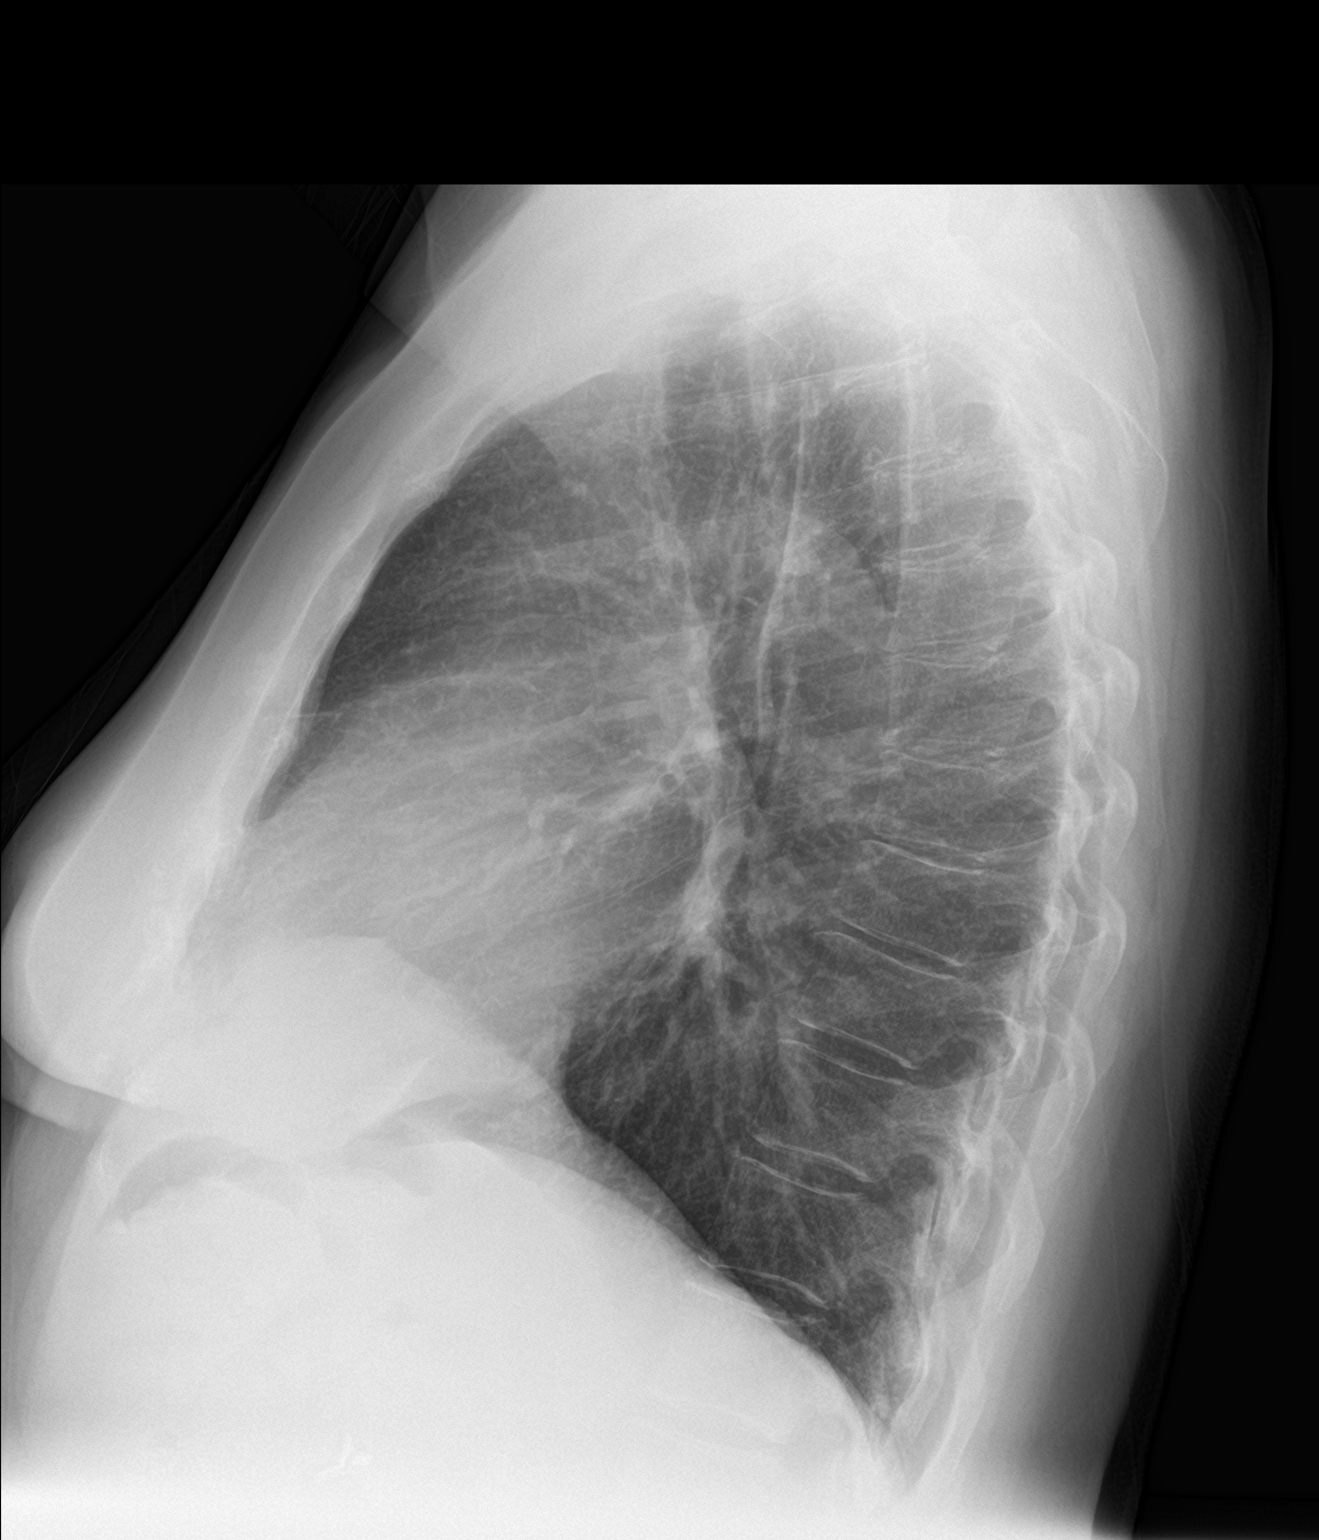

[2 of 2 positions shown; findings below may reference images not displayed]

FINDINGS: The heart size and mediastinal contours are within normal limits.
Both lungs are clear. The visualized skeletal structures are
unremarkable.
IMPRESSION: No active cardiopulmonary disease.  Stable chest.

## 2020-02-10 DIAGNOSIS — L661 Lichen planopilaris: Secondary | ICD-10-CM | POA: Diagnosis not present

## 2020-02-15 ENCOUNTER — Ambulatory Visit: Payer: Medicare PPO | Admitting: Podiatry

## 2020-02-15 ENCOUNTER — Ambulatory Visit (INDEPENDENT_AMBULATORY_CARE_PROVIDER_SITE_OTHER): Payer: Medicare PPO

## 2020-02-15 ENCOUNTER — Other Ambulatory Visit: Payer: Self-pay

## 2020-02-15 DIAGNOSIS — M7752 Other enthesopathy of left foot: Secondary | ICD-10-CM

## 2020-02-15 DIAGNOSIS — G5792 Unspecified mononeuropathy of left lower limb: Secondary | ICD-10-CM | POA: Diagnosis not present

## 2020-02-15 DIAGNOSIS — M7751 Other enthesopathy of right foot: Secondary | ICD-10-CM

## 2020-02-15 MED ORDER — DICLOFENAC SODIUM 75 MG PO TBEC: 75.0000 mg | DELAYED_RELEASE_TABLET | Freq: Two times a day (BID) | ORAL | 1 refills | Status: DC

## 2020-02-21 NOTE — Progress Notes (Signed)
   HPI: 74 y.o. female presenting today with a chief complaint of gradually worsening sharp pain to the balls of the bilateral feet that began about two years ago. She reports associated pain to the left dorsal midfoot. Walking increases the pain. She has tried wearing new shoes and orthotics for treatment. Patient is here for further evaluation and treatment.   Past Medical History:  Diagnosis Date  . Allergy    rhinitis  . ANA positive   . Arthritis   . Complication of anesthesia   . Constipation   . Fibromyalgia   . GERD (gastroesophageal reflux disease)   . Heart murmur   . Helicobacter pylori (H. pylori) infection    s/p treatment  . Hiatal hernia   . Interstitial cystitis   . Pain in joint    site unspecified  . PONV (postoperative nausea and vomiting)   . Psoriasis   . Psoriatic arthritis [facility])      Physical Exam: General: The patient is alert and oriented x3 in no acute distress.  Dermatology: Skin is warm, dry and supple bilateral lower extremities. Negative for open lesions or macerations.  Vascular: Palpable pedal pulses bilaterally. No edema or erythema noted. Capillary refill within normal limits.  Neurological: Epicritic and protective threshold intact.  Paresthesia with burning, shooting pain noted to the left dorsal foot.   Musculoskeletal Exam: Pain with palpation noted to the 4th MPJ of the right foot. Range of motion within normal limits to all pedal and ankle joints bilateral. Muscle strength 5/5 in all groups bilateral.   Radiographic Exam:  Normal osseous mineralization. Joint spaces preserved. No fracture/dislocation/boney destruction.    Assessment: 1. 4th MPJ capsulitis right  2. Neuritis left dorsal foot    Plan of Care:  1. Patient evaluated. X-Rays reviewed.  2. Injection of 0.5 mLs Celestone Soluspan injected into the left dorsal foot.  3. Injection of 0.5 mLs Celestone Soluspan injected into the 4th MPJ of the right foot.  4. Prescription  for Diclofenac provided to patient. 5. Met pads applied to insole of right shoe to offload the forefoot.  6. Recommended new shoes from [employer].  7. Return to clinic in 4 weeks.       Edrick Kins, DPM [facility]  Dr. Edrick Kins, DPM    [address], [place] [address]                Office [phone]  Fax [phone]

## 2020-02-23 ENCOUNTER — Ambulatory Visit: Payer: Medicare Other | Admitting: Internal Medicine

## 2020-03-10 DIAGNOSIS — H5203 Hypermetropia, bilateral: Secondary | ICD-10-CM | POA: Diagnosis not present

## 2020-03-10 DIAGNOSIS — H04123 Dry eye syndrome of bilateral lacrimal glands: Secondary | ICD-10-CM | POA: Diagnosis not present

## 2020-03-10 DIAGNOSIS — Z79899 Other long term (current) drug therapy: Secondary | ICD-10-CM | POA: Diagnosis not present

## 2020-03-16 ENCOUNTER — Ambulatory Visit: Payer: Medicare PPO | Admitting: Podiatry

## 2020-03-16 ENCOUNTER — Other Ambulatory Visit: Payer: Self-pay

## 2020-03-16 DIAGNOSIS — M7751 Other enthesopathy of right foot: Secondary | ICD-10-CM | POA: Diagnosis not present

## 2020-03-16 DIAGNOSIS — M2580 Other specified joint disorders, unspecified joint: Secondary | ICD-10-CM | POA: Diagnosis not present

## 2020-03-20 NOTE — Progress Notes (Signed)
   HPI: 74 y.o. female presenting today for follow up evaluation of bilateral foot pain. She states the left foot pain has resolved completely. She states the right foot pain is now located medially. She reports relief after receiving the injections at her last appointment. She was taking Diclofenac but stopped due to side effects of heartburn. Being on the foot for long periods of time increases the pain. Patient is here for further evaluation and treatment.   Past Medical History:  Diagnosis Date  . Allergy    rhinitis  . ANA positive   . Arthritis   . Complication of anesthesia   . Constipation   . Fibromyalgia   . GERD (gastroesophageal reflux disease)   . Heart murmur   . Helicobacter pylori (H. pylori) infection    s/p treatment  . Hiatal hernia   . Interstitial cystitis   . Pain in joint    site unspecified  . PONV (postoperative nausea and vomiting)   . Psoriasis   . Psoriatic arthritis [facility])      Physical Exam: General: The patient is alert and oriented x3 in no acute distress.  Dermatology: Skin is warm, dry and supple bilateral lower extremities. Negative for open lesions or macerations.  Vascular: Palpable pedal pulses bilaterally. No edema or erythema noted. Capillary refill within normal limits.  Neurological: Epicritic and protective threshold grossly intact bilaterally.   Musculoskeletal Exam: Pain with palpation to the right sesamoid apparatus. Range of motion within normal limits to all pedal and ankle joints bilateral. Muscle strength 5/5 in all groups bilateral.   Radiographic Exam:  Normal osseous mineralization. Joint spaces preserved. No fracture/dislocation/boney destruction.    Assessment: 1. 4th MPJ capsulitis right - resolved  2. Neuritis left dorsal foot - resolved  3. Sesamoiditis right    Plan of Care:  1. Patient evaluated.   2. Discontinued taking Diclofenac due to history of GERD.  3. Injection of 0.5 mLs of Celestone Soluspan injected  into the sesamoidal apparatus. 4. Post op shoe dispensed.  5. Return to clinic in 4 weeks.      Felecia Shelling, DPM [facility]  Dr. Felecia Shelling, DPM    [address] [place] [place], [place] [address]                Office [phone]  Fax [phone]

## 2020-04-12 DIAGNOSIS — H04121 Dry eye syndrome of right lacrimal gland: Secondary | ICD-10-CM | POA: Diagnosis not present

## 2020-04-12 DIAGNOSIS — H04123 Dry eye syndrome of bilateral lacrimal glands: Secondary | ICD-10-CM | POA: Diagnosis not present

## 2020-04-12 DIAGNOSIS — H04122 Dry eye syndrome of left lacrimal gland: Secondary | ICD-10-CM | POA: Diagnosis not present

## 2020-04-13 ENCOUNTER — Encounter: Payer: Self-pay | Admitting: Podiatry

## 2020-04-13 ENCOUNTER — Other Ambulatory Visit: Payer: Self-pay

## 2020-04-13 ENCOUNTER — Ambulatory Visit: Payer: Medicare PPO | Admitting: Podiatry

## 2020-04-13 DIAGNOSIS — M7751 Other enthesopathy of right foot: Secondary | ICD-10-CM

## 2020-04-13 MED ORDER — METHYLPREDNISOLONE 4 MG PO TBPK
ORAL_TABLET | ORAL | 0 refills | Status: DC
Start: 2020-04-13 — End: 2020-07-04

## 2020-04-13 NOTE — Progress Notes (Signed)
° °  HPI: 74 y.o. female presenting today for follow-up evaluation regarding right first MTPJ pain.  Last visit on 03/16/2020 she received an injection of the sesamoidal apparatus which she states only gave her minimal relief.  She states that now the pain is on the dorsal aspect of the big toe joint of the right foot.  Her left foot continues to do very well without any pain or tenderness.  She is currently only taking Tylenol for the pain due to history of GERD.  She presents for further treatment evaluation  Past Medical History:  Diagnosis Date   Allergy    rhinitis   ANA positive    Arthritis    Complication of anesthesia    Constipation    Fibromyalgia    GERD (gastroesophageal reflux disease)    Heart murmur    Helicobacter pylori (H. pylori) infection    s/p treatment   Hiatal hernia    Interstitial cystitis    Pain in joint    site unspecified   PONV (postoperative nausea and vomiting)    Psoriasis    Psoriatic arthritis (HCC)      Physical Exam: General: The patient is alert and oriented x3 in no acute distress.  Dermatology: Skin is warm, dry and supple bilateral lower extremities. Negative for open lesions or macerations.  Vascular: Palpable pedal pulses bilaterally. No edema or erythema noted. Capillary refill within normal limits.  Neurological: Epicritic and protective threshold grossly intact bilaterally.   Musculoskeletal Exam: Range of motion within normal limits to all pedal and ankle joints bilateral. Muscle strength 5/5 in all groups bilateral.  Today there is tenderness to palpation over the dorsal lateral aspect of the first MTPJ of the right foot.  Negative for any significant pain on palpation to the sesamoidal apparatus  Assessment: 1.  First MTPJ capsulitis right   Plan of Care:  1. Patient evaluated.  2.  Injection of 0.5 cc Celestone Soluspan injected into the dorsal lateral aspect of the first MTPJ right foot 3.  Continue good  supportive shoes with OTC insoles/arch supports 4.  Today we did discuss the new boating shoes [employer].  Patient would like to purchase a pair and try them 5.  Prescription for Medrol Dosepak 6.  Patient cannot take NSAIDs due to history of GERD 7.  Return to clinic as needed      Felecia Shelling, DPM [facility]  Dr. Felecia Shelling, DPM    [address] [place], [place] [address]                Office [phone]  Fax [phone]

## 2020-05-24 DIAGNOSIS — H04121 Dry eye syndrome of right lacrimal gland: Secondary | ICD-10-CM | POA: Diagnosis not present

## 2020-05-31 ENCOUNTER — Ambulatory Visit: Payer: Medicare Other | Admitting: Internal Medicine

## 2020-06-12 DIAGNOSIS — J101 Influenza due to other identified influenza virus with other respiratory manifestations: Secondary | ICD-10-CM | POA: Diagnosis not present

## 2020-06-12 DIAGNOSIS — R05 Cough: Secondary | ICD-10-CM | POA: Diagnosis not present

## 2020-06-30 NOTE — Progress Notes (Signed)
Office Visit Note  Patient: Susan Carpenter             Date of Birth: Apr 03, 1946           MRN: [id number]             PCP: Leighton Ruff, MD Referring: Leighton Ruff, MD Visit Date: 07/12/2020 Occupation: @GUAROCC @  Subjective:  Other (right foot pain )   History of Present Illness: Susan Carpenter is a 74 y.o. female with history of osteoarthritis, degenerative disc disease and fibromyalgia syndrome.  She states she continues to have a lot of discomfort in her feet.  She was seen by her podiatrist who made some orthotics and support.  She is concerned that she may not be able to walk due to foot pain in the future.  She continues to have some discomfort in her hands and also difficulty holding objects.  She has been taking Plaquenil for alopecia.  She also suffers from dry mouth and dry eyes.  Activities of Daily Living:  Patient reports morning stiffness for several  hours.   Patient Reports nocturnal pain.  Difficulty dressing/grooming: Denies Difficulty climbing stairs: Reports Difficulty getting out of chair: Reports Difficulty using hands for taps, buttons, cutlery, and/or writing: Reports  Review of Systems  Constitutional: Positive for fatigue.  HENT: Positive for mouth dryness. Negative for mouth sores and nose dryness.   Eyes: Positive for dryness.  Respiratory: Positive for cough. Negative for shortness of breath and difficulty breathing.   Cardiovascular: Negative for chest pain and palpitations.  Gastrointestinal: Negative for blood in stool, constipation and diarrhea.  Endocrine: Negative for increased urination.  Genitourinary: Negative for difficulty urinating.  Musculoskeletal: Positive for arthralgias, joint pain, joint swelling, myalgias, morning stiffness, muscle tenderness and myalgias.  Skin: Positive for rash. Negative for color change.  Allergic/Immunologic: Negative for susceptible to infections.  Neurological: Positive for numbness, headaches  and weakness. Negative for dizziness and memory loss.  Hematological: Positive for bruising/bleeding tendency.  Psychiatric/Behavioral: Negative for confusion.    PMFS History:  Patient Active Problem List   Diagnosis Date Noted   Status post left rotator cuff repair 10/01/2018   History of total knee replacement, right 10/01/2018   DDD (degenerative disc disease), cervical 10/01/2018   DDD (degenerative disc disease), lumbar 10/01/2018   Cough variant asthma vs uacs 02/02/2018   Chronic cough 12/20/2017   Other insomnia 01/15/2017   Other fatigue 01/15/2017   Trapezius muscle spasm 01/15/2017   Alopecia, scarring 09/21/2016   Primary osteoarthritis of both hands 09/19/2016   OA (osteoarthritis) of knee 08/01/2015   INTERSTITIAL CYSTITIS 01/31/2010   Fibromyalgia 01/31/2010   FATIGUE 01/31/2010   DYSPNEA/SHORTNESS OF BREATH 01/31/2010   FREQUENCY, URINARY 01/31/2010   Depression 01/24/2009   Migraines 40/08/2724   HELICOBACTER PYLORI INFECTION 10/14/2007   Allergic rhinitis 10/14/2007   HIATAL HERNIA 10/14/2007   ANA POSITIVE 10/14/2007   Hypothyroidism 10/10/2007   PAIN IN JOINT, SITE UNSPECIFIED 10/10/2007   WEIGHT GAIN 10/10/2007    Past Medical History:  Diagnosis Date   Allergy    rhinitis   ANA positive    Arthritis    Complication of anesthesia    Constipation    Fibromyalgia    Frontal fibrosing alopecia    Dr. Irish Elders    GERD (gastroesophageal reflux disease)    Heart murmur    Helicobacter pylori (H. pylori) infection    s/p treatment   Hiatal hernia    Interstitial cystitis  Pain in joint    site unspecified   PONV (postoperative nausea and vomiting)    Psoriasis    Psoriatic arthritis ([place])     Family History  Problem Relation Age of Onset   Diabetes Father    Heart disease Father        MI   Alcohol abuse Father    Cancer Mother        COLON    Mental illness Mother        ALZHEIMERS;  DEPRESSION   Thyroid disease Mother        HYPOTHYROID   Alcohol abuse Maternal Uncle    Diabetes Maternal Uncle    Hypertension Maternal Grandmother    Stroke Maternal Grandmother    Past Surgical History:  Procedure Laterality Date   BLADDER SURGERY     x2 for scar tissue   CHOLECYSTECTOMY  1995   cysts on ovaries     polyp removal  1984   from fallopian tube   ROTATOR CUFF REPAIR Left 06/2017   THUMB SURGERY Right 01/16/2016   TOTAL KNEE ARTHROPLASTY Right 08/01/2015   Procedure: RIGHT TOTAL KNEE ARTHROPLASTY;  Surgeon: Gaynelle Arabian, MD;  Location: [facility];  Service: Orthopedics;  Laterality: Right;   TUBAL LIGATION     Social History   Social History Narrative   Not on file   Immunization History  Administered Date(s) Administered   Influenza Whole 08/05/2004   Influenza, High Dose Seasonal PF 08/04/2018   Influenza-Unspecified 08/05/2013   PFIZER SARS-COV-2 Vaccination 12/04/2019, 12/26/2019   Td 07/06/2004     Objective: Vital Signs: BP 132/79 (BP Location: Left Arm, Patient Position: Sitting, Cuff Size: Normal)    Pulse 78    Resp 15    Ht 5' 4"  (1.626 m)    Wt 179 lb (81.2 kg)    BMI 30.73 kg/m    Physical Exam Vitals and nursing note reviewed.  Constitutional:      Appearance: She is well-developed.  HENT:     Head: Normocephalic and atraumatic.  Eyes:     Conjunctiva/sclera: Conjunctivae normal.  Cardiovascular:     Rate and Rhythm: Normal rate and regular rhythm.     Heart sounds: Normal heart sounds.  Pulmonary:     Effort: Pulmonary effort is normal.     Breath sounds: Normal breath sounds.  Abdominal:     General: Bowel sounds are normal.     Palpations: Abdomen is soft.  Musculoskeletal:     Cervical back: Normal range of motion.  Lymphadenopathy:     Cervical: No cervical adenopathy.  Skin:    General: Skin is warm and dry.     Capillary Refill: Capillary refill takes less than 2 seconds.  Neurological:     Mental  Status: She is alert and oriented to person, place, and time.  Psychiatric:        Behavior: Behavior normal.      Musculoskeletal Exam: She has limited range of motion of cervical and lumbar spine.  Left shoulder joint was limited range of motion.  She has severe PIP and DIP thickening and some inflammatory changes in her PIP joints.  No DIP inflammation was noted.  No nail pitting was noted.  Hip joints were in good range of motion.  Her right knee joint is replaced.  She has bilateral pes cavus with the dorsal spurring and some PIP and DIP thickening with no synovitis.  CDAI Exam: CDAI Score: -- Patient Global: --; Provider Global: --  Swollen: --; Tender: -- Joint Exam 07/12/2020   No joint exam has been documented for this visit   There is currently no information documented on the homunculus. Go to the Rheumatology activity and complete the homunculus joint exam.  Investigation: No additional findings.  Imaging: DG Chest 2 View  Result Date: 07/04/2020 CLINICAL DATA:  Chronic cough EXAM: CHEST - 2 VIEW COMPARISON:  04/11/2018 FINDINGS: The heart size and mediastinal contours are within normal limits. Both lungs are clear. The visualized skeletal structures are unremarkable. IMPRESSION: No active cardiopulmonary disease. Electronically Signed   By: Donavan Foil M.D.   On: 07/04/2020 22:27    Recent Labs: Lab Results  Component Value Date   WBC 6.5 02/27/2018   HGB 13.7 02/27/2018   PLT 227.0 02/27/2018   NA 140 01/15/2017   K 4.6 01/15/2017   CL 106 01/15/2017   CO2 25 01/15/2017   GLUCOSE 96 01/15/2017   BUN 17 01/15/2017   CREATININE 0.82 01/15/2017   BILITOT 0.3 01/15/2017   ALKPHOS 82 01/15/2017   AST 22 01/15/2017   ALT 22 01/15/2017   PROT 6.4 01/15/2017   ALBUMIN 4.0 01/15/2017   CALCIUM 9.2 01/15/2017   GFRAA 84 01/15/2017  June 19, 2013 HLA-B27 was negative. May 28, 2018 high-resolution CT done by Dr. Melvyn Novas did not show ILD. February 15, 2020 x-rays done  by Dr. Amalia Hailey were reviewed which were consistent with osteoarthritis.   Speciality Comments: No specialty comments available.  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  Over-the-counter products were discussed.  She is also using Restasis prescribed by her ophthalmologist.  Her serology has been negative in the past.  I will check labs again today.  I believe the symptoms could be related to the medications.  Plan: Rheumatoid factor, ANA, Sjogrens syndrome-A extractable nuclear antibody, Sjogrens syndrome-B extractable nuclear antibody, C3 and C4   Fibromyalgia-she continues to have generalized pain and discomfort.  Other fatigue -most likely related to fibromyalgia and insomnia.  Plan: CBC with Differential/Platelet, COMPLETE METABOLIC PANEL WITH GFR  Other insomnia-good sleep hygiene was discussed.  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  I will obtain x-rays at the next visit per patient's request.  HLA-B27 was negative in the past.  Status post left rotator cuff repair-she has limited range of motion.  History of total knee replacement, right-doing well with chronic pain.  Primary osteoarthritis of both feet-she has bilateral dorsal spurring and pes cavus.  She has seen podiatrist.  I reviewed x-rays done by the podiatrist which was consistent with osteoarthritis.  X-ray findings were reviewed with the patient.  DDD (degenerative disc disease), cervical-she has chronic discomfort in her cervical region.  DDD (degenerative disc disease), lumbar-she has chronic lower back pain.  Chronic cough-I reviewed her high-resolution CT which was negative for ILD.  She has been followed by Dr. Melvyn Novas.  Patient  states she has been told that her cough is related to reflux.  She has been intermittently treated with prednisone and antibiotics.  History of hypothyroidism  History of gastroesophageal reflux (GERD)  History of depression  Interstitial cystitis  Education regarding COVID-19 infection was provided.  Information was placed in the AVS.    Orders: Orders Placed This Encounter  Procedures   CBC with Differential/Platelet  COMPLETE METABOLIC PANEL WITH GFR   Rheumatoid factor   ANA   Sjogrens syndrome-A extractable nuclear antibody   Sjogrens syndrome-B extractable nuclear antibody   C3 and C4   Lactate Dehydrogenase (LDH)   No orders of the defined types were placed in this encounter.    Follow-Up Instructions: Return in about 3 months (around 10/11/2020) for Osteoarthritis, FMS.   Bo Merino, MD  Note - This record has been created using Editor, commissioning.  Chart creation errors have been sought, but may not always  have been located. Such creation errors do not reflect on  the standard of medical care.

## 2020-07-04 ENCOUNTER — Encounter: Payer: Self-pay | Admitting: Internal Medicine

## 2020-07-04 ENCOUNTER — Other Ambulatory Visit: Payer: Self-pay

## 2020-07-04 ENCOUNTER — Ambulatory Visit: Payer: Medicare Other | Admitting: Internal Medicine

## 2020-07-04 ENCOUNTER — Ambulatory Visit (INDEPENDENT_AMBULATORY_CARE_PROVIDER_SITE_OTHER): Payer: Medicare PPO

## 2020-07-04 DIAGNOSIS — R053 Chronic cough: Secondary | ICD-10-CM

## 2020-07-04 DIAGNOSIS — R05 Cough: Secondary | ICD-10-CM | POA: Diagnosis not present

## 2020-07-04 MED ORDER — GABAPENTIN 100 MG PO CAPS: 100.0000 mg | ORAL_CAPSULE | Freq: Four times a day (QID) | ORAL | 2 refills | Status: DC

## 2020-07-04 MED ORDER — PREDNISONE 10 MG PO TABS: ORAL_TABLET | ORAL | 0 refills | Status: DC

## 2020-07-04 MED ORDER — PROMETHAZINE-DM 6.25-15 MG/5ML PO SYRP
5.0000 mL | ORAL_SOLUTION | Freq: Four times a day (QID) | ORAL | 0 refills | Status: DC | PRN
Start: 2020-07-04 — End: 2020-07-12

## 2020-07-04 NOTE — Progress Notes (Signed)
Subjective:     Patient ID: Susan Carpenter, female   DOB: Jul 22, 1946,   MRN: [id number]     Brief patient profile:  52 yowf  never smoker with "allergies all her life"manifested by ? HA/some runny nose and pos w/u by Dr Irena Cords around 2014 eval Pos to trees/ pollen/ mold/ dust and shots x 2 years but  Had severe skin reactions to shots so they were stopped with no  Difference in symptoms per pt on vs off the shots  and varies zyrtec vs clariton and occ benadryl to control the symptoms  Then developed  insidious onset chronic cough x around 2016 so referred to pulmonary clinic 12/20/2017 by Dr Zachery Dauer   History of Present Illness  12/20/2017 1st [facility] office visit/ Intisar Claudio   Chief Complaint  Patient presents with  . Pulmonary Consult    Referred by Dr. Juluis Rainier. Pt c/o cough for 3 yrs. She states it's usually worse in the Winter, but "it's bad in the summer too". She states that the cough starts in the evening and will occ wake her up in the night. She has quite a bit of mucus in the mornings- unsure of color.  She also has occ chest tightness and the urge to clear her throat often.   onset was insidious and pattern variable = it can resolve for a few days at most then recurs and sometimes coughs up as much as a sev tsp esp in am's  Kouffman Reflux v Neurogenic Cough Differentiator Reflux Comments  Do you awaken from a sound sleep coughing violently?                            With trouble breathing? Not violently but sporadic   Do you have choking episodes when you cannot  Get enough air, gasping for air ?              no   Do you usually cough when you lie down into  The bed, or when you just lie down to rest ?                          Sporadic/ uses cough drops   Do you usually cough after meals or eating?         Yes   Do you cough when (or after) you bend over?    Yes   GERD SCORE     Kouffman Reflux v Neurogenic Cough Differentiator Neurogenic   Do you more-or-less  cough all day long? sporadic   Does change of temperature make you cough? no   Does laughing or chuckling cause you to cough? If lots of laugh   Do fumes (perfume, automobile fumes, burned  Toast, etc.,) cause you to cough ?      no   Does speaking, singing, or talking on the phone cause you to cough   ?               No    Neurogenic/Airway score      On nexium 20 mg 30 min before bfast and on multiple high dose oil based supplements  Cough to vomit it past  gen chest discomfort only during coughing fits  On plaquenil for alopecia  Not limited by breathing from desired activities  rec Change nexium to 40 mg Take 30- 60 min before your first and last meals  of the day  For drainage / throat tickle try take CHLORPHENIRAMINE  4 mg - take one every 4 hours as needed - GERD   Please see patient coordinator before you leave today  to schedule sinus ct If not improving >>> Prednisone 10 mg take  4 each am x 2 days,   2 each am x 2 days,  1 each am x 2 days and stop  Please schedule a follow up office visit in 6 weeks, sooner if needed  with all medications /inhalers/ solutions in hand so we can verify exactly what you are taking. This includes all medications from all doctors and over the counters - needs feno on return.    01/30/2018  f/u ov/Davinity Fanara re:  uacs vs cough variant asthma / poor cough control since 2016/ did not bring all meds Chief Complaint  Patient presents with  . Follow-up    Cough has improved some. She occ will produce some sputum but she is unsure of color.  She has been having some HA and fatigue that she relates to her allergies.   Dyspnea:  Not limited by sob  Cough: supper time  And after  Sleep: not waking up  SABA use:  None  rec Go ahead and take the prednisone Symbicort 80 Take 2 puffs first thing in am and then another 2 puffs about 12 hours later.  Work on inhaler technique:    Stop the clariton and zyrtec and try  CHLORPHENIRAMINE  4 mg - take one - two every  4 hours as needed - available over the counter- may cause drowsiness so start with just a bedtime dose or two and see how you tolerate it before trying in daytime      02/27/2018  f/u ov/Eunice Oldaker re: uacs vs cough variant asthma/ poor control since 2016/brought meds but not clear she ever tried h1 as rec last ov  Chief Complaint  Patient presents with  . Follow-up    She tried the symbicort for 1 wk and her cough got worse so she stopped. She states she went to the beach recently and she did not cough as much.   Dyspnea:  Felt fine during the walk x 27 min flat while at the beach    Cough: after supper and at bedtime min mucoid sputum  symbicort made the cough worse  Prednisone may have helped  rec Please remember to go to the lab department downstairs in the basement  for your tests - we will call you with the results when they are available. GERD  Diet  For drainage / throat tickle try take CHLORPHENIRAMINE  4 mg - take one every 4 hours as needed -     MCT neg 03/07/18   04/01/2018  f/u ov/Mylan Lengyel re: cough since 2016 worse x 2 weeks - had improved to point where only cough q 3-4 days  Chief Complaint  Patient presents with  . Acute Visit    Increased cough since 03/16/18- she feels like sputum comes up but she does not cough it out.   Dyspnea:  Mostly related to cough  Cough: severe but never spits any out ever - always swallow, never able to expectorate Feels fine when wakes up then worse as day goes on  gen chest and upper abd discomfort brought on by coughing fits on rec The key to effective treatment for your cough is eliminating the non-stop cycle of cough you're stuck in long enough to let your airway  heal completely and then see if there is anything still making you cough once you stop the cough suppression, but this should take no more than 5 days to figure out Any time you feel the urge to cough, do so into the flutter valve to prevent airway trauma  First take mucinex dm 1200  every 12 hours and supplement if needed with  tramadol 50 mg up to 2 every 4 hours Prednisone 10 mg take  4 each am x 2 days,   2 each am x 2 days,  1 each am x 2 days and stop (this is to eliminate allergies and inflammation from coughing) GERD diet Calcium should be gluconate, no  carbonate form of calcium  Please schedule a follow up office visit in 6 weeks, call sooner if needed with all active medications in hand including over the counters       05/15/2018  f/u ov/Hibba Schram re: cough x 2016   Chief Complaint  Patient presents with  . Follow-up    Cough has improved some, but still present. She still has some green to brown sputum.    Dyspnea:  MMRC1 = can walk nl pace, flat grade, can't hurry or go uphills or steps s sob   Cough: worse mid morning now >>>  A tsp of thick mucus slt green, levaquin did not help (rx 04/07/18 ) not using mucinex dm or flutter as instructed  Sleeping: on either side with one pillow s noct cough  SABA use: none 02: none  Saw allergy [place]  05/14/18 > neg skin testing x for dust >>   pnds improved with atrovent NS rec For cough   >  mucinex dm 1200 mg every 12 hours and use the flutter valve as much as you can schedule HRCT of chest >  Neg bronchiectasis/  Please schedule a follow up office visit in 4  weeks, call sooner if needed with all medications /inhalers/ solutions in hand     06/23/2018  f/u ov/Eleni Frank re: cough x 2016 / did not bring flutter  Chief Complaint  Patient presents with  . Follow-up    4wk f/u for cough. Per patient, cough is getting better. She has a mild ache in the middle of chest.   Dyspnea:  Occurs spontaneously at rest, resolves after a few breaths or a few min, not reproduced with ex  Cough: was doing better until "cough virus"  Mother's day but improving back to baseline now  Sleeping: better on side, no coughing fits at night  SABA use: none  02: none New midline cp typically in am with sense of "chest congestion"   Not with  exertion but not very active Always does better on prednisone  rec Gabapentin 100 mg three times a day  Prednisone 10 mg take  4 each am x 2 days,   2 each am x 2 days,  1 each am x 2 days and stop  Always cough and clear your throat into the flutter valve to prevent throat clearing Ok to stop the mucinex dm  Chlortrimeton 4 mg up to 2 every 4 hours as needed for drainage  Please schedule a follow up office visit in 6 weeks, call sooner if needed      08/04/2018  f/u ov/Jacobs Golab re: cough 2016  /   Throat sensation is still there even on prednisone but cough much better on gabapentin 100 sometimes misses doses Chief Complaint  Patient presents with  . Follow-up  She is coughing less. She does clear her throat some.   Dyspnea:  Not limited by breathing from desired activities   Cough: resolved despite not taking gabapentin consistenly at 100 tid Sleeping: fine p 1st gen H1 blockers per guidelines  At hs SABA use: none 02: none   rec Increase gabapentin 100 mg qid    11/06/2018  f/u ov/Katelinn Justice re: uacs x 2016  Chief Complaint  Patient presents with  . Follow-up    she has been coughing more since developed a cold late Nov 2019. She has occ PND.   Dyspnea:  Not limited by breathing from desired activities  / walks puppy / limited by R plantar faciitis Cough: only when sense of pnds Sleeping: lying flat/ about half the nights, wakes with need to clear throat / does not remember whether bed blocks bed SABA use: none 02: none  Still using mints and multiple antihistamines  rec Try chlortrimeton 4 mg x 2 right at bedtime to see if you sleep better with less night time drainage  Pick zyrtec and take as a maintenance each am  Ok to try off gabapentin to see if helps Keep the candy handy - no peppermint!     01/27/2019 acute extended ov/Deandre Brannan re: flare of UACS in setting of ? Sinsusitis/ pnds with pseuedowheezing  Chief Complaint  Patient presents with  . Acute Visit    Pt c/o cough  with brown to green sputum and wheezing for the past several days.   had stopped gabapentin but restarted 100mg  3 x daily though numbers don't add up as last bottle says refill was 10/03/18 and is full ("I add the new to the old" and have two homes with bottles in each) Acutely worse p husband's "cold"01/22/19 with nasal congestion / nose running with purulent secretions and sputum  but no fever and worse cough 01/24/19 with subjective wheeze but no fever > rx mucinex dm 1200 mg every 12 hours but no flutter valve  Tried albuterol in past and didn't work and symbicort made cough def worse  No sob at rest unless during coughing fits rec When coughing for any reason  >  nexium Take 30- 60 min before your first and last meals of the day  When nose dripping for any reason/  throat tickle try(instead of Zyrtec)  take CHLORPHENIRAMINE  4 mg - take one every 4 hours as needed - available over the counter- may cause drowsiness so start with just a bedtime dose or two and see how you tolerate it before trying in daytime  And also can use atrovent nasal spray as needed Prednisone 10 mg take  4 each am x 2 days,   2 each am x 2 days,  1 each am x 2 days and stop  Bactrim DS one twice daily x 10 days should turn the mucus back to clear GERD (REFLUX)  is an extremely common cause of respiratory symptoms just like yours , many times with no obvious heartburn at all.    08/18/2019  f/u ov/Rayya Yagi re:  Recurrent cough/ maint on nexium q d  / zyrtec / not using 1st gen H1 blockers per guidelines   Chief Complaint  Patient presents with  . Follow-up    She has had non prod cough x 3 days.   Dyspnea:  Not limited by breathing from desired activities   Cough: typically dry,  lasts 4-5 days and seems to go away "on its own"  Sleeping: some  at hs then resolves noct and then recurs after stirring in am but really never disturbs sleep  SABA use: none 02: none  rec When coughing for any reason  >  Nexium Take 30- 60 min  before your first and last meals of the day  When nose dripping for any reason/  throat tickle try(instead of Zyrtec)  take CHLORPHENIRAMINE  4 mg - take one every 4 hours as needed - available over the counter- may cause drowsiness so start with just a bedtime dose or two and see how you tolerate it before trying in daytime  And also can use atrovent nasal spray as needed When cough is not getting better on the above >  then start gabapentin 100 mg up to 4 x daily until cough is gone  Diagnosis is upper airway cough syndrome or irritable larynx syndrome perpetuated by reflux and clearing your throat   07/04/2020 extended acute extended ov/Magnum Lunde re: recurrent cough since 1st of aug 2021 did not follow full action plan Chief Complaint  Patient presents with  . Acute Visit    Bad cough.. tickle in chest, headaches and stuffy nose.   baseline On  nexium 40 mg ac  And resumed the bid immediately  Did not maintain on zyrtec but  with onset tied unsuccessfully x one week then tried 1st gen H1 blockers per guidelines  But never more than one at hs  Was not on gabapentin prior to acute flare and did not have any to take  Never produced any mucus  Tried the mucinex ? Dm and seen Aug 8th rx doxy all better within days then Aug  25th chest congestion then coughing fits rx mucinex dm with 24/7 tickle and dry cough worse at hs   No obvious day to day or daytime variability or assoc excess/ purulent sputum or mucus plugs or hemoptysis or cp or chest tightness, subjective wheeze or overt sinus or hb symptoms.    Also denies any obvious fluctuation of symptoms with weather or environmental changes or other aggravating or alleviating factors except as outlined above   No unusual exposure hx or h/o childhood pna/ asthma or knowledge of premature birth.  Current Allergies, Complete Past Medical History, Past Surgical History, Family History, and Social History were reviewed in [employer]  record.  ROS  The following are not active complaints unless bolded Hoarseness, sore throat, dysphagia, dental problems, itching, sneezing,  nasal congestion with sense of excess mucus/pnds or purulent secretions, ear ache,   fever, chills, sweats, unintended wt loss or wt gain, classically pleuritic or exertional cp,  orthopnea pnd or arm/hand swelling  or leg swelling, presyncope, palpitations, abdominal pain, anorexia, nausea, vomiting, diarrhea  or change in bowel habits or change in bladder habits, change in stools or change in urine, dysuria, hematuria,  rash, arthralgias, visual complaints, headache, numbness, weakness or ataxia or problems with walking or coordination,  change in mood or  memory.                         Objective:   Physical Exam  amb wf  With harsh dry sounding upper airway pattern cough    07/04/2020        08/18/2019     196  01/27/2019        193  11/06/2018          190  08/04/2018        191  06/23/2018        188  05/15/2018        191  04/01/2018        187  02/27/2018        190  01/30/2018        193   12/20/17 199 lb 3.2 oz (90.4 kg)  04/17/17 201 lb (91.2 kg)    Vital signs reviewed  07/04/2020  - Note at rest 02 sats  98% on RA     HEENT : pt wearing mask not removed for exam due to covid -19 concerns.    NECK :  without JVD/Nodes/TM/ nl carotid upstrokes bilaterally   LUNGS: no acc muscle use,  Nl contour chest which is clear to A and P bilaterally without cough on insp or exp maneuvers   CV:  RRR  no s3 or murmur or increase in P2, and no edema   ABD:  soft and nontender with nl inspiratory excursion in the supine position. No bruits or organomegaly appreciated, bowel sounds nl  MS:  Nl gait/ ext warm without deformities, calf tenderness, cyanosis or clubbing No obvious joint restrictions   SKIN: warm and dry without lesions    NEURO:  alert, approp, nl sensorium with  no motor or cerebellar deficits apparent.      CXR PA and  Lateral:   07/04/2020 :    I personally reviewed images and agree with radiology impression as follows:   No active cardiopulmonary disease.       Assessment:

## 2020-07-04 NOTE — Patient Instructions (Addendum)
When coughing for any reason  >  Nexium Take 30- 60 min before your first and last meals of the day and add mucinex dm  1200 mg every 1200 mg every 12 hours as needed and cough into flutter valve   If still coughing change mucinex dm to phenergan dm but don't drive  When nose dripping for any reason/  throat tickle try(instead of Zyrtec)  take CHLORPHENIRAMINE  4 mg - take one every 4 hours as needed - available over the counter- may cause drowsiness so start with just a bedtime dose or two and see how you tolerate it before trying in daytime  And also can use atrovent nasal spray as needed  Prednisone 10 mg take  4 each am x 2 days,   2 each am x 2 days,  1 each am x 2 days and stop   When cough is not getting better on the above >  then start gabapentin 100 mg up to 4 x daily until cough is gone   GERD (REFLUX)  is an extremely common cause of respiratory symptoms just like yours , many times with no obvious heartburn at all.    It can be treated with medication, but also with lifestyle changes including elevation of the head of your bed (ideally with 6 -8inch blocks under the headboard of your bed),  Smoking cessation, avoidance of late meals, excessive alcohol, and avoid fatty foods, chocolate, peppermint, colas, red wine, and acidic juices such as orange juice.  NO MINT OR MENTHOL PRODUCTS SO NO COUGH DROPS  USE SUGARLESS CANDY INSTEAD (Jolley ranchers or Stover's or Life Savers) or even ice chips will also do - the key is to swallow to prevent all throat clearing. NO OIL BASED VITAMINS - use powdered substitutes.  Avoid fish oil when coughing.    Please remember to go to the  x-ray department  for your tests - we will call you with the results when they are available    Follow up as needed.

## 2020-07-05 ENCOUNTER — Encounter: Payer: Self-pay | Admitting: Internal Medicine

## 2020-07-05 NOTE — Progress Notes (Signed)
Called and left a detailed msg on machine ok per dpr with these results.

## 2020-07-05 NOTE — Assessment & Plan Note (Addendum)
Onset 2016   Susan Carpenter eval around 2014 eval Pos to trees/ pollen/ mold/ dust and shots x 2 years but  Had severe skin reactions to shots so they were stopped with no  Difference in symptoms  Singulair failed 2017-18 Spirometry 12/20/2017  wnl x for effort dep portion of f/v loop  - max gerd rx plus 1st gen H1 blockers per guidelines  12/20/2017 >>>did not use latter - CT sinus 01/01/2018  Ok  - FENO 01/30/2018  =   13  - 01/30/2018  After extensive coaching inhaler device  effectiveness =    75% try symbicort 80 2bid > cough so stopped - Allergy profile 02/27/2018 >  Eos 0.3 /  IgE 74  RAST Dust > ragweed  - 03/07/18 MCT neg  - Cylcical cough rx 04/01/2018  Saw allergy again 05/14/18 > neg skin testing x for dust >>   pnds improved with atrovent NS - HRCT 05/28/2018 >>>   Neg ILD/ mod HH  - 06/23/2018 trial of gabapentin 100 tid improved but still globus sensation so rec 100 qid > better and wanted trial off 11/06/2018 but rec max rx with 1st gen H1 blockers per guidelines  And then  try wean to  Off> restarted around 1st of march 2020 for "allergies" with pnds / did not try 1st gen H1 blockers per guidelines  Or atrovent  - 01/27/2019 retrained on purse lip breathing for "wheeze" and flutter valve in setting of "wheezing flare" - 07/04/2020 restarted gabapentin 300 mg tid as action plan for recurrence p months of no cough on just gerd rx and no rx for asthma   Of the three most common causes of  Sub-acute / recurrent or chronic cough, only one (GERD)  can actually contribute to/ trigger  the other two (asthma and post nasal drip syndrome)  and perpetuate the cylce of cough.  While not intuitively obvious, many patients with chronic low grade reflux do not cough until there is a primary insult that disturbs the protective epithelial barrier and exposes sensitive nerve endings.   This is typically viral but can due to PNDS and  either may apply here.   >>>  The point is that once this occurs, it is difficult to  eliminate the cycle  using anything but a maximally effective acid suppression regimen at least in the short run, accompanied by an appropriate diet to address non acid GERD and control / eliminate the cough itself for at least 3 days with phenergan dm and start back on gabapentin as above plus 1st gen H1 blockers per guidelines   Also added 6 days of Prednisone in case of component of Th-2 driven upper or lower airways inflammation (if cough responds short term only to relapse befor return while will on rx for uacs that would point to allergic rhinitis/ asthma or eos bronchitis)    F/u can be prn          Each maintenance medication was reviewed in detail including emphasizing most importantly the difference between maintenance and prns and under what circumstances the prns are to be triggered using an action plan format where appropriate.  Total time for H and P, chart review, counseling, teaching device and generating customized AVS unique to this acute  office visit / charting = 30 min

## 2020-07-12 ENCOUNTER — Ambulatory Visit: Payer: Medicare Other | Admitting: Rheumatology

## 2020-07-12 ENCOUNTER — Other Ambulatory Visit: Payer: Self-pay

## 2020-07-12 ENCOUNTER — Encounter: Payer: Self-pay | Admitting: Rheumatology

## 2020-07-12 VITALS — BP 132/79 | HR 78 | Resp 15 | Ht 64.0 in | Wt 179.0 lb

## 2020-07-12 DIAGNOSIS — M5030 Other cervical disc degeneration, unspecified cervical region: Secondary | ICD-10-CM | POA: Diagnosis not present

## 2020-07-12 DIAGNOSIS — G4709 Other insomnia: Secondary | ICD-10-CM | POA: Diagnosis not present

## 2020-07-12 DIAGNOSIS — M19041 Primary osteoarthritis, right hand: Secondary | ICD-10-CM

## 2020-07-12 DIAGNOSIS — M797 Fibromyalgia: Secondary | ICD-10-CM

## 2020-07-12 DIAGNOSIS — Z7189 Other specified counseling: Secondary | ICD-10-CM

## 2020-07-12 DIAGNOSIS — M19071 Primary osteoarthritis, right ankle and foot: Secondary | ICD-10-CM

## 2020-07-12 DIAGNOSIS — Z8639 Personal history of other endocrine, nutritional and metabolic disease: Secondary | ICD-10-CM

## 2020-07-12 DIAGNOSIS — R05 Cough: Secondary | ICD-10-CM

## 2020-07-12 DIAGNOSIS — R5383 Other fatigue: Secondary | ICD-10-CM

## 2020-07-12 DIAGNOSIS — M19072 Primary osteoarthritis, left ankle and foot: Secondary | ICD-10-CM

## 2020-07-12 DIAGNOSIS — Z96651 Presence of right artificial knee joint: Secondary | ICD-10-CM

## 2020-07-12 DIAGNOSIS — M5136 Other intervertebral disc degeneration, lumbar region: Secondary | ICD-10-CM | POA: Diagnosis not present

## 2020-07-12 DIAGNOSIS — M3500 Sicca syndrome, unspecified: Secondary | ICD-10-CM | POA: Diagnosis not present

## 2020-07-12 DIAGNOSIS — R053 Chronic cough: Secondary | ICD-10-CM

## 2020-07-12 DIAGNOSIS — Z8719 Personal history of other diseases of the digestive system: Secondary | ICD-10-CM

## 2020-07-12 DIAGNOSIS — Z9889 Other specified postprocedural states: Secondary | ICD-10-CM | POA: Diagnosis not present

## 2020-07-12 DIAGNOSIS — Z8659 Personal history of other mental and behavioral disorders: Secondary | ICD-10-CM

## 2020-07-12 DIAGNOSIS — M19042 Primary osteoarthritis, left hand: Secondary | ICD-10-CM

## 2020-07-12 DIAGNOSIS — N3010 Interstitial cystitis (chronic) without hematuria: Secondary | ICD-10-CM

## 2020-07-12 NOTE — Patient Instructions (Signed)

## 2020-07-14 LAB — COMPLETE METABOLIC PANEL WITH GFR
AG Ratio: 1.7 (calc) (ref 1.0–2.5)
ALT: 18 U/L (ref 6–29)
AST: 17 U/L (ref 10–35)
Albumin: 4.1 g/dL (ref 3.6–5.1)
Alkaline phosphatase (APISO): 77 U/L (ref 37–153)
BUN: 16 mg/dL (ref 7–25)
CO2: 26 mmol/L (ref 20–32)
Calcium: 9.2 mg/dL (ref 8.6–10.4)
Chloride: 102 mmol/L (ref 98–110)
Creat: 0.91 mg/dL (ref 0.60–0.93)
GFR, Est African American: 73 mL/min/{1.73_m2} (ref >=60)
GFR, Est Non African American: 63 mL/min/{1.73_m2} (ref >=60)
Globulin: 2.4 g/dL (calc) (ref 1.9–3.7)
Glucose, Bld: 90 mg/dL (ref 65–99)
Potassium: 3.9 mmol/L (ref 3.5–5.3)
Sodium: 138 mmol/L (ref 135–146)
Total Bilirubin: 0.4 mg/dL (ref 0.2–1.2)
Total Protein: 6.5 g/dL (ref 6.1–8.1)

## 2020-07-14 LAB — CBC WITH DIFFERENTIAL/PLATELET
Absolute Monocytes: 504 cells/uL (ref 200–950)
Basophils Absolute: 87 cells/uL (ref 0–200)
Basophils Relative: 0.9 %
Eosinophils Absolute: 272 cells/uL (ref 15–500)
Eosinophils Relative: 2.8 %
HCT: 41.1 % (ref 35.0–45.0)
Hemoglobin: 13.7 g/dL (ref 11.7–15.5)
Lymphs Abs: 2241 cells/uL (ref 850–3900)
MCH: 31.5 pg (ref 27.0–33.0)
MCHC: 33.3 g/dL (ref 32.0–36.0)
MCV: 94.5 fL (ref 80.0–100.0)
MPV: 10.1 fL (ref 7.5–12.5)
Monocytes Relative: 5.2 %
Neutro Abs: 6596 cells/uL (ref 1500–7800)
Neutrophils Relative %: 68 %
Platelets: 219 10*3/uL (ref 140–400)
RBC: 4.35 10*6/uL (ref 3.80–5.10)
RDW: 12.4 % (ref 11.0–15.0)
Total Lymphocyte: 23.1 %
WBC: 9.7 10*3/uL (ref 3.8–10.8)

## 2020-07-14 LAB — SJOGRENS SYNDROME-A EXTRACTABLE NUCLEAR ANTIBODY: SSA (Ro) (ENA) Antibody, IgG: <1 AI

## 2020-07-14 LAB — C3 AND C4
C3 Complement: 134 mg/dL (ref 83–193)
C4 Complement: 27 mg/dL (ref 15–57)

## 2020-07-14 LAB — ANTI-NUCLEAR AB-TITER (ANA TITER): ANA Titer 1: 1:40 {titer} — ABNORMAL HIGH

## 2020-07-14 LAB — ANA: Anti Nuclear Antibody (ANA): POSITIVE — AB

## 2020-07-14 LAB — RHEUMATOID FACTOR: Rheumatoid fact SerPl-aCnc: <14 IU/mL (ref <14)

## 2020-07-14 LAB — LACTATE DEHYDROGENASE: LDH: 151 U/L (ref 120–250)

## 2020-07-14 LAB — SJOGRENS SYNDROME-B EXTRACTABLE NUCLEAR ANTIBODY: SSB (La) (ENA) Antibody, IgG: <1 AI

## 2020-07-14 NOTE — Progress Notes (Signed)
ANA is low titer which is not significant.  Rest the labs were within normal limits.

## 2020-08-22 ENCOUNTER — Other Ambulatory Visit: Payer: Self-pay

## 2020-08-22 MED ORDER — METHOCARBAMOL 500 MG PO TABS: 500.0000 mg | ORAL_TABLET | Freq: Two times a day (BID) | ORAL | 0 refills | Status: DC | PRN

## 2020-08-22 NOTE — Telephone Encounter (Signed)
Last Visit: 07/12/2020 Next Visit: 10/13/2020  Patient requesting a prescription for Methocarbamol for her Fibromyalgia. Reviewing her chart she was advised to stop taking Methocarbamol in 09/2016 as she was switched to Baclofen.  Please advise.

## 2020-08-22 NOTE — Telephone Encounter (Signed)
Patient advised long-term use of muscle relaxers as not advised in people over 65 due to increased risk of falling.  Muscle relaxers can be given only for 5 days for a flare. Patient states she does not use it very often and only uses it for flares.

## 2020-08-22 NOTE — Telephone Encounter (Signed)
Okay okay to give methocarbamol 500 mg 1 tablet p.o. twice daily as needed total 10 tablets.

## 2020-08-22 NOTE — Telephone Encounter (Signed)
Long-term use of muscle relaxers as not advised in people over 65 due to increased risk of falling.  Muscle relaxers can be given only for 5 days for a flare.

## 2020-08-22 NOTE — Telephone Encounter (Signed)
Patient called requesting prescription refill of Methocarbamol to be sent to [facility] at [address].  Patient states the prescription has expired and the pharmacy is  requring Dr. Corliss Skains send in a new prescription.  Patient states she just needs a muscle relaxer for her fibromyalgia pain she has been experiencing.  Patient states she has also taken Flexeril in the past and is okay with either prescription being sent.

## 2020-08-23 ENCOUNTER — Ambulatory Visit: Payer: Medicare Other | Admitting: Internal Medicine

## 2020-08-24 DIAGNOSIS — Z79899 Other long term (current) drug therapy: Secondary | ICD-10-CM | POA: Diagnosis not present

## 2020-08-24 DIAGNOSIS — L661 Lichen planopilaris: Secondary | ICD-10-CM | POA: Diagnosis not present

## 2020-08-30 ENCOUNTER — Ambulatory Visit: Payer: Medicare Other | Admitting: Internal Medicine

## 2020-09-02 DIAGNOSIS — H04121 Dry eye syndrome of right lacrimal gland: Secondary | ICD-10-CM | POA: Diagnosis not present

## 2020-10-01 ENCOUNTER — Other Ambulatory Visit: Payer: Self-pay | Admitting: Rheumatology

## 2020-10-03 DIAGNOSIS — H1033 Unspecified acute conjunctivitis, bilateral: Secondary | ICD-10-CM | POA: Diagnosis not present

## 2020-10-03 NOTE — Telephone Encounter (Signed)
Last Visit: 07/12/2020 Next Visit: 10/13/2020  Okay to refill Methocarbamol?

## 2020-10-03 NOTE — Progress Notes (Signed)
Office Visit Note  Patient: Susan Carpenter             Date of Birth: [birth year]           MRN: [id number]             PCP: Leighton Ruff, MD Referring: Leighton Ruff, MD Visit Date: 10/13/2020 Occupation: _0 @  Subjective:  Muscle and joint pain.   History of Present Illness: Susan Carpenter is a 74 y.o. female with history of sicca symptoms, fibromyalgia, osteoarthritis and degenerative disc disease.  She continues to have dry mouth and dry eyes.  She has been using eyedrops and over-the-counter products.  She has been having a flare of fibromyalgia with increased pain all over.  She states she takes methocarbamol on as needed basis.  She continues to have pain and discomfort in multiple joints.  Her right knee joint which is replaced also continues to hurt.  She has discomfort in her neck and lower back.  She has been taking Plaquenil without any side effects.  Good eye examination this year.  Activities of Daily Living:  Patient reports morning stiffness for  15 minutes.   Patient Reports nocturnal pain.  Difficulty dressing/grooming: Denies Difficulty climbing stairs: Denies Difficulty getting out of chair: Denies Difficulty using hands for taps, buttons, cutlery, and/or writing: Reports  Review of Systems  Constitutional: Positive for fatigue.  HENT: Positive for mouth dryness and nose dryness. Negative for mouth sores.   Eyes: Positive for dryness. Negative for pain and itching.  Respiratory: Negative for shortness of breath and difficulty breathing.   Cardiovascular: Negative for chest pain and palpitations.  Gastrointestinal: Positive for constipation. Negative for blood in stool and diarrhea.  Endocrine: Negative for increased urination.  Genitourinary: Positive for difficulty urinating.  Musculoskeletal: Positive for arthralgias, joint pain, joint swelling, myalgias, morning stiffness, muscle tenderness and myalgias.  Skin: Positive for rash. Negative for  color change and redness.  Allergic/Immunologic: Negative for susceptible to infections.  Neurological: Positive for numbness and headaches. Negative for dizziness, memory loss and weakness.  Hematological: Positive for bruising/bleeding tendency.  Psychiatric/Behavioral: Negative for confusion.    PMFS History:  Patient Active Problem List   Diagnosis Date Noted  . Status post left rotator cuff repair 10/01/2018  . History of total knee replacement, right 10/01/2018  . DDD (degenerative disc disease), cervical 10/01/2018  . DDD (degenerative disc disease), lumbar 10/01/2018  . Cough variant asthma vs uacs 02/02/2018  . Chronic cough 12/20/2017  . Other insomnia 01/15/2017  . Other fatigue 01/15/2017  . Trapezius muscle spasm 01/15/2017  . 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 Primary osteoarthritis of both hands 09/19/2016  . OA (osteoarthritis) of knee 08/01/2015  . INTERSTITIAL CYSTITIS 01/31/2010  . Fibromyalgia 01/31/2010  . FATIGUE 01/31/2010  . DYSPNEA/SHORTNESS OF BREATH 01/31/2010  . FREQUENCY, URINARY 01/31/2010  . Depression 01/24/2009  . Migraines 09/04/2008  . HELICOBACTER PYLORI INFECTION 10/14/2007  . Allergic rhinitis 10/14/2007  . HIATAL HERNIA 10/14/2007  . ANA POSITIVE 10/14/2007  . Hypothyroidism 10/10/2007  . PAIN IN JOINT, SITE UNSPECIFIED 10/10/2007  . WEIGHT GAIN 10/10/2007    Past Medical History:  Diagnosis Date  . Allergy    rhinitis  . ANA positive   . Arthritis   . Complication of anesthesia   . Constipation   . Fibromyalgia   . Frontal fibrosing alopecia    Dr. Irish Elders   . GERD (gastroesophageal reflux disease)   . Heart murmur   . Helicobacter pylori (  H. pylori) infection    s/p treatment  . Hiatal hernia   . Interstitial cystitis   . Pain in joint    site unspecified  . PONV (postoperative nausea and vomiting)   . Psoriasis   . Psoriatic arthritis ([place])     Family History  Problem Relation Age of Onset  . Diabetes Father   .  Heart disease Father        MI  . Alcohol abuse Father   . Cancer Mother        COLON   . Mental illness Mother        ALZHEIMERS; DEPRESSION  . Thyroid disease Mother        HYPOTHYROID  . Alcohol abuse Maternal Uncle   . Diabetes Maternal Uncle   . Hypertension Maternal Grandmother   . Stroke Maternal Grandmother    Past Surgical History:  Procedure Laterality Date  . BLADDER SURGERY     x2 for scar tissue  . CHOLECYSTECTOMY  1995  . cysts on ovaries    . polyp removal  1984   from fallopian tube  . ROTATOR CUFF REPAIR Left 06/2017  . THUMB SURGERY Right 01/16/2016  . TOTAL KNEE ARTHROPLASTY Right 08/01/2015   Procedure: RIGHT TOTAL KNEE ARTHROPLASTY;  Surgeon: Gaynelle Arabian, MD;  Location: [facility];  Service: Orthopedics;  Laterality: Right;  . TUBAL LIGATION     Social History   Social History Narrative  . Not on file   Immunization History  Administered Date(s) Administered  . Influenza Whole 08/05/2004  . Influenza, High Dose Seasonal PF 08/04/2018  . Influenza-Unspecified 08/05/2013  . PFIZER SARS-COV-2 Vaccination 12/04/2019, 12/26/2019, 08/22/2020  . Td 07/06/2004     Objective: Vital Signs: BP 131/80 (BP Location: Left Arm, Patient Position: Sitting, Cuff Size: Normal)   Pulse 80   Resp 16   Ht _0  (1.626 m)   Wt 177 lb 9.6 oz (80.6 kg)   BMI 30.48 kg/m    Physical Exam Vitals and nursing note reviewed.  Constitutional:      Appearance: She is well-developed and well-nourished.  HENT:     Head: Normocephalic and atraumatic.  Eyes:     Extraocular Movements: EOM normal.     Conjunctiva/sclera: Conjunctivae normal.  Cardiovascular:     Rate and Rhythm: Normal rate and regular rhythm.     Pulses: Intact distal pulses.     Heart sounds: Normal heart sounds.  Pulmonary:     Effort: Pulmonary effort is normal.     Breath sounds: Normal breath sounds.  Abdominal:     General: Bowel sounds are normal.     Palpations: Abdomen is soft.   Musculoskeletal:     Cervical back: Normal range of motion.  Lymphadenopathy:     Cervical: No cervical adenopathy.  Skin:    General: Skin is warm and dry.     Capillary Refill: Capillary refill takes less than 2 seconds.  Neurological:     Mental Status: She is alert and oriented to person, place, and time.  Psychiatric:        Mood and Affect: Mood and affect normal.        Behavior: Behavior normal.      Musculoskeletal Exam: C-spine thoracic and lumbar spine with good range of motion.  She has some discomfort range of motion of her cervical thoracic and lumbar spine.  Shoulder joints, elbow joints, wrist joints with good range of motion.  She has severe PIP and DIP  thickening with some inflammatory component.  Hip joints and knee joints with good range of motion.  Her right knee joint is replaced.  She had good range of motion of her ankle joints.  She had osteoarthritic changes in her feet with dorsal spurring.  CDAI Exam: CDAI Score: -- Patient Global: --; Provider Global: -- Swollen: --; Tender: -- Joint Exam 10/13/2020   No joint exam has been documented for this visit   There is currently no information documented on the homunculus. Go to the Rheumatology activity and complete the homunculus joint exam.  Investigation: No additional findings.  Imaging: No results found.  Recent Labs: Lab Results  Component Value Date   WBC 9.7 07/12/2020   HGB 13.7 07/12/2020   PLT 219 07/12/2020   NA 138 07/12/2020   K 3.9 07/12/2020   CL 102 07/12/2020   CO2 26 07/12/2020   GLUCOSE 90 07/12/2020   BUN 16 07/12/2020   CREATININE 0.91 07/12/2020   BILITOT 0.4 07/12/2020   ALKPHOS 82 01/15/2017   AST 17 07/12/2020   ALT 18 07/12/2020   PROT 6.5 07/12/2020   ALBUMIN 4.0 01/15/2017   CALCIUM 9.2 07/12/2020   GFRAA 73 07/12/2020    Speciality Comments: No specialty comments available.  Procedures:  No procedures performed Allergies: Cephalexin, Oyster shell,  Penicillins, Shellfish allergy, Codeine, Doxycycline, and Erythromycin   Assessment / Plan:     Visit Diagnoses: Sjogren's syndrome with other organ involvement (HCC)-ANA 1: 40 speckled, Ro negative, La negative, RF negative.  She has seronegative disease with significant sicca symptoms.  She has been using over-the-counter products which has been helpful.  The Plaquenil has been helpful as well.  High risk medication use-she is on Plaquenil 200 mg p.o. daily.  She states she had eye examination this year.  Her labs have been stable.  We will continue to monitor labs every 5 months.  Is fully vaccinated against COVID-19.  Use of mask, social distancing and hand hygiene was discussed.  Fibromyalgia -she is on Robaxin which helps her.  Need for regular exercise and stretching was discussed.  Other insomnia-good sleep hygiene was discussed.  Other fatigue-she has fatigue due to fibromyalgia and insomnia.  Primary osteoarthritis of both hands -she has severe inflammatory osteoarthritis with DIP and PIP thickening and subluxation.  Joint protection was discussed.  HLA-B27 was negative in the past.  Status post left rotator cuff repair-she has some discomfort with range of motion.  History of total knee replacement, right-chronic pain  Primary osteoarthritis of both feet - x-rays done by the podiatrist which was consistent with osteoarthritis.  She has been having increased discomfort in her right first MTP joint and dorsal spur area.  She will see a podiatrist.  DDD (degenerative disc disease), cervical-she is some stiffness with range of motion.  DDD (degenerative disc disease), lumbar-she has chronic lower back pain.  History of hypothyroidism  History of gastroesophageal reflux (GERD)  History of depression  Interstitial cystitis  Orders: No orders of the defined types were placed in this encounter.  No orders of the defined types were placed in this encounter.    Follow-Up  Instructions: Return in about 5 months (around 03/13/2021) for Sicca, OA, DDD, FMS.   Bo Merino, MD  Note - This record has been created using Editor, commissioning.  Chart creation errors have been sought, but may not always  have been located. Such creation errors do not reflect on  the standard of medical care.

## 2020-10-13 ENCOUNTER — Other Ambulatory Visit: Payer: Self-pay

## 2020-10-13 ENCOUNTER — Encounter: Payer: Self-pay | Admitting: Rheumatology

## 2020-10-13 ENCOUNTER — Ambulatory Visit: Payer: Medicare PPO | Admitting: Rheumatology

## 2020-10-13 VITALS — BP 131/80 | HR 80 | Resp 16 | Ht 64.0 in | Wt 177.6 lb

## 2020-10-13 DIAGNOSIS — R5383 Other fatigue: Secondary | ICD-10-CM

## 2020-10-13 DIAGNOSIS — M19071 Primary osteoarthritis, right ankle and foot: Secondary | ICD-10-CM

## 2020-10-13 DIAGNOSIS — Z96651 Presence of right artificial knee joint: Secondary | ICD-10-CM | POA: Diagnosis not present

## 2020-10-13 DIAGNOSIS — N3010 Interstitial cystitis (chronic) without hematuria: Secondary | ICD-10-CM

## 2020-10-13 DIAGNOSIS — M51369 Other intervertebral disc degeneration, lumbar region without mention of lumbar back pain or lower extremity pain: Secondary | ICD-10-CM

## 2020-10-13 DIAGNOSIS — G4709 Other insomnia: Secondary | ICD-10-CM | POA: Diagnosis not present

## 2020-10-13 DIAGNOSIS — M797 Fibromyalgia: Secondary | ICD-10-CM | POA: Diagnosis not present

## 2020-10-13 DIAGNOSIS — M19072 Primary osteoarthritis, left ankle and foot: Secondary | ICD-10-CM

## 2020-10-13 DIAGNOSIS — M3509 Sicca syndrome with other organ involvement: Secondary | ICD-10-CM | POA: Diagnosis not present

## 2020-10-13 DIAGNOSIS — Z9889 Other specified postprocedural states: Secondary | ICD-10-CM

## 2020-10-13 DIAGNOSIS — Z8639 Personal history of other endocrine, nutritional and metabolic disease: Secondary | ICD-10-CM

## 2020-10-13 DIAGNOSIS — Z8719 Personal history of other diseases of the digestive system: Secondary | ICD-10-CM

## 2020-10-13 DIAGNOSIS — Z79899 Other long term (current) drug therapy: Secondary | ICD-10-CM

## 2020-10-13 DIAGNOSIS — Z8659 Personal history of other mental and behavioral disorders: Secondary | ICD-10-CM

## 2020-10-13 DIAGNOSIS — M5030 Other cervical disc degeneration, unspecified cervical region: Secondary | ICD-10-CM

## 2020-10-13 DIAGNOSIS — M19041 Primary osteoarthritis, right hand: Secondary | ICD-10-CM | POA: Diagnosis not present

## 2020-10-13 DIAGNOSIS — M5136 Other intervertebral disc degeneration, lumbar region: Secondary | ICD-10-CM

## 2020-10-13 DIAGNOSIS — M19042 Primary osteoarthritis, left hand: Secondary | ICD-10-CM

## 2020-10-13 NOTE — Patient Instructions (Signed)
Standing Labs We placed an order today for your standing lab work.   Please have your standing labs drawn in February  If possible, please have your labs drawn 2 weeks prior to your appointment so that the provider can discuss your results at your appointment.  We have open lab daily Monday through Thursday from 8:30-12:30 PM and 1:30-4:30 PM and Friday from 8:30-12:30 PM and 1:30-4:00 PM at the office of Dr. Afnan Emberton, [facility].   Please be advised, patients with office appointments requiring lab work will take precedents over walk-in lab work.  If possible, please come for your lab work on Monday and Friday afternoons, as you may experience shorter wait times. The office is located at [address], [address], [place], [place] [address] No appointment is necessary.   Labs are drawn by [facility]. Please bring your co-pay at the time of your lab draw.  You may receive a bill from [facility] for your lab work.  If you wish to have your labs drawn at another location, please call the office 24 hours in advance to send orders.  If you have any questions regarding directions or hours of operation,  please call [phone].   As a reminder, please drink plenty of water prior to coming for your lab work. Thanks!  

## 2020-10-23 ENCOUNTER — Other Ambulatory Visit: Payer: Self-pay | Admitting: Internal Medicine

## 2020-11-15 ENCOUNTER — Other Ambulatory Visit: Payer: Self-pay | Admitting: Physician Assistant

## 2020-11-15 NOTE — Telephone Encounter (Signed)
Please schedule patient for a follow up visit. Patient due May 2022. Thanks!  °

## 2020-11-15 NOTE — Telephone Encounter (Signed)
Last Visit: 10/13/2020 Next Visit: due May 2022. Message sent to the front to schedule  Current Dose per office note on 10/13/2020: not discussed.  Dx: Fibromyalgia  Okay to refill Methocarbamol?

## 2020-12-09 DIAGNOSIS — H04123 Dry eye syndrome of bilateral lacrimal glands: Secondary | ICD-10-CM | POA: Diagnosis not present

## 2020-12-09 DIAGNOSIS — H2513 Age-related nuclear cataract, bilateral: Secondary | ICD-10-CM | POA: Diagnosis not present

## 2020-12-09 DIAGNOSIS — Z79899 Other long term (current) drug therapy: Secondary | ICD-10-CM | POA: Diagnosis not present

## 2020-12-09 DIAGNOSIS — H524 Presbyopia: Secondary | ICD-10-CM | POA: Diagnosis not present

## 2021-01-11 DIAGNOSIS — Z1231 Encounter for screening mammogram for malignant neoplasm of breast: Secondary | ICD-10-CM | POA: Diagnosis not present

## 2021-01-23 DIAGNOSIS — Z0000 Encounter for general adult medical examination without abnormal findings: Secondary | ICD-10-CM | POA: Diagnosis not present

## 2021-01-23 DIAGNOSIS — Z23 Encounter for immunization: Secondary | ICD-10-CM | POA: Diagnosis not present

## 2021-01-23 DIAGNOSIS — Z1389 Encounter for screening for other disorder: Secondary | ICD-10-CM | POA: Diagnosis not present

## 2021-01-24 DIAGNOSIS — R053 Chronic cough: Secondary | ICD-10-CM | POA: Diagnosis not present

## 2021-01-24 DIAGNOSIS — D164 Benign neoplasm of bones of skull and face: Secondary | ICD-10-CM | POA: Diagnosis not present

## 2021-01-24 DIAGNOSIS — M3509 Sicca syndrome with other organ involvement: Secondary | ICD-10-CM | POA: Diagnosis not present

## 2021-01-24 DIAGNOSIS — F3341 Major depressive disorder, recurrent, in partial remission: Secondary | ICD-10-CM | POA: Diagnosis not present

## 2021-01-24 DIAGNOSIS — M797 Fibromyalgia: Secondary | ICD-10-CM | POA: Diagnosis not present

## 2021-01-24 DIAGNOSIS — L659 Nonscarring hair loss, unspecified: Secondary | ICD-10-CM | POA: Diagnosis not present

## 2021-01-24 DIAGNOSIS — K219 Gastro-esophageal reflux disease without esophagitis: Secondary | ICD-10-CM | POA: Diagnosis not present

## 2021-01-26 DIAGNOSIS — K219 Gastro-esophageal reflux disease without esophagitis: Secondary | ICD-10-CM | POA: Diagnosis not present

## 2021-01-26 DIAGNOSIS — H1045 Other chronic allergic conjunctivitis: Secondary | ICD-10-CM | POA: Diagnosis not present

## 2021-01-26 DIAGNOSIS — J301 Allergic rhinitis due to pollen: Secondary | ICD-10-CM | POA: Diagnosis not present

## 2021-01-26 DIAGNOSIS — J3089 Other allergic rhinitis: Secondary | ICD-10-CM | POA: Diagnosis not present

## 2021-02-07 DIAGNOSIS — Z1211 Encounter for screening for malignant neoplasm of colon: Secondary | ICD-10-CM | POA: Diagnosis not present

## 2021-02-22 DIAGNOSIS — L661 Lichen planopilaris: Secondary | ICD-10-CM | POA: Diagnosis not present

## 2021-02-22 DIAGNOSIS — Z79899 Other long term (current) drug therapy: Secondary | ICD-10-CM | POA: Diagnosis not present

## 2021-03-07 ENCOUNTER — Other Ambulatory Visit: Payer: Self-pay | Admitting: Physician Assistant

## 2021-03-07 NOTE — Telephone Encounter (Signed)
Next Visit: 04/11/2021  Last Visit: 10/13/2020  Last Fill: 11/15/2020  Dx: Fibromyalgia   Current Dose per office note on 10/13/2020, she is on Robaxin which helps her  Okay to refill Robaxin?

## 2021-03-28 NOTE — Progress Notes (Deleted)
Office Visit Note  Patient: Susan Carpenter             Date of Birth: [birth year]           MRN: [id number]             PCP: Juluis Rainier, MD Referring: Juluis Rainier, MD Visit Date: 04/11/2021 Occupation: @GUAROCC @  Subjective:  No chief complaint on file.   History of Present Illness: Susan Carpenter is a 75 y.o. female ***   Activities of Daily Living:  Patient reports morning stiffness for *** {minute/hour:19697}.   Patient {ACTIONS;DENIES/REPORTS:21021675::"Denies"} nocturnal pain.  Difficulty dressing/grooming: {ACTIONS;DENIES/REPORTS:21021675::"Denies"} Difficulty climbing stairs: {ACTIONS;DENIES/REPORTS:21021675::"Denies"} Difficulty getting out of chair: {ACTIONS;DENIES/REPORTS:21021675::"Denies"} Difficulty using hands for taps, buttons, cutlery, and/or writing: {ACTIONS;DENIES/REPORTS:21021675::"Denies"}  No Rheumatology ROS completed.   PMFS History:  Patient Active Problem List   Diagnosis Date Noted  . Status post left rotator cuff repair 10/01/2018  . History of total knee replacement, right 10/01/2018  . DDD (degenerative disc disease), cervical 10/01/2018  . DDD (degenerative disc disease), lumbar 10/01/2018  . Cough variant asthma vs uacs 02/02/2018  . Chronic cough 12/20/2017  . Other insomnia 01/15/2017  . Other fatigue 01/15/2017  . Trapezius muscle spasm 01/15/2017  . 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 Primary osteoarthritis of both hands 09/19/2016  . OA (osteoarthritis) of knee 08/01/2015  . INTERSTITIAL CYSTITIS 01/31/2010  . Fibromyalgia 01/31/2010  . FATIGUE 01/31/2010  . DYSPNEA/SHORTNESS OF BREATH 01/31/2010  . FREQUENCY, URINARY 01/31/2010  . Depression 01/24/2009  . Migraines 09/04/2008  . HELICOBACTER PYLORI INFECTION 10/14/2007  . Allergic rhinitis 10/14/2007  . HIATAL HERNIA 10/14/2007  . ANA POSITIVE 10/14/2007  . Hypothyroidism 10/10/2007  . PAIN IN JOINT, SITE UNSPECIFIED 10/10/2007  . WEIGHT GAIN 10/10/2007    Past  Medical History:  Diagnosis Date  . Allergy    rhinitis  . ANA positive   . Arthritis   . Complication of anesthesia   . Constipation   . Fibromyalgia   . Frontal fibrosing alopecia    Dr. 14/03/2007   . GERD (gastroesophageal reflux disease)   . Heart murmur   . Helicobacter pylori (H. pylori) infection    s/p treatment  . Hiatal hernia   . Interstitial cystitis   . Pain in joint    site unspecified  . PONV (postoperative nausea and vomiting)   . Psoriasis   . Psoriatic arthritis (HCC)     Family History  Problem Relation Age of Onset  . Diabetes Father   . Heart disease Father        MI  . Alcohol abuse Father   . Cancer Mother        COLON   . Mental illness Mother        ALZHEIMERS; DEPRESSION  . Thyroid disease Mother        HYPOTHYROID  . Alcohol abuse Maternal Uncle   . Diabetes Maternal Uncle   . Hypertension Maternal Grandmother   . Stroke Maternal Grandmother    Past Surgical History:  Procedure Laterality Date  . BLADDER SURGERY     x2 for scar tissue  . CHOLECYSTECTOMY  1995  . cysts on ovaries    . polyp removal  1984   from fallopian tube  . ROTATOR CUFF REPAIR Left 06/2017  . THUMB SURGERY Right 01/16/2016  . TOTAL KNEE ARTHROPLASTY Right 08/01/2015   Procedure: RIGHT TOTAL KNEE ARTHROPLASTY;  Surgeon: 08/03/2015, MD;  Location: [facility];  Service: Orthopedics;  Laterality: Right;  .  TUBAL LIGATION     Social History   Social History Narrative  . Not on file   Immunization History  Administered Date(s) Administered  . Influenza Whole 08/05/2004  . Influenza, High Dose Seasonal PF 08/04/2018  . Influenza-Unspecified 08/05/2013  . PFIZER(Purple Top)SARS-COV-2 Vaccination 12/04/2019, 12/26/2019, 08/22/2020  . Td 07/06/2004     Objective: Vital Signs: There were no vitals taken for this visit.   Physical Exam   Musculoskeletal Exam: ***  CDAI Exam: CDAI Score: -- Patient Global: --; Provider Global: -- Swollen: --; Tender: -- Joint  Exam 04/11/2021   No joint exam has been documented for this visit   There is currently no information documented on the homunculus. Go to the Rheumatology activity and complete the homunculus joint exam.  Investigation: No additional findings.  Imaging: No results found.  Recent Labs: Lab Results  Component Value Date   WBC 9.7 07/12/2020   HGB 13.7 07/12/2020   PLT 219 07/12/2020   NA 138 07/12/2020   K 3.9 07/12/2020   CL 102 07/12/2020   CO2 26 07/12/2020   GLUCOSE 90 07/12/2020   BUN 16 07/12/2020   CREATININE 0.91 07/12/2020   BILITOT 0.4 07/12/2020   ALKPHOS 82 01/15/2017   AST 17 07/12/2020   ALT 18 07/12/2020   PROT 6.5 07/12/2020   ALBUMIN 4.0 01/15/2017   CALCIUM 9.2 07/12/2020   GFRAA 73 07/12/2020    Speciality Comments: PLQ EYE EXAM 12/09/2020 [facility]  Procedures:  No procedures performed Allergies: Cephalexin, Oyster shell, Penicillins, Shellfish allergy, Codeine, Doxycycline, and Erythromycin   Assessment / Plan:     Visit Diagnoses: No diagnosis found.  Orders: No orders of the defined types were placed in this encounter.  No orders of the defined types were placed in this encounter.   Face-to-face time spent with patient was *** minutes. Greater than 50% of time was spent in counseling and coordination of care.  Follow-Up Instructions: No follow-ups on file.   Ellen Henri, CMA  Note - This record has been created using Animal nutritionist.  Chart creation errors have been sought, but may not always  have been located. Such creation errors do not reflect on  the standard of medical care.

## 2021-04-11 ENCOUNTER — Ambulatory Visit: Payer: Medicare PPO | Admitting: Physician Assistant

## 2021-04-11 DIAGNOSIS — M3509 Sicca syndrome with other organ involvement: Secondary | ICD-10-CM

## 2021-04-11 DIAGNOSIS — Z79899 Other long term (current) drug therapy: Secondary | ICD-10-CM

## 2021-04-11 DIAGNOSIS — M19071 Primary osteoarthritis, right ankle and foot: Secondary | ICD-10-CM

## 2021-04-11 DIAGNOSIS — Z8639 Personal history of other endocrine, nutritional and metabolic disease: Secondary | ICD-10-CM

## 2021-04-11 DIAGNOSIS — Z9889 Other specified postprocedural states: Secondary | ICD-10-CM

## 2021-04-11 DIAGNOSIS — N3010 Interstitial cystitis (chronic) without hematuria: Secondary | ICD-10-CM

## 2021-04-11 DIAGNOSIS — M797 Fibromyalgia: Secondary | ICD-10-CM

## 2021-04-11 DIAGNOSIS — G4709 Other insomnia: Secondary | ICD-10-CM

## 2021-04-11 DIAGNOSIS — Z96651 Presence of right artificial knee joint: Secondary | ICD-10-CM

## 2021-04-11 DIAGNOSIS — R5383 Other fatigue: Secondary | ICD-10-CM

## 2021-04-11 DIAGNOSIS — Z8659 Personal history of other mental and behavioral disorders: Secondary | ICD-10-CM

## 2021-04-11 DIAGNOSIS — M5136 Other intervertebral disc degeneration, lumbar region: Secondary | ICD-10-CM

## 2021-04-11 DIAGNOSIS — Z8719 Personal history of other diseases of the digestive system: Secondary | ICD-10-CM

## 2021-04-11 DIAGNOSIS — M19041 Primary osteoarthritis, right hand: Secondary | ICD-10-CM

## 2021-04-11 DIAGNOSIS — M5030 Other cervical disc degeneration, unspecified cervical region: Secondary | ICD-10-CM

## 2021-05-16 ENCOUNTER — Other Ambulatory Visit: Payer: Self-pay | Admitting: Physician Assistant

## 2021-05-17 NOTE — Telephone Encounter (Signed)
Next Visit: 07/18/2021   Last Visit: 10/13/2020   Last Fill: 11/15/2020   Dx: Fibromyalgia    Current Dose per office note on 10/13/2020, she is on Robaxin which helps her   Okay to refill Robaxin?

## 2021-06-06 DIAGNOSIS — L661 Lichen planopilaris: Secondary | ICD-10-CM | POA: Diagnosis not present

## 2021-06-06 DIAGNOSIS — Z79899 Other long term (current) drug therapy: Secondary | ICD-10-CM | POA: Diagnosis not present

## 2021-06-16 DIAGNOSIS — H04123 Dry eye syndrome of bilateral lacrimal glands: Secondary | ICD-10-CM | POA: Diagnosis not present

## 2021-06-16 DIAGNOSIS — H2513 Age-related nuclear cataract, bilateral: Secondary | ICD-10-CM | POA: Diagnosis not present

## 2021-06-29 ENCOUNTER — Other Ambulatory Visit: Payer: Self-pay | Admitting: Rheumatology

## 2021-06-29 NOTE — Telephone Encounter (Signed)
Next Visit: 07/18/2021   Last Visit: 10/13/2020   Last Fill: 11/15/2020   Dx: Fibromyalgia    Current Dose per office note on 10/13/2020, she is on Robaxin which helps her   Okay to refill Robaxin?  

## 2021-07-04 NOTE — Progress Notes (Signed)
Office Visit Note  Patient: Susan Carpenter             Date of Birth: 1946-07-15           MRN: [id number]             PCP: Leighton Ruff, MD Referring: Leighton Ruff, MD Visit Date: 07/18/2021 Occupation: _0 @  Subjective:  Left hip pain   History of Present Illness: Susan Carpenter is a 75 y.o. female with history of Sjogren's syndrome, osteoarthritis, fibromyalgia, and DDD.  Patient is taking Plaquenil 200 mg 1 tablet by mouth daily prescribed by her dermatologist.  She continues to have chronic sicca symptoms.  Her dry mouth is fairly well managed by drinking water throughout the day.  She has not been using any over-the-counter products for symptomatic relief.  She sees her dentist every 6 months and denies any recent dental caries.  She continues to have chronic eye dryness and uses vital tears 4 times a day which provides about 80% improvement in her symptoms.  She continues to follow-up with her ophthalmologist closely.  She denies any swollen lymph nodes or parotid swelling or tenderness.  She denies any shortness of breath.  She presents today with increased pain in the left hip.  She has a known history of trochanter bursitis of the left hip but states that her discomfort feels different than it previously did.  She has been having significant discomfort at night.  She is also had intermittent groin pain.  She has been taking Aleve on a daily basis for pain relief as well as methocarbamol 500 mg twice daily as needed for muscle spasms.  She continues to have chronic pain in both hands and both feet.  She previously tried taking diclofenac which provided significant pain relief but she had to discontinue due to developing GI side effects.  She experiences intermittent myalgias and muscle tenderness due to fibromyalgia.   Activities of Daily Living:  Patient reports morning stiffness for 10 minutes.   Patient Reports nocturnal pain.  Difficulty dressing/grooming:  Reports Difficulty climbing stairs: Reports Difficulty getting out of chair: Denies Difficulty using hands for taps, buttons, cutlery, and/or writing: Reports  Review of Systems  Constitutional:  Negative for fatigue.  HENT:  Positive for mouth dryness. Negative for mouth sores and nose dryness.   Eyes:  Positive for dryness. Negative for pain and itching.  Respiratory:  Negative for shortness of breath and difficulty breathing.   Cardiovascular:  Negative for chest pain and palpitations.  Gastrointestinal:  Negative for blood in stool, constipation and diarrhea.  Endocrine: Positive for increased urination.  Genitourinary:  Positive for difficulty urinating.  Musculoskeletal:  Positive for joint pain, joint pain, joint swelling, myalgias, morning stiffness, muscle tenderness and myalgias.  Skin:  Negative for color change, rash and redness.  Allergic/Immunologic: Negative for susceptible to infections.  Neurological:  Positive for numbness and headaches. Negative for dizziness, memory loss and weakness.  Hematological:  Positive for bruising/bleeding tendency.  Psychiatric/Behavioral:  Negative for confusion.    PMFS History:  Patient Active Problem List   Diagnosis Date Noted   Status post left rotator cuff repair 10/01/2018   History of total knee replacement, right 10/01/2018   DDD (degenerative disc disease), cervical 10/01/2018   DDD (degenerative disc disease), lumbar 10/01/2018   Cough variant asthma vs uacs 02/02/2018   Chronic cough 12/20/2017   Other insomnia 01/15/2017   Other fatigue 01/15/2017   Trapezius muscle spasm 01/15/2017  Alopecia, scarring 09/21/2016   Primary osteoarthritis of both hands 09/19/2016   OA (osteoarthritis) of knee 08/01/2015   INTERSTITIAL CYSTITIS 01/31/2010   Fibromyalgia 01/31/2010   FATIGUE 01/31/2010   DYSPNEA/SHORTNESS OF BREATH 01/31/2010   FREQUENCY, URINARY 01/31/2010   Depression 01/24/2009   Migraines 56/81/2751    HELICOBACTER PYLORI INFECTION 10/14/2007   Allergic rhinitis 10/14/2007   HIATAL HERNIA 10/14/2007   ANA POSITIVE 10/14/2007   Hypothyroidism 10/10/2007   PAIN IN JOINT, SITE UNSPECIFIED 10/10/2007   WEIGHT GAIN 10/10/2007    Past Medical History:  Diagnosis Date   Allergy    rhinitis   ANA positive    Arthritis    Complication of anesthesia    Constipation    Fibromyalgia    Frontal fibrosing alopecia    Dr. Irish Elders    GERD (gastroesophageal reflux disease)    Heart murmur    Helicobacter pylori (H. pylori) infection    s/p treatment   Hiatal hernia    Interstitial cystitis    Pain in joint    site unspecified   PONV (postoperative nausea and vomiting)    Psoriasis    Psoriatic arthritis ([place])     Family History  Problem Relation Age of Onset   Diabetes Father    Heart disease Father        MI   Alcohol abuse Father    Cancer Mother        COLON    Mental illness Mother        ALZHEIMERS; DEPRESSION   Thyroid disease Mother        HYPOTHYROID   Alcohol abuse Maternal Uncle    Diabetes Maternal Uncle    Hypertension Maternal Grandmother    Stroke Maternal Grandmother    Past Surgical History:  Procedure Laterality Date   BLADDER SURGERY     x2 for scar tissue   CHOLECYSTECTOMY  1995   cysts on ovaries     polyp removal  1984   from fallopian tube   ROTATOR CUFF REPAIR Left 06/2017   THUMB SURGERY Right 01/16/2016   TOTAL KNEE ARTHROPLASTY Right 08/01/2015   Procedure: RIGHT TOTAL KNEE ARTHROPLASTY;  Surgeon: Gaynelle Arabian, MD;  Location: [facility];  Service: Orthopedics;  Laterality: Right;   TUBAL LIGATION     Social History   Social History Narrative   Not on file   Immunization History  Administered Date(s) Administered   Influenza Whole 08/05/2004   Influenza, High Dose Seasonal PF 08/04/2018   Influenza-Unspecified 08/05/2013   PFIZER(Purple Top)SARS-COV-2 Vaccination 12/04/2019, 12/26/2019, 08/22/2020   Td 07/06/2004     Objective: Vital  Signs: BP 123/77 (BP Location: Left Arm, Patient Position: Sitting, Cuff Size: Normal)   Pulse 81   Ht _0  (1.626 m)   Wt 182 lb 3.2 oz (82.6 kg)   BMI 31.27 kg/m    Physical Exam Vitals and nursing note reviewed.  Constitutional:      Appearance: She is well-developed.  HENT:     Head: Normocephalic and atraumatic.  Eyes:     Conjunctiva/sclera: Conjunctivae normal.  Cardiovascular:     Rate and Rhythm: Normal rate and regular rhythm.     Heart sounds: Normal heart sounds.  Pulmonary:     Effort: Pulmonary effort is normal.     Breath sounds: Normal breath sounds.  Abdominal:     General: Bowel sounds are normal.     Palpations: Abdomen is soft.  Musculoskeletal:     Cervical back: Normal  range of motion.  Lymphadenopathy:     Cervical: No cervical adenopathy.  Skin:    General: Skin is warm and dry.     Capillary Refill: Capillary refill takes less than 2 seconds.  Neurological:     Mental Status: She is alert and oriented to person, place, and time.  Psychiatric:        Behavior: Behavior normal.     Musculoskeletal Exam: C-spine is limited range of motion with lateral rotation.  Right shoulder has good range of motion with no discomfort.  Left shoulder is slightly limited range of motion with discomfort and stiffness.  Elbow joints, wrist joints, MCPs, PIPs, DIPs have good range of motion with no synovitis.  Severe PIP and DIP thickening and prominence consistent with osteoarthritis of both hands noted.  Tenderness of several joints described below.  Right hip has good range of motion with no discomfort.  No tenderness over the right trochanteric bursa.  Left hip has good range of motion with mild discomfort.  Tenderness over the left trochanteric bursa and IT band.  Right knee replacement has good range of motion with no warmth or effusion.  Left knee joint has good range of motion with no discomfort.  Ankle joints have good range of motion.  Tenderness over the left ankle  joint noted.  PIP and DIP thickening consistent with osteoarthritis of both hands.  CDAI Exam: CDAI Score: -- Patient Global: --; Provider Global: -- Swollen: --; Tender: -- Joint Exam 07/18/2021   No joint exam has been documented for this visit   There is currently no information documented on the homunculus. Go to the Rheumatology activity and complete the homunculus joint exam.  Investigation: No additional findings.  Imaging: No results found.  Recent Labs: Lab Results  Component Value Date   WBC 9.7 07/12/2020   HGB 13.7 07/12/2020   PLT 219 07/12/2020   NA 138 07/12/2020   K 3.9 07/12/2020   CL 102 07/12/2020   CO2 26 07/12/2020   GLUCOSE 90 07/12/2020   BUN 16 07/12/2020   CREATININE 0.91 07/12/2020   BILITOT 0.4 07/12/2020   ALKPHOS 82 01/15/2017   AST 17 07/12/2020   ALT 18 07/12/2020   PROT 6.5 07/12/2020   ALBUMIN 4.0 01/15/2017   CALCIUM 9.2 07/12/2020   GFRAA 73 07/12/2020    Speciality Comments: PLQ EYE EXAM 12/09/2020 N[facility]  Procedures:  Large Joint Inj: L greater trochanter on 07/18/2021 10:36 AM Indications: pain Details: 27 G 1.5 in needle, lateral approach  Arthrogram: No  Medications: 1.5 mL lidocaine 1 %; 40 mg triamcinolone acetonide 40 MG/ML Aspirate: 0 mL Outcome: tolerated well, no immediate complications Procedure, treatment alternatives, risks and benefits explained, specific risks discussed. Consent was given by the patient. Immediately prior to procedure a time out was called to verify the correct patient, procedure, equipment, support staff and site/side marked as required. Patient was prepped and draped in the usual sterile fashion.    Allergies: Cephalexin, Oyster shell, Penicillins, Shellfish allergy, Codeine, Doxycycline, and Erythromycin   Assessment / Plan:     Visit Diagnoses: Sjogren's syndrome with other organ involvement ([place]) - ANA 1: 40 speckled, Ro negative, La negative, RF negative: She  continues to experience chronic sicca symptoms.  She has not been using any over-the-counter products for dry mouth but tries to drink water throughout the day to alleviate her symptoms.  She continues to see the dentist every 6 months and has not had any recent dental  caries.  No cervical lymphadenopathy or parotid swelling or tenderness noted.  She continues to see her ophthalmologist on a regular basis and has been using vital tears 4 times a day, which improves her symptoms by about 80%.  She has not been experiencing any shortness of breath.  She continues to have pain in multiple joints due to underlying osteoarthritis.  No synovitis was noted on examination today.  She remains on Plaquenil 200 mg 1 tablet by mouth daily prescribed by her dermatologist.  She continues to tolerate Plaquenil without any side effects.  She had a normal Plaquenil eye exam on 12/09/2020.  She has lab work on a regular basis ordered by her dermatologist.  High risk medication use - Plaquenil 200 mg 1 tablet by mouth daily.  Prescribed by dermatology.  PLQ EYE EXAM 12/09/2020.  CBC with differential and hepatic function panel ordered on 06/06/2021.  Fibromyalgia: She continues to experience intermittent myalgias and muscle tenderness due to fibromyalgia.  Her discomfort is most severe in bilateral lower extremities especially at night.  She has been taking methocarbamol 500 mg twice daily as needed for muscle spasms.  She presents today with discomfort due to trochanteric bursitis of the left hip.  Updated x-rays of the left hip were obtained today which were unremarkable.  The left trochanteric bursa was injected with cortisone after informed consent.  Referral to physical therapy will be placed today.  Trochanteric bursitis, left hip: She presents today with tenderness over the left trochanteric bursa.  She has been experiencing nocturnal pain when lying at night.  On examination she has good range of motion of the left hip joint.   X-rays of the left hip were obtained today since her pain has been more severe than it has been in the past.  X-rays were unremarkable.  The left trochanteric bursa was injected with cortisone after informed consent.  She was advised to notify us if her discomfort persists or worsens.  Referral to physical therapy will be placed today.  Pain in left hip -She presents today with increased pain in the left.  She has good range of motion of the left hip joint with some discomfort in the groin.  X-rays of the left hip were obtained today which were unremarkable.  Plan: XR HIP UNILAT W OR W/O PELVIS 2-3 VIEWS LEFT  Other fatigue: Chronic but stable.  Other insomnia: She is been experiencing nocturnal pain in the left hip which has caused interrupted sleep at night.  She has been taking Aleve at bedtime as needed for pain relief.  Primary osteoarthritis of both hands - HLA-B27 was negative in the past.  She has severe PIP and DIP thickening consistent with osteoarthritis of both hands.  Status post left rotator cuff repair: She has slightly limited range of motion with discomfort and stiffness in the left shoulder.  History of total knee replacement, right: Doing well.  She has good range of motion with no discomfort at this time.  No warmth or effusion noted.  Primary osteoarthritis of both feet - x-rays done by the podiatrist which was consistent with osteoarthritis.   DDD (degenerative disc disease), cervical: C-spine has limited range of motion with lateral rotation.  DDD (degenerative disc disease), lumbar: Chronic pain.  Other medical conditions are listed as follows:   History of gastroesophageal reflux (GERD)  History of hypothyroidism  History of depression  Interstitial cystitis    Orders: Orders Placed This Encounter  Procedures   XR HIP UNILAT W  OR W/O PELVIS 2-3 VIEWS LEFT   No orders of the defined types were placed in this encounter.    Follow-Up Instructions: Return  in about 6 months (around 01/15/2022) for Sjogren's syndrome, Fibromyalgia, Osteoarthritis.   Ofilia Neas, PA-C  Note - This record has been created using Dragon software.  Chart creation errors have been sought, but may not always  have been located. Such creation errors do not reflect on  the standard of medical care.

## 2021-07-18 ENCOUNTER — Encounter: Payer: Self-pay | Admitting: Physician Assistant

## 2021-07-18 ENCOUNTER — Ambulatory Visit: Payer: Self-pay

## 2021-07-18 ENCOUNTER — Ambulatory Visit: Payer: Medicare PPO | Admitting: Physician Assistant

## 2021-07-18 ENCOUNTER — Other Ambulatory Visit: Payer: Self-pay

## 2021-07-18 VITALS — BP 123/77 | HR 81 | Ht 64.0 in | Wt 182.2 lb

## 2021-07-18 DIAGNOSIS — R5383 Other fatigue: Secondary | ICD-10-CM

## 2021-07-18 DIAGNOSIS — Z79899 Other long term (current) drug therapy: Secondary | ICD-10-CM

## 2021-07-18 DIAGNOSIS — M3509 Sicca syndrome with other organ involvement: Secondary | ICD-10-CM

## 2021-07-18 DIAGNOSIS — M19041 Primary osteoarthritis, right hand: Secondary | ICD-10-CM

## 2021-07-18 DIAGNOSIS — Z96651 Presence of right artificial knee joint: Secondary | ICD-10-CM | POA: Diagnosis not present

## 2021-07-18 DIAGNOSIS — Z9889 Other specified postprocedural states: Secondary | ICD-10-CM | POA: Diagnosis not present

## 2021-07-18 DIAGNOSIS — M7062 Trochanteric bursitis, left hip: Secondary | ICD-10-CM

## 2021-07-18 DIAGNOSIS — M19071 Primary osteoarthritis, right ankle and foot: Secondary | ICD-10-CM

## 2021-07-18 DIAGNOSIS — Z8719 Personal history of other diseases of the digestive system: Secondary | ICD-10-CM

## 2021-07-18 DIAGNOSIS — G4709 Other insomnia: Secondary | ICD-10-CM | POA: Diagnosis not present

## 2021-07-18 DIAGNOSIS — M797 Fibromyalgia: Secondary | ICD-10-CM | POA: Diagnosis not present

## 2021-07-18 DIAGNOSIS — M19042 Primary osteoarthritis, left hand: Secondary | ICD-10-CM

## 2021-07-18 DIAGNOSIS — M19072 Primary osteoarthritis, left ankle and foot: Secondary | ICD-10-CM

## 2021-07-18 DIAGNOSIS — M25552 Pain in left hip: Secondary | ICD-10-CM | POA: Diagnosis not present

## 2021-07-18 DIAGNOSIS — Z8639 Personal history of other endocrine, nutritional and metabolic disease: Secondary | ICD-10-CM

## 2021-07-18 DIAGNOSIS — N3010 Interstitial cystitis (chronic) without hematuria: Secondary | ICD-10-CM

## 2021-07-18 DIAGNOSIS — Z8659 Personal history of other mental and behavioral disorders: Secondary | ICD-10-CM

## 2021-07-18 DIAGNOSIS — M5030 Other cervical disc degeneration, unspecified cervical region: Secondary | ICD-10-CM

## 2021-07-18 DIAGNOSIS — M5136 Other intervertebral disc degeneration, lumbar region: Secondary | ICD-10-CM

## 2021-07-18 MED ORDER — TRIAMCINOLONE ACETONIDE 40 MG/ML IJ SUSP
40.0000 mg | INTRAMUSCULAR | Status: AC | PRN
Start: 2021-07-18 — End: 2021-07-18
  Administered 2021-07-18: 40 mg via INTRA_ARTICULAR

## 2021-07-18 MED ORDER — LIDOCAINE HCL 1 % IJ SOLN
1.5000 mL | INTRAMUSCULAR | Status: AC | PRN
  Administered 2021-07-18: 1.5 mL

## 2021-07-18 NOTE — Addendum Note (Signed)
Addended by: Geroge Baseman C on: 07/18/2021 02:00 PM   Modules accepted: Orders

## 2021-08-03 ENCOUNTER — Ambulatory Visit (HOSPITAL_BASED_OUTPATIENT_CLINIC_OR_DEPARTMENT_OTHER): Payer: Medicare PPO | Attending: Physician Assistant | Admitting: Physical Therapy

## 2021-08-03 ENCOUNTER — Encounter (HOSPITAL_BASED_OUTPATIENT_CLINIC_OR_DEPARTMENT_OTHER): Payer: Self-pay | Admitting: Physical Therapy

## 2021-08-03 ENCOUNTER — Other Ambulatory Visit: Payer: Self-pay

## 2021-08-03 DIAGNOSIS — M25552 Pain in left hip: Secondary | ICD-10-CM | POA: Diagnosis not present

## 2021-08-03 NOTE — Therapy (Signed)
OUTPATIENT PHYSICAL THERAPY LOWER EXTREMITY EVALUATION   Patient Name: Susan Carpenter MRN: [id number] DOB:05-18-1946, 75 y.o., female Today's Date: 08/03/2021   PT End of Session - 08/03/21 0935     Visit Number 1    Number of Visits 13    Date for PT Re-Evaluation 09/15/21    Authorization Type [employer] MCR    PT Start Time 0930    PT Stop Time 1015    PT Time Calculation (min) 45 min    Activity Tolerance Patient tolerated treatment well    Behavior During Therapy WFL for tasks assessed/performed             Past Medical History:  Diagnosis Date   Allergy    rhinitis   ANA positive    Arthritis    Complication of anesthesia    Constipation    Fibromyalgia    Frontal fibrosing alopecia    Dr. Corky Downs    GERD (gastroesophageal reflux disease)    Heart murmur    Helicobacter pylori (H. pylori) infection    s/p treatment   Hiatal hernia    Interstitial cystitis    Pain in joint    site unspecified   PONV (postoperative nausea and vomiting)    Psoriasis    Psoriatic arthritis ([facility])    Past Surgical History:  Procedure Laterality Date   BLADDER SURGERY     x2 for scar tissue   CHOLECYSTECTOMY  1995   cysts on ovaries     polyp removal  1984   from fallopian tube   ROTATOR CUFF REPAIR Left 06/2017   THUMB SURGERY Right 01/16/2016   TOTAL KNEE ARTHROPLASTY Right 08/01/2015   Procedure: RIGHT TOTAL KNEE ARTHROPLASTY;  Surgeon: Ollen Gross, MD;  Location: [facility];  Service: Orthopedics;  Laterality: Right;   TUBAL LIGATION     Patient Active Problem List   Diagnosis Date Noted   Status post left rotator cuff repair 10/01/2018   History of total knee replacement, right 10/01/2018   DDD (degenerative disc disease), cervical 10/01/2018   DDD (degenerative disc disease), lumbar 10/01/2018   Cough variant asthma vs uacs 02/02/2018   Chronic cough 12/20/2017   Other insomnia 01/15/2017   Other fatigue 01/15/2017   Trapezius muscle spasm 01/15/2017   Alopecia,  scarring 09/21/2016   Primary osteoarthritis of both hands 09/19/2016   OA (osteoarthritis) of knee 08/01/2015   INTERSTITIAL CYSTITIS 01/31/2010   Fibromyalgia 01/31/2010   FATIGUE 01/31/2010   DYSPNEA/SHORTNESS OF BREATH 01/31/2010   FREQUENCY, URINARY 01/31/2010   Depression 01/24/2009   Migraines 09/04/2008   HELICOBACTER PYLORI INFECTION 10/14/2007   Allergic rhinitis 10/14/2007   HIATAL HERNIA 10/14/2007   ANA POSITIVE 10/14/2007   Hypothyroidism 10/10/2007   PAIN IN JOINT, SITE UNSPECIFIED 10/10/2007   WEIGHT GAIN 10/10/2007    PCP: Juluis Rainier, MD  REFERRING PROVIDER: Gearldine Bienenstock, PA-C  REFERRING DIAG: U31.49 (ICD-10-CM) - Trochanteric bursitis, left hip   THERAPY DIAG:  Pain in left hip  ONSET DATE: chronic- noted since I was teaching  SUBJECTIVE:   SUBJECTIVE STATEMENT: 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 I try to walk 10K steps every day. Lately it's my feet and ankles that hurt. I have arthritis everywhere and need a Lt shoulder replacement. When I stopped walking my hip got better but recognized I needed to get more active. Went to [employer] and ended up with shoulder and knee work. Tried  to return to [employer] multiple times but it was too much for my joints. I do not like water aerobics or water so I don't want to do it. I tried to stretch but not very disciplined. My hip really incr in pain with stretching. Living on Aleve and sometimes muscle relaxers that help some.    PERTINENT HISTORY: Sjogren's syndrome, osteoarthritis, fibromyalgia, DDD, Rt TKA  PAIN:  Are you having pain? Yes VAS scale: 3/10 Pain location: Lt hip Pain orientation: Left  PAIN TYPE: aching Pain description: intermittent and constant  Aggravating factors: sleeping, being still Relieving factors: moving and changing positions, cortisone helped about a week  PRECAUTIONS: None  WEIGHT BEARING RESTRICTIONS No  FALLS:  Has  patient fallen in last 6 months? No, no falls but I have terrible balance  LIVING ENVIRONMENT: Lives with: lives with their spouse Lives in: House/apartment Stairs: Yes;  Has following equipment at home: None  OCCUPATION: retired Runner, broadcasting/film/video since 2010  PLOF: Independent  PATIENT GOALS reach to Lt foot to don socks/shoes, decr pain, improve tolerance to walking- constantly feels discomfort, sleep without waking (wake 2-3 times/night)   OBJECTIVE:    COGNITION:  Overall cognitive status: Within functional limits for tasks assessed     SENSATION:  Light touch: Appears intact   MUSCLE LENGTH: Hamstrings: bil 50% with stretchy end feel   POSTURE:  Wide BOS in standing  Balance: unable to maintain stable SLS bilaterally for 5s  Plapation: concordant pain in glut med/min/TFL and piriformis  LE AROM/PROM:  A/PROM Right 08/03/2021 Left 08/03/2021  Hip flexion    Hip extension    Hip abduction    Hip adduction    Hip internal rotation  25  Hip external rotation  48  Knee flexion    Knee extension    Ankle dorsiflexion    Ankle plantarflexion    Ankle inversion    Ankle eversion     (Blank rows = not tested)  LE MMT:  MMT Right 08/03/2021 Left 08/03/2021  Hip flexion 4/5 4/5  Hip extension    Hip abduction  3/5  Hip adduction    Hip internal rotation    Hip external rotation    Knee flexion    Knee extension    Ankle dorsiflexion    Ankle plantarflexion    Ankle inversion    Ankle eversion     (Blank rows = not tested)    JOINT MOBILITY ASSESSMENT:  Unable to get to end feel in supine due to discomfort   GAIT: Comments: lacking straight line walking, mild trunk rotation and arm swing-limited by Lt shoulder pain     TODAY'S TREATMENT: Manual therapy: trigger point release & stm to Lt hip abd group, rolling Lt hip following Edu in self release with tennis ball   PATIENT EDUCATION:  Education details: Teacher, music of condition, POC, HEP, exercise  form/rationale Person educated: Patient Education method: Explanation and Handouts Education comprehension: verbalized understanding and needs further education   HOME EXERCISE PROGRAM: Self STM with tennis ball   Continue walking but consider more frequent with shorter duration  ASSESSMENT:  CLINICAL IMPRESSION: Patient is a 75 y.o. F who was seen today for physical therapy evaluation and treatment for Lt hip pain. Objective impairments include decreased activity tolerance, decreased balance, difficulty walking, decreased ROM, decreased strength, increased muscle spasms, impaired flexibility, improper body mechanics, and pain. These impairments are limiting patient from cleaning, community activity, and physical fitness . Personal factors including 3+ comorbidities: Sjogren's syndrome,  osteoarthritis, fibromyalgia, DDD, Rt TKA  are also affecting patient's functional outcome. Patient will benefit from skilled PT to address above impairments and improve overall function. Pt reports they are going to [place] for a baby shower this month and she is having 2 cataract surgeries so we will adapt POC PRN for these events.   REHAB POTENTIAL: Good  CLINICAL DECISION MAKING: Unstable/unpredictable  EVALUATION COMPLEXITY: High   GOALS: Goals reviewed with patient? Yes  SHORT TERM GOALS:  STG Name Target Date Goal status  1 Pt will be independent in daily stretching regimen Baseline: denies doing so at eval 08/25/2021 INITIAL                                 LONG TERM GOALS:   LTG Name Target Date Goal status  1 Gross hip strength to 5/5 Baseline: see MMT outline 09/15/2021 INITIAL  2 Able to demo SLS for 10s bilaterally Baseline:unable at eval 09/15/2021 INITIAL  3 Pt will be able to don socks/shoes without reacher Baseline:requires reacher at eval 09/15/2021 INITIAL  4 Pt will be able to walk for at least 20 min without notable discomfort Baseline: constant at eval 09/15/2021 INITIAL                   PLAN: PT FREQUENCY: 1-2x/week  PT DURATION: 6 weeks  PLANNED INTERVENTIONS: Therapeutic exercises, Therapeutic activity, Neuro Muscular re-education, Balance training, Gait training, Patient/Family education, Joint mobilization, Stair training, Dry Needling, Cryotherapy, Moist heat, and Manual therapy  PLAN FOR NEXT SESSION: cont creating stretching regimen, add core component  Johnpaul Gillentine C. Seher Schlagel PT, DPT 08/04/21 10:47 AM  Referring diagnosis? M70.62 (ICD-10-CM) - Trochanteric bursitis, left hip  Treatment diagnosis? (if different than referring diagnosis) pain Lt hip What was this (referring dx) caused by? []  Surgery []  Fall [x]  Ongoing issue []  Arthritis []  Other: ____________  Laterality: []  Rt [x]  Lt []  Both  Check all possible CPT codes:      []  97110 (Therapeutic Exercise)  []  92507 (SLP Treatment)  []  97112 (Neuro Re-ed)   []  92526 (Swallowing Treatment)   []  97116 (Gait Training)   []  (Cognitive Training, 1st 15 minutes) []  97140 (Manual Therapy)   []  97130 (Cognitive Training, each add'l 15 minutes)  []  97530 (Therapeutic Activities)  []  Other, List CPT Code ____________    []  (Self Care)       [x]  All codes above (97110 - 97535)  []  97012 (Mechanical Traction)  []  97014 (E-stim Unattended)  []  97032 (E-stim manual)  []  97033 (Ionto)  []  97035 (Ultrasound)  []  97760 (Orthotic Fit) []  97750 (Physical Performance Training) []  (Aquatic Therapy) []  97034 (Contrast Bath) []  (Paraffin) []  97597 (Wound Care 1st 20 sq cm) []  97598 (Wound Care each add'l 20 sq cm) []  97016 (Vasopneumatic Device) []   ) []  (Prosthetic Training)

## 2021-08-07 ENCOUNTER — Other Ambulatory Visit: Payer: Self-pay

## 2021-08-07 ENCOUNTER — Ambulatory Visit (HOSPITAL_BASED_OUTPATIENT_CLINIC_OR_DEPARTMENT_OTHER): Payer: Medicare PPO | Attending: Physician Assistant | Admitting: Physical Therapy

## 2021-08-07 ENCOUNTER — Encounter (HOSPITAL_BASED_OUTPATIENT_CLINIC_OR_DEPARTMENT_OTHER): Payer: Self-pay | Admitting: Physical Therapy

## 2021-08-07 DIAGNOSIS — M25552 Pain in left hip: Secondary | ICD-10-CM | POA: Diagnosis not present

## 2021-08-07 NOTE — Therapy (Signed)
OUTPATIENT PHYSICAL THERAPY TREATMENT NOTE   Patient Name: Susan Carpenter MRN: [id number] DOB:10/11/1946, 75 y.o., female Today's Date: 08/07/2021  PCP: Juluis Rainier, MD REFERRING PROVIDER: Juluis Rainier, MD   PT End of Session - 08/07/21 1302     Visit Number 2    Number of Visits 13    Date for PT Re-Evaluation 09/15/21    Authorization Type [employer] MCR    PT Start Time 1302    PT Stop Time 1345    PT Time Calculation (min) 43 min    Activity Tolerance Patient tolerated treatment well    Behavior During Therapy WFL for tasks assessed/performed             Past Medical History:  Diagnosis Date   Allergy    rhinitis   ANA positive    Arthritis    Complication of anesthesia    Constipation    Fibromyalgia    Frontal fibrosing alopecia    Dr. Corky Downs    GERD (gastroesophageal reflux disease)    Heart murmur    Helicobacter pylori (H. pylori) infection    s/p treatment   Hiatal hernia    Interstitial cystitis    Pain in joint    site unspecified   PONV (postoperative nausea and vomiting)    Psoriasis    Psoriatic arthritis (HCC)    Past Surgical History:  Procedure Laterality Date   BLADDER SURGERY     x2 for scar tissue   CHOLECYSTECTOMY  1995   cysts on ovaries     polyp removal  1984   from fallopian tube   ROTATOR CUFF REPAIR Left 06/2017   THUMB SURGERY Right 01/16/2016   TOTAL KNEE ARTHROPLASTY Right 08/01/2015   Procedure: RIGHT TOTAL KNEE ARTHROPLASTY;  Surgeon: Ollen Gross, MD;  Location: [facility];  Service: Orthopedics;  Laterality: Right;   TUBAL LIGATION     Patient Active Problem List   Diagnosis Date Noted   Status post left rotator cuff repair 10/01/2018   History of total knee replacement, right 10/01/2018   DDD (degenerative disc disease), cervical 10/01/2018   DDD (degenerative disc disease), lumbar 10/01/2018   Cough variant asthma vs uacs 02/02/2018   Chronic cough 12/20/2017   Other insomnia 01/15/2017   Other fatigue  01/15/2017   Trapezius muscle spasm 01/15/2017   Alopecia, scarring 09/21/2016   Primary osteoarthritis of both hands 09/19/2016   OA (osteoarthritis) of knee 08/01/2015   INTERSTITIAL CYSTITIS 01/31/2010   Fibromyalgia 01/31/2010   FATIGUE 01/31/2010   DYSPNEA/SHORTNESS OF BREATH 01/31/2010   FREQUENCY, URINARY 01/31/2010   Depression 01/24/2009   Migraines 09/04/2008   HELICOBACTER PYLORI INFECTION 10/14/2007   Allergic rhinitis 10/14/2007   HIATAL HERNIA 10/14/2007   ANA POSITIVE 10/14/2007   Hypothyroidism 10/10/2007   PAIN IN JOINT, SITE UNSPECIFIED 10/10/2007   WEIGHT GAIN 10/10/2007    REFERRING DIAG: REFERRING DIAG: M70.62 (ICD-10-CM) - Trochanteric bursitis, left hip    THERAPY DIAG:  Pain in left hip  PERTINENT HISTORY: PERTINENT HISTORY: Sjogren's syndrome, osteoarthritis, fibromyalgia, DDD, Rt TKA  PRECAUTIONS: none  SUBJECTIVE: the hip felt a little better. Afternoon/evening I really want the ball.   PAIN:  Are you having pain? No Not in the hip, woke with a crick in my neck.    OBJECTIVE:  MUSCLE LENGTH: Hamstrings: bil 50% with stretchy end feel     POSTURE:  Wide BOS in standing   Balance: unable to maintain stable SLS bilaterally for 5s   Plapation:  concordant pain in glut med/min/TFL and piriformis   LE AROM/PROM:   A/PROM Right 08/03/2021 Left 08/03/2021  Hip flexion      Hip extension      Hip abduction      Hip adduction      Hip internal rotation   25  Hip external rotation   48  Knee flexion      Knee extension      Ankle dorsiflexion      Ankle plantarflexion      Ankle inversion      Ankle eversion       (Blank rows = not tested)   LE MMT:   MMT Right 08/03/2021 Left 08/03/2021  Hip flexion 4/5 4/5  Hip extension      Hip abduction   3/5  Hip adduction      Hip internal rotation      Hip external rotation      Knee flexion      Knee extension      Ankle dorsiflexion      Ankle plantarflexion      Ankle inversion       Ankle eversion       (Blank rows = not tested)       JOINT MOBILITY ASSESSMENT:  Unable to get to end feel in supine due to discomfort     GAIT: Comments: lacking straight line walking, mild trunk rotation and arm swing-limited by Lt shoulder pain    TODAY'S TREATMENT: 10/3  Nustep 5 min L5 LE only  Manual- trigger point release & roller to Lt hip  Piriformis stretch supine  Clam x30 ea  Bridge with ball squeeze  Stretching: hip flexors, HS, gastrocs  Standing hip hinge with glut set, 10b  Eval:  Manual therapy: trigger point release & stm to Lt hip abd group, rolling Lt hip following Edu in self release with tennis ball   HOME EXERCISE PROGRAM: Self STM with tennis ball                     Continue walking but consider more frequent with shorter duration   ASSESSMENT:   CLINICAL IMPRESSION: Trigger points in Lt hip musculature decreased with manual therapy today and was able to obtain a seated figure 4 position following. Good hip hinge in seated but tends to round in standing and avoids bending knees due to h/o knee pain.    REHAB POTENTIAL: Good   CLINICAL DECISION MAKING: Unstable/unpredictable   EVALUATION COMPLEXITY: High     GOALS: Goals reviewed with patient? Yes   SHORT TERM GOALS:   STG Name Target Date Goal status  1 Pt will be independent in daily stretching regimen Baseline: denies doing so at eval 08/25/2021 INITIAL                                                          LONG TERM GOALS:    LTG Name Target Date Goal status  1 Gross hip strength to 5/5 Baseline: see MMT outline 09/15/2021 INITIAL  2 Able to demo SLS for 10s bilaterally Baseline:unable at eval 09/15/2021 INITIAL  3 Pt will be able to don socks/shoes without reacher Baseline:requires reacher at eval 09/15/2021 INITIAL  4 Pt will be able to walk for at least 20 min  without notable discomfort Baseline: constant at eval 09/15/2021 INITIAL                                PLAN: PT FREQUENCY: 1-2x/week   PT DURATION: 6 weeks   PLANNED INTERVENTIONS: Therapeutic exercises, Therapeutic activity, Neuro Muscular re-education, Balance training, Gait training, Patient/Family education, Joint mobilization, Stair training, Dry Needling, Cryotherapy, Moist heat, and Manual therapy   PLAN FOR NEXT SESSION: cont creating stretching regimen, add core component  Xiomar Crompton C. Stephinie Battisti PT, DPT 08/07/21 3:52 PM    [facility]  Patient name: KEWANNA KASPRZAK MRN: [id number] DOB: 1946/10/21

## 2021-08-09 ENCOUNTER — Encounter (HOSPITAL_BASED_OUTPATIENT_CLINIC_OR_DEPARTMENT_OTHER): Payer: Self-pay | Admitting: Physical Therapy

## 2021-08-09 ENCOUNTER — Other Ambulatory Visit: Payer: Self-pay

## 2021-08-09 ENCOUNTER — Ambulatory Visit (HOSPITAL_BASED_OUTPATIENT_CLINIC_OR_DEPARTMENT_OTHER): Payer: Medicare PPO | Admitting: Physical Therapy

## 2021-08-09 DIAGNOSIS — M25552 Pain in left hip: Secondary | ICD-10-CM

## 2021-08-09 NOTE — Therapy (Signed)
OUTPATIENT PHYSICAL THERAPY TREATMENT NOTE   Patient Name: Susan Carpenter MRN: [id number] DOB:[birth year], 75 y.o., female Today's Date: 08/09/2021  PCP: Juluis Rainier, MD REFERRING PROVIDER: Juluis Rainier, MD   PT End of Session - 08/09/21 1305     Visit Number 3    Number of Visits 13    Date for PT Re-Evaluation 09/15/21    Authorization Type [employer] MCR    PT Start Time 1303    PT Stop Time 1342    PT Time Calculation (min) 39 min    Activity Tolerance Patient tolerated treatment well    Behavior During Therapy WFL for tasks assessed/performed             Past Medical History:  Diagnosis Date   Allergy    rhinitis   ANA positive    Arthritis    Complication of anesthesia    Constipation    Fibromyalgia    Frontal fibrosing alopecia    Dr. Corky Downs    GERD (gastroesophageal reflux disease)    Heart murmur    Helicobacter pylori (H. pylori) infection    s/p treatment   Hiatal hernia    Interstitial cystitis    Pain in joint    site unspecified   PONV (postoperative nausea and vomiting)    Psoriasis    Psoriatic arthritis (HCC)    Past Surgical History:  Procedure Laterality Date   BLADDER SURGERY     x2 for scar tissue   CHOLECYSTECTOMY  1995   cysts on ovaries     polyp removal  1984   from fallopian tube   ROTATOR CUFF REPAIR Left 06/2017   THUMB SURGERY Right 01/16/2016   TOTAL KNEE ARTHROPLASTY Right 08/01/2015   Procedure: RIGHT TOTAL KNEE ARTHROPLASTY;  Surgeon: Ollen Gross, MD;  Location: [facility];  Service: Orthopedics;  Laterality: Right;   TUBAL LIGATION     Patient Active Problem List   Diagnosis Date Noted   Status post left rotator cuff repair 10/01/2018   History of total knee replacement, right 10/01/2018   DDD (degenerative disc disease), cervical 10/01/2018   DDD (degenerative disc disease), lumbar 10/01/2018   Cough variant asthma vs uacs 02/02/2018   Chronic cough 12/20/2017   Other insomnia 01/15/2017   Other fatigue  01/15/2017   Trapezius muscle spasm 01/15/2017   Alopecia, scarring 09/21/2016   Primary osteoarthritis of both hands 09/19/2016   OA (osteoarthritis) of knee 08/01/2015   INTERSTITIAL CYSTITIS 01/31/2010   Fibromyalgia 01/31/2010   FATIGUE 01/31/2010   DYSPNEA/SHORTNESS OF BREATH 01/31/2010   FREQUENCY, URINARY 01/31/2010   Depression 01/24/2009   Migraines 09/04/2008   HELICOBACTER PYLORI INFECTION 10/14/2007   Allergic rhinitis 10/14/2007   HIATAL HERNIA 10/14/2007   ANA POSITIVE 10/14/2007   Hypothyroidism 10/10/2007   PAIN IN JOINT, SITE UNSPECIFIED 10/10/2007   WEIGHT GAIN 10/10/2007    REFERRING DIAG: REFERRING DIAG: M70.62 (ICD-10-CM) - Trochanteric bursitis, left hip    THERAPY DIAG:  Pain in left hip  PERTINENT HISTORY: PERTINENT HISTORY: Sjogren's syndrome, osteoarthritis, fibromyalgia, DDD, Rt TKA  PRECAUTIONS: none  SUBJECTIVE: I felt good after last time. No more crick in my neck. Not reaching for the tennis ball quite so often.   PAIN:  Are you having pain? No    OBJECTIVE:  MUSCLE LENGTH: Hamstrings: bil 50% with stretchy end feel     POSTURE:  Wide BOS in standing   Balance: unable to maintain stable SLS bilaterally for 5s   Plapation: concordant pain  in glut med/min/TFL and piriformis   LE AROM/PROM:   A/PROM Right 08/03/2021 Left 08/03/2021  Hip flexion      Hip extension      Hip abduction      Hip adduction      Hip internal rotation   25  Hip external rotation   48  Knee flexion      Knee extension      Ankle dorsiflexion      Ankle plantarflexion      Ankle inversion      Ankle eversion       (Blank rows = not tested)   LE MMT:   MMT Right 08/03/2021 Left 08/03/2021  Hip flexion 4/5 4/5  Hip extension      Hip abduction   3/5  Hip adduction      Hip internal rotation      Hip external rotation      Knee flexion      Knee extension      Ankle dorsiflexion      Ankle plantarflexion      Ankle inversion      Ankle  eversion       (Blank rows = not tested)       JOINT MOBILITY ASSESSMENT:  Unable to get to end feel in supine due to discomfort     GAIT: Comments: lacking straight line walking, mild trunk rotation and arm swing-limited by Lt shoulder pain    TODAY'S TREATMENT: 10/5   Nu step 5 min L4 Le only   Stretches: Supine HSS midline & lateral bias, Piriformis stretch   Manual: trigger point release Lt hip musculature   Lateral stepping in mini squat   Sit to stand ball bw knees   SLS with bar for support   Slow marching on airex  10/3  Nustep 5 min L5 LE only  Manual- trigger point release & roller to Lt hip  Piriformis stretch supine  Clam x30 ea  Bridge with ball squeeze  Stretching: hip flexors, HS, gastrocs  Standing hip hinge with glut set, 10b  Eval:  Manual therapy: trigger point release & stm to Lt hip abd group, rolling Lt hip following Edu in self release with tennis ball   HOME EXERCISE PROGRAM: [id number] A Self STM with tennis ball                     Continue walking but consider more frequent with shorter duration   ASSESSMENT:   CLINICAL IMPRESSION: Continued with trigger point release in hip musculature. Worked in standing on balance and hip activation to decrease hip drop. Tends to place her feet down in NBOS causing LOB so advised & practiced being mindful of foot placement.    REHAB POTENTIAL: Good   CLINICAL DECISION MAKING: Unstable/unpredictable   EVALUATION COMPLEXITY: High     GOALS: Goals reviewed with patient? Yes   SHORT TERM GOALS:   STG Name Target Date Goal status  1 Pt will be independent in daily stretching regimen Baseline: denies doing so at eval 08/25/2021 INITIAL                                                          LONG TERM GOALS:    LTG Name Target Date Goal status  1  Gross hip strength to 5/5 Baseline: see MMT outline 09/15/2021 INITIAL  2 Able to demo SLS for 10s bilaterally Baseline:unable at eval 09/15/2021  INITIAL  3 Pt will be able to don socks/shoes without reacher Baseline:requires reacher at eval 09/15/2021 INITIAL  4 Pt will be able to walk for at least 20 min without notable discomfort Baseline: constant at eval 09/15/2021 INITIAL                               PLAN: PT FREQUENCY: 1-2x/week   PT DURATION: 6 weeks   PLANNED INTERVENTIONS: Therapeutic exercises, Therapeutic activity, Neuro Muscular re-education, Balance training, Gait training, Patient/Family education, Joint mobilization, Stair training, Dry Needling, Cryotherapy, Moist heat, and Manual therapy   PLAN FOR NEXT SESSION: cont to progress core component  Demarkus Remmel C. Arlone Lenhardt PT, DPT 08/10/21 1:24 PM    [facility]  Patient name: Susan Carpenter MRN: [id number] DOB: [birth year]

## 2021-08-10 ENCOUNTER — Encounter (HOSPITAL_BASED_OUTPATIENT_CLINIC_OR_DEPARTMENT_OTHER): Payer: Self-pay | Admitting: Physical Therapy

## 2021-08-15 ENCOUNTER — Ambulatory Visit (HOSPITAL_BASED_OUTPATIENT_CLINIC_OR_DEPARTMENT_OTHER): Payer: Medicare PPO | Admitting: Physical Therapy

## 2021-08-15 ENCOUNTER — Encounter (HOSPITAL_BASED_OUTPATIENT_CLINIC_OR_DEPARTMENT_OTHER): Payer: Self-pay | Admitting: Physical Therapy

## 2021-08-15 ENCOUNTER — Other Ambulatory Visit: Payer: Self-pay

## 2021-08-15 DIAGNOSIS — M25552 Pain in left hip: Secondary | ICD-10-CM

## 2021-08-15 NOTE — Therapy (Signed)
OUTPATIENT PHYSICAL THERAPY TREATMENT NOTE   Patient Name: Susan Carpenter MRN: [id number] DOB:[birth year], 75 y.o., female Today's Date: 08/15/2021  PCP: Juluis Rainier, MD REFERRING PROVIDER: Juluis Rainier, MD   PT End of Session - 08/15/21 0845     Visit Number 4    Number of Visits 13    Date for PT Re-Evaluation 09/15/21    Authorization Type [employer] MCR    PT Start Time 0844    PT Stop Time 0925    PT Time Calculation (min) 41 min    Activity Tolerance Patient tolerated treatment well    Behavior During Therapy WFL for tasks assessed/performed             Past Medical History:  Diagnosis Date   Allergy    rhinitis   ANA positive    Arthritis    Complication of anesthesia    Constipation    Fibromyalgia    Frontal fibrosing alopecia    Dr. Corky Downs    GERD (gastroesophageal reflux disease)    Heart murmur    Helicobacter pylori (H. pylori) infection    s/p treatment   Hiatal hernia    Interstitial cystitis    Pain in joint    site unspecified   PONV (postoperative nausea and vomiting)    Psoriasis    Psoriatic arthritis (HCC)    Past Surgical History:  Procedure Laterality Date   BLADDER SURGERY     x2 for scar tissue   CHOLECYSTECTOMY  1995   cysts on ovaries     polyp removal  1984   from fallopian tube   ROTATOR CUFF REPAIR Left 06/2017   THUMB SURGERY Right 01/16/2016   TOTAL KNEE ARTHROPLASTY Right 08/01/2015   Procedure: RIGHT TOTAL KNEE ARTHROPLASTY;  Surgeon: Ollen Gross, MD;  Location: [facility];  Service: Orthopedics;  Laterality: Right;   TUBAL LIGATION     Patient Active Problem List   Diagnosis Date Noted   Status post left rotator cuff repair 10/01/2018   History of total knee replacement, right 10/01/2018   DDD (degenerative disc disease), cervical 10/01/2018   DDD (degenerative disc disease), lumbar 10/01/2018   Cough variant asthma vs uacs 02/02/2018   Chronic cough 12/20/2017   Other insomnia 01/15/2017   Other fatigue  01/15/2017   Trapezius muscle spasm 01/15/2017   Alopecia, scarring 09/21/2016   Primary osteoarthritis of both hands 09/19/2016   OA (osteoarthritis) of knee 08/01/2015   INTERSTITIAL CYSTITIS 01/31/2010   Fibromyalgia 01/31/2010   FATIGUE 01/31/2010   DYSPNEA/SHORTNESS OF BREATH 01/31/2010   FREQUENCY, URINARY 01/31/2010   Depression 01/24/2009   Migraines 09/04/2008   HELICOBACTER PYLORI INFECTION 10/14/2007   Allergic rhinitis 10/14/2007   HIATAL HERNIA 10/14/2007   ANA POSITIVE 10/14/2007   Hypothyroidism 10/10/2007   PAIN IN JOINT, SITE UNSPECIFIED 10/10/2007   WEIGHT GAIN 10/10/2007    REFERRING DIAG: REFERRING DIAG: M70.62 (ICD-10-CM) - Trochanteric bursitis, left hip    THERAPY DIAG:  Pain in left hip  PERTINENT HISTORY: PERTINENT HISTORY: Sjogren's syndrome, osteoarthritis, fibromyalgia, DDD, Rt TKA  PRECAUTIONS: none  SUBJECTIVE: my hip is hurting a good bit but I have been doing a lot of work in the yard.   PAIN:  Are you having pain? Yes Low grade all the time in hip Lt knee arthritis is acting up    OBJECTIVE:  MUSCLE LENGTH: Hamstrings: bil 50% with stretchy end feel     POSTURE:  Wide BOS in standing   Balance: unable to  maintain stable SLS bilaterally for 5s   Plapation: concordant pain in glut med/min/TFL and piriformis   LE AROM/PROM:   A/PROM Right 08/03/2021 Left 08/03/2021  Hip flexion      Hip extension      Hip abduction      Hip adduction      Hip internal rotation   25  Hip external rotation   48  Knee flexion      Knee extension      Ankle dorsiflexion      Ankle plantarflexion      Ankle inversion      Ankle eversion       (Blank rows = not tested)   LE MMT:   MMT Right 08/03/2021 Left 08/03/2021 10/11  Hip flexion 4/5 4/5 4+/5  Hip extension       Hip abduction   3/5 4/5  Hip adduction       Hip internal rotation       Hip external rotation       Knee flexion       Knee extension       Ankle dorsiflexion        Ankle plantarflexion       Ankle inversion       Ankle eversion        (Blank rows = not tested)       JOINT MOBILITY ASSESSMENT:  Unable to get to end feel in supine due to discomfort     GAIT: Comments: lacking straight line walking, mild trunk rotation and arm swing-limited by Lt shoulder pain    TODAY'S TREATMENT: 10/11   Nu step 5 min L5 LE only   Manual- hs, ITB stretching, PROM hip flexion/IR/ER, post hip mobs at end range flexion/add   Shuttle- 2*25+12 double leg neutral & ER press, 25+12 single leg press in neutral   Sidelying hip abduction, clams   Theract- stairs  10/5   Nu step 5 min L4 Le only   Stretches: Supine HSS midline & lateral bias, Piriformis stretch   Manual: trigger point release Lt hip musculature   Lateral stepping in mini squat   Sit to stand ball bw knees   SLS with bar for support   Slow marching on airex  10/3  Nustep 5 min L5 LE only  Manual- trigger point release & roller to Lt hip  Piriformis stretch supine  Clam x30 ea  Bridge with ball squeeze  Stretching: hip flexors, HS, gastrocs  Standing hip hinge with glut set, 10b  Eval:  Manual therapy: trigger point release & stm to Lt hip abd group, rolling Lt hip following Edu in self release with tennis ball   HOME EXERCISE PROGRAM: [id number] A Self STM with tennis ball                     Continue walking but consider more frequent with shorter duration   ASSESSMENT:   CLINICAL IMPRESSION: Recognize that Lt knee is arthritic and is not comfortable with changing pattern for stairs and other functional activities that place stress. Strength is improving in hip but lacks necessary level to provide support. Will cont to focus on normalizing patterns in lumbopelvic region.  REHAB POTENTIAL: Good   CLINICAL DECISION MAKING: Unstable/unpredictable   EVALUATION COMPLEXITY: High     GOALS: Goals reviewed with patient? Yes   SHORT TERM GOALS:   STG Name Target Date Goal status  1  Pt will be independent in daily  stretching regimen Baseline: denies doing so at eval 08/25/2021 INITIAL                                                          LONG TERM GOALS:    LTG Name Target Date Goal status  1 Gross hip strength to 5/5 Baseline: see MMT outline 09/15/2021 INITIAL  2 Able to demo SLS for 10s bilaterally Baseline:unable at eval 09/15/2021 INITIAL  3 Pt will be able to don socks/shoes without reacher Baseline:requires reacher at eval 09/15/2021 INITIAL  4 Pt will be able to walk for at least 20 min without notable discomfort Baseline: constant at eval 09/15/2021 INITIAL                               PLAN: PT FREQUENCY: 1-2x/week   PT DURATION: 6 weeks   PLANNED INTERVENTIONS: Therapeutic exercises, Therapeutic activity, Neuro Muscular re-education, Balance training, Gait training, Patient/Family education, Joint mobilization, Stair training, Dry Needling, Cryotherapy, Moist heat, and Manual therapy   PLAN FOR NEXT SESSION: how did stairs go at home?  Verdene Creson C. Chantele Corado PT, DPT 08/15/21 9:26 AM    [facility]  Patient name: Susan Carpenter MRN: [id number] DOB: 1945/11/14

## 2021-08-16 DIAGNOSIS — L661 Lichen planopilaris: Secondary | ICD-10-CM | POA: Diagnosis not present

## 2021-08-17 ENCOUNTER — Ambulatory Visit (HOSPITAL_BASED_OUTPATIENT_CLINIC_OR_DEPARTMENT_OTHER): Payer: Medicare PPO | Admitting: Physical Therapy

## 2021-08-17 ENCOUNTER — Other Ambulatory Visit: Payer: Self-pay

## 2021-08-17 ENCOUNTER — Encounter (HOSPITAL_BASED_OUTPATIENT_CLINIC_OR_DEPARTMENT_OTHER): Payer: Self-pay | Admitting: Physical Therapy

## 2021-08-17 DIAGNOSIS — M25552 Pain in left hip: Secondary | ICD-10-CM

## 2021-08-17 NOTE — Therapy (Signed)
OUTPATIENT PHYSICAL THERAPY TREATMENT NOTE   Patient Name: Susan Carpenter MRN: [id number] DOB:1946/08/15, 75 y.o., female Today's Date: 08/17/2021  PCP: Juluis Rainier, MD REFERRING PROVIDER: Juluis Rainier, MD   PT End of Session - 08/17/21 0930     Visit Number 5    Number of Visits 13    Date for PT Re-Evaluation 09/15/21    Authorization Type [employer] MCR    PT Start Time 0930    PT Stop Time 1011    PT Time Calculation (min) 41 min    Activity Tolerance Patient tolerated treatment well    Behavior During Therapy WFL for tasks assessed/performed             Past Medical History:  Diagnosis Date   Allergy    rhinitis   ANA positive    Arthritis    Complication of anesthesia    Constipation    Fibromyalgia    Frontal fibrosing alopecia    Dr. Corky Downs    GERD (gastroesophageal reflux disease)    Heart murmur    Helicobacter pylori (H. pylori) infection    s/p treatment   Hiatal hernia    Interstitial cystitis    Pain in joint    site unspecified   PONV (postoperative nausea and vomiting)    Psoriasis    Psoriatic arthritis (HCC)    Past Surgical History:  Procedure Laterality Date   BLADDER SURGERY     x2 for scar tissue   CHOLECYSTECTOMY  1995   cysts on ovaries     polyp removal  1984   from fallopian tube   ROTATOR CUFF REPAIR Left 06/2017   THUMB SURGERY Right 01/16/2016   TOTAL KNEE ARTHROPLASTY Right 08/01/2015   Procedure: RIGHT TOTAL KNEE ARTHROPLASTY;  Surgeon: Ollen Gross, MD;  Location: [facility];  Service: Orthopedics;  Laterality: Right;   TUBAL LIGATION     Patient Active Problem List   Diagnosis Date Noted   Status post left rotator cuff repair 10/01/2018   History of total knee replacement, right 10/01/2018   DDD (degenerative disc disease), cervical 10/01/2018   DDD (degenerative disc disease), lumbar 10/01/2018   Cough variant asthma vs uacs 02/02/2018   Chronic cough 12/20/2017   Other insomnia 01/15/2017   Other fatigue  01/15/2017   Trapezius muscle spasm 01/15/2017   Alopecia, scarring 09/21/2016   Primary osteoarthritis of both hands 09/19/2016   OA (osteoarthritis) of knee 08/01/2015   INTERSTITIAL CYSTITIS 01/31/2010   Fibromyalgia 01/31/2010   FATIGUE 01/31/2010   DYSPNEA/SHORTNESS OF BREATH 01/31/2010   FREQUENCY, URINARY 01/31/2010   Depression 01/24/2009   Migraines 09/04/2008   HELICOBACTER PYLORI INFECTION 10/14/2007   Allergic rhinitis 10/14/2007   HIATAL HERNIA 10/14/2007   ANA POSITIVE 10/14/2007   Hypothyroidism 10/10/2007   PAIN IN JOINT, SITE UNSPECIFIED 10/10/2007   WEIGHT GAIN 10/10/2007    REFERRING DIAG: REFERRING DIAG: M70.62 (ICD-10-CM) - Trochanteric bursitis, left hip    THERAPY DIAG:  Pain in left hip  PERTINENT HISTORY: PERTINENT HISTORY: Sjogren's syndrome, osteoarthritis, fibromyalgia, DDD, Rt TKA  PRECAUTIONS: none  SUBJECTIVE: I am beat and my hip is killing me. Walked about 6 miles just in the house yesterday getting ready for baby shower. I had gotten up at 5 and was up until 1AM last night.   PAIN:  Are you having pain? Yes 8/10 Lt post hip    OBJECTIVE:  MUSCLE LENGTH: Hamstrings: Lt incr to 65 deg flexibility     POSTURE:  Wide  BOS in standing   Balance: unable to maintain stable SLS bilaterally for 5s   Plapation: concordant pain in glut med/min/TFL and piriformis   LE AROM/PROM:   A/PROM Right 08/03/2021 Left 08/03/2021  Hip flexion      Hip extension      Hip abduction      Hip adduction      Hip internal rotation   25  Hip external rotation   48  Knee flexion      Knee extension      Ankle dorsiflexion      Ankle plantarflexion      Ankle inversion      Ankle eversion       (Blank rows = not tested)   LE MMT:   MMT Right 08/03/2021 Left 08/03/2021 10/11  Hip flexion 4/5 4/5 4+/5  Hip extension       Hip abduction   3/5 4/5  Hip adduction       Hip internal rotation       Hip external rotation       Knee flexion        Knee extension       Ankle dorsiflexion       Ankle plantarflexion       Ankle inversion       Ankle eversion        (Blank rows = not tested)       JOINT MOBILITY ASSESSMENT:  Unable to get to end feel in supine due to discomfort     GAIT: Comments: lacking straight line walking, mild trunk rotation and arm swing-limited by Lt shoulder pain    TODAY'S TREATMENT: 10/13   TPDN- Lt hip- piriformis, glut med/min, quadratus femoris; skilled palpation and monitoring applied   Piriformis stretch   Nu step L5 5 min   Walking around lower loop to encourage relaxed gait posture   Lt clams   Lt hip abd   LTR   Bridge pillow bw knees  10/11   Nu step 5 min L5 LE only   Manual- hs, ITB stretching, PROM hip flexion/IR/ER, post hip mobs at end range flexion/add   Shuttle- 2*25+12 double leg neutral & ER press, 25+12 single leg press in neutral   Sidelying hip abduction, clams   Theract- stairs  10/5   Nu step 5 min L4 Le only   Stretches: Supine HSS midline & lateral bias, Piriformis stretch   Manual: trigger point release Lt hip musculature   Lateral stepping in mini squat   Sit to stand ball bw knees   SLS with bar for support   Slow marching on airex  10/3  Nustep 5 min L5 LE only  Manual- trigger point release & roller to Lt hip  Piriformis stretch supine  Clam x30 ea  Bridge with ball squeeze  Stretching: hip flexors, HS, gastrocs  Standing hip hinge with glut set, 10b  Eval:  Manual therapy: trigger point release & stm to Lt hip abd group, rolling Lt hip following Edu in self release with tennis ball   HOME EXERCISE PROGRAM: [id number] A Self STM with tennis ball                     Continue walking but consider more frequent with shorter duration  PATIENT EDUCATION:  Education details: TPDN & expected outcomes, what to look for in shoes: wide base with soft mid foot Person educated: Patient Education method: Explanation and Handouts Education comprehension:  verbalized understanding and needs further education   ASSESSMENT:   CLINICAL IMPRESSION: Successful in reducing tension in Lt hip with DN today with soreness following as expected. Encouraged her to continue moving throughout her day. Patch of skin on post Lt hip is scaling and pt reports soreness deep to the skin surface as well- it is over fibers of glut max that are tight.   REHAB POTENTIAL: Good   CLINICAL DECISION MAKING: Unstable/unpredictable   EVALUATION COMPLEXITY: High     GOALS: Goals reviewed with patient? Yes   SHORT TERM GOALS:   STG Name Target Date Goal status  1 Pt will be independent in daily stretching regimen Baseline: denies doing so at eval 08/25/2021 INITIAL                                                          LONG TERM GOALS:    LTG Name Target Date Goal status  1 Gross hip strength to 5/5 Baseline: see MMT outline 09/15/2021 INITIAL  2 Able to demo SLS for 10s bilaterally Baseline:unable at eval 09/15/2021 INITIAL  3 Pt will be able to don socks/shoes without reacher Baseline:requires reacher at eval 09/15/2021 INITIAL  4 Pt will be able to walk for at least 20 min without notable discomfort Baseline: constant at eval 09/15/2021 INITIAL                               PLAN: PT FREQUENCY: 1-2x/week   PT DURATION: 6 weeks   PLANNED INTERVENTIONS: Therapeutic exercises, Therapeutic activity, Neuro Muscular re-education, Balance training, Gait training, Patient/Family education, Joint mobilization, Stair training, Dry Needling, Cryotherapy, Moist heat, and Manual therapy   PLAN FOR NEXT SESSION: outcome of DN? How was the baby shower Darianne Muralles C. Tiron Suski PT, DPT 08/17/21 10:14 AM     [facility]  Patient name: CALVIN CHURA MRN: [id number] DOB: [birth year]

## 2021-08-22 ENCOUNTER — Encounter (HOSPITAL_BASED_OUTPATIENT_CLINIC_OR_DEPARTMENT_OTHER): Payer: Self-pay | Admitting: Physical Therapy

## 2021-08-22 ENCOUNTER — Ambulatory Visit (HOSPITAL_BASED_OUTPATIENT_CLINIC_OR_DEPARTMENT_OTHER): Payer: Medicare PPO | Admitting: Physical Therapy

## 2021-08-22 ENCOUNTER — Other Ambulatory Visit: Payer: Self-pay

## 2021-08-22 DIAGNOSIS — M25552 Pain in left hip: Secondary | ICD-10-CM | POA: Diagnosis not present

## 2021-08-22 NOTE — Therapy (Signed)
OUTPATIENT PHYSICAL THERAPY TREATMENT NOTE   Patient Name: MELA PERHAM MRN: [id number] DOB:1946/03/24, 75 y.o., female Today's Date: 08/22/2021  PCP: Juluis Rainier, MD REFERRING PROVIDER: Juluis Rainier, MD   PT End of Session - 08/22/21 1148     Visit Number 6    Number of Visits 13    Date for PT Re-Evaluation 09/15/21    Authorization Type [employer] MCR    PT Start Time 1145    PT Stop Time 1223    PT Time Calculation (min) 38 min    Activity Tolerance Patient tolerated treatment well    Behavior During Therapy WFL for tasks assessed/performed             Past Medical History:  Diagnosis Date   Allergy    rhinitis   ANA positive    Arthritis    Complication of anesthesia    Constipation    Fibromyalgia    Frontal fibrosing alopecia    Dr. Corky Downs    GERD (gastroesophageal reflux disease)    Heart murmur    Helicobacter pylori (H. pylori) infection    s/p treatment   Hiatal hernia    Interstitial cystitis    Pain in joint    site unspecified   PONV (postoperative nausea and vomiting)    Psoriasis    Psoriatic arthritis (HCC)    Past Surgical History:  Procedure Laterality Date   BLADDER SURGERY     x2 for scar tissue   CHOLECYSTECTOMY  1995   cysts on ovaries     polyp removal  1984   from fallopian tube   ROTATOR CUFF REPAIR Left 06/2017   THUMB SURGERY Right 01/16/2016   TOTAL KNEE ARTHROPLASTY Right 08/01/2015   Procedure: RIGHT TOTAL KNEE ARTHROPLASTY;  Surgeon: Ollen Gross, MD;  Location: [facility];  Service: Orthopedics;  Laterality: Right;   TUBAL LIGATION     Patient Active Problem List   Diagnosis Date Noted   Status post left rotator cuff repair 10/01/2018   History of total knee replacement, right 10/01/2018   DDD (degenerative disc disease), cervical 10/01/2018   DDD (degenerative disc disease), lumbar 10/01/2018   Cough variant asthma vs uacs 02/02/2018   Chronic cough 12/20/2017   Other insomnia 01/15/2017   Other fatigue  01/15/2017   Trapezius muscle spasm 01/15/2017   Alopecia, scarring 09/21/2016   Primary osteoarthritis of both hands 09/19/2016   OA (osteoarthritis) of knee 08/01/2015   INTERSTITIAL CYSTITIS 01/31/2010   Fibromyalgia 01/31/2010   FATIGUE 01/31/2010   DYSPNEA/SHORTNESS OF BREATH 01/31/2010   FREQUENCY, URINARY 01/31/2010   Depression 01/24/2009   Migraines 09/04/2008   HELICOBACTER PYLORI INFECTION 10/14/2007   Allergic rhinitis 10/14/2007   HIATAL HERNIA 10/14/2007   ANA POSITIVE 10/14/2007   Hypothyroidism 10/10/2007   PAIN IN JOINT, SITE UNSPECIFIED 10/10/2007   WEIGHT GAIN 10/10/2007    REFERRING DIAG: REFERRING DIAG: M70.62 (ICD-10-CM) - Trochanteric bursitis, left hip    THERAPY DIAG:  Pain in left hip  PERTINENT HISTORY: PERTINENT HISTORY: Sjogren's syndrome, osteoarthritis, fibromyalgia, DDD, Rt TKA  PRECAUTIONS: none  SUBJECTIVE: I am not even having pain, the needling really helped.   PAIN:  Are you having pain? no 0/10 Lt post hip    OBJECTIVE:  MUSCLE LENGTH: Hamstrings: Lt incr to 65 deg flexibility     POSTURE:  Wide BOS in standing   Balance: unable to maintain stable SLS bilaterally for 5s   Plapation: concordant pain in glut med/min/TFL and piriformis  LE AROM/PROM:   A/PROM Right 08/03/2021 Left 08/03/2021  Hip flexion      Hip extension      Hip abduction      Hip adduction      Hip internal rotation   25  Hip external rotation   48  Knee flexion      Knee extension      Ankle dorsiflexion      Ankle plantarflexion      Ankle inversion      Ankle eversion       (Blank rows = not tested)   LE MMT:   MMT Right 08/03/2021 Left 08/03/2021 10/11 10/18  Hip flexion 4/5 4/5 4+/5 Cramping in quads  Hip extension        Hip abduction   3/5 4/5 4+5  Hip adduction        Hip internal rotation        Hip external rotation        Knee flexion        Knee extension        Ankle dorsiflexion        Ankle plantarflexion         Ankle inversion        Ankle eversion         (Blank rows = not tested)       JOINT MOBILITY ASSESSMENT:  Unable to get to end feel in supine due to discomfort     GAIT: Comments: lacking straight line walking, mild trunk rotation and arm swing-limited by Lt shoulder pain    TODAY'S TREATMENT: 10/18    Nu step 6 min L5 LE only   Seated HS stretch, piriformis   Sidelying arcs   SLR- neutral & ER   Bridge with ball bw knees- feet flat & in DF   Tandem stance- static and with head turns   Attempted heel raises & modified but too much pain in foot    Lateral stepping down hallway- weight in heels   Seated LAQ ball bw knees- upright posture  10/13   TPDN- Lt hip- piriformis, glut med/min, quadratus femoris; skilled palpation and monitoring applied   Piriformis stretch   Nu step L5 5 min   Walking around lower loop to encourage relaxed gait posture   Lt clams   Lt hip abd   LTR   Bridge pillow bw knees  10/11   Nu step 5 min L5 LE only   Manual- hs, ITB stretching, PROM hip flexion/IR/ER, post hip mobs at end range flexion/add   Shuttle- 2*25+12 double leg neutral & ER press, 25+12 single leg press in neutral   Sidelying hip abduction, clams   Theract- stairs  10/5   Nu step 5 min L4 Le only   Stretches: Supine HSS midline & lateral bias, Piriformis stretch   Manual: trigger point release Lt hip musculature   Lateral stepping in mini squat   Sit to stand ball bw knees   SLS with bar for support   Slow marching on airex  10/3  Nustep 5 min L5 LE only  Manual- trigger point release & roller to Lt hip  Piriformis stretch supine  Clam x30 ea  Bridge with ball squeeze  Stretching: hip flexors, HS, gastrocs  Standing hip hinge with glut set, 10b  Eval:  Manual therapy: trigger point release & stm to Lt hip abd group, rolling Lt hip following Edu in self release with tennis ball   HOME  EXERCISE PROGRAM: [id number] A Self STM with tennis ball                      Continue walking but consider more frequent with shorter duration  PATIENT EDUCATION:  Education details: TPDN & expected outcomes, what to look for in shoes: wide base with soft mid foot Person educated: Patient Education method: Explanation and Handouts Education comprehension: verbalized understanding and needs further education   ASSESSMENT:   CLINICAL IMPRESSION: Painful metatarsals limit ability to strengthen gastrocs and create push off in gait pattern. Plans to get new shoes now that the baby shower  is finished. Having cataract surgery tomorrow and will discuss clearance for exercise with surgeon.   REHAB POTENTIAL: Good   CLINICAL DECISION MAKING: Unstable/unpredictable   EVALUATION COMPLEXITY: High     GOALS: Goals reviewed with patient? Yes   SHORT TERM GOALS:   STG Name Target Date Goal status  1 Pt will be independent in daily stretching regimen Baseline: denies doing so at eval 08/25/2021 achieved                                                          LONG TERM GOALS:    LTG Name Target Date Goal status  1 Gross hip strength to 5/5 Baseline: see MMT outline 09/15/2021 INITIAL  2 Able to demo SLS for 10s bilaterally Baseline:unable at eval 09/15/2021 INITIAL  3 Pt will be able to don socks/shoes without reacher Baseline:requires reacher at eval 09/15/2021 INITIAL  4 Pt will be able to walk for at least 20 min without notable discomfort Baseline: constant at eval 09/15/2021 INITIAL                               PLAN: PT FREQUENCY: 1-2x/week   PT DURATION: 6 weeks   PLANNED INTERVENTIONS: Therapeutic exercises, Therapeutic activity, Neuro Muscular re-education, Balance training, Gait training, Patient/Family education, Joint mobilization, Stair training, Dry Needling, Cryotherapy, Moist heat, and Manual therapy   PLAN FOR NEXT SESSION: outcome of DN? How was the baby shower   Kamoria Lucien C. Jensyn Cambria PT, DPT 08/22/21 12:26 PM     [facility]  Patient name: LAIKEN SANDY MRN: [id number] DOB: 05-Jan-1946

## 2021-08-23 DIAGNOSIS — H2511 Age-related nuclear cataract, right eye: Secondary | ICD-10-CM | POA: Diagnosis not present

## 2021-08-23 DIAGNOSIS — H25811 Combined forms of age-related cataract, right eye: Secondary | ICD-10-CM | POA: Diagnosis not present

## 2021-08-29 ENCOUNTER — Encounter (HOSPITAL_BASED_OUTPATIENT_CLINIC_OR_DEPARTMENT_OTHER): Payer: Medicare PPO | Attending: Family Medicine | Admitting: Physical Therapy

## 2021-08-29 ENCOUNTER — Encounter (HOSPITAL_BASED_OUTPATIENT_CLINIC_OR_DEPARTMENT_OTHER): Payer: Self-pay | Admitting: Physical Therapy

## 2021-08-29 ENCOUNTER — Other Ambulatory Visit: Payer: Self-pay

## 2021-08-29 DIAGNOSIS — M25552 Pain in left hip: Secondary | ICD-10-CM | POA: Diagnosis not present

## 2021-08-29 NOTE — Therapy (Signed)
OUTPATIENT PHYSICAL THERAPY TREATMENT NOTE   Patient Name: Susan Carpenter MRN: [id number] DOB:September 22, 1946, 75 y.o., female Today's Date: 08/29/2021  PCP: Juluis Rainier, MD REFERRING PROVIDER: Juluis Rainier, MD   PT End of Session - 08/29/21 1015     Visit Number 7    Number of Visits 13    Date for PT Re-Evaluation 09/15/21    Authorization Type [employer] MCR    PT Start Time 1015    PT Stop Time 1055    PT Time Calculation (min) 40 min    Activity Tolerance Patient tolerated treatment well    Behavior During Therapy WFL for tasks assessed/performed             Past Medical History:  Diagnosis Date   Allergy    rhinitis   ANA positive    Arthritis    Complication of anesthesia    Constipation    Fibromyalgia    Frontal fibrosing alopecia    Dr. Corky Downs    GERD (gastroesophageal reflux disease)    Heart murmur    Helicobacter pylori (H. pylori) infection    s/p treatment   Hiatal hernia    Interstitial cystitis    Pain in joint    site unspecified   PONV (postoperative nausea and vomiting)    Psoriasis    Psoriatic arthritis (HCC)    Past Surgical History:  Procedure Laterality Date   BLADDER SURGERY     x2 for scar tissue   CHOLECYSTECTOMY  1995   cysts on ovaries     polyp removal  1984   from fallopian tube   ROTATOR CUFF REPAIR Left 06/2017   THUMB SURGERY Right 01/16/2016   TOTAL KNEE ARTHROPLASTY Right 08/01/2015   Procedure: RIGHT TOTAL KNEE ARTHROPLASTY;  Surgeon: Ollen Gross, MD;  Location: [facility];  Service: Orthopedics;  Laterality: Right;   TUBAL LIGATION     Patient Active Problem List   Diagnosis Date Noted   Status post left rotator cuff repair 10/01/2018   History of total knee replacement, right 10/01/2018   DDD (degenerative disc disease), cervical 10/01/2018   DDD (degenerative disc disease), lumbar 10/01/2018   Cough variant asthma vs uacs 02/02/2018   Chronic cough 12/20/2017   Other insomnia 01/15/2017   Other fatigue  01/15/2017   Trapezius muscle spasm 01/15/2017   Alopecia, scarring 09/21/2016   Primary osteoarthritis of both hands 09/19/2016   OA (osteoarthritis) of knee 08/01/2015   INTERSTITIAL CYSTITIS 01/31/2010   Fibromyalgia 01/31/2010   FATIGUE 01/31/2010   DYSPNEA/SHORTNESS OF BREATH 01/31/2010   FREQUENCY, URINARY 01/31/2010   Depression 01/24/2009   Migraines 09/04/2008   HELICOBACTER PYLORI INFECTION 10/14/2007   Allergic rhinitis 10/14/2007   HIATAL HERNIA 10/14/2007   ANA POSITIVE 10/14/2007   Hypothyroidism 10/10/2007   PAIN IN JOINT, SITE UNSPECIFIED 10/10/2007   WEIGHT GAIN 10/10/2007    REFERRING DIAG: REFERRING DIAG: M70.62 (ICD-10-CM) - Trochanteric bursitis, left hip    THERAPY DIAG:  Pain in left hip  PERTINENT HISTORY: PERTINENT HISTORY: Sjogren's syndrome, osteoarthritis, fibromyalgia, DDD, Rt TKA  PRECAUTIONS: none  SUBJECTIVE: Cataract eye does not feel fantastic. I am doing 10-12k steps a day, I have been busy and am hosting a book club tonight. Doing stretches and exercises- not as many exercises.   PAIN:  Are you having pain? yes 4/10 Lt post hip Tight, crampy    OBJECTIVE:  MUSCLE LENGTH: Hamstrings: Lt incr to 65 deg flexibility     POSTURE:  Wide BOS in  standing   Balance: unable to maintain stable SLS bilaterally for 5s   Plapation: concordant pain in glut med/min/TFL and piriformis   LE AROM/PROM:   A/PROM Right 08/03/2021 Left 08/03/2021 Left 10/25  Hip flexion       Hip extension       Hip abduction       Hip adduction       Hip internal rotation   25 40  Hip external rotation   48 50  Knee flexion       Knee extension       Ankle dorsiflexion       Ankle plantarflexion       Ankle inversion       Ankle eversion        (Blank rows = not tested)   LE MMT:   MMT Right 08/03/2021 Left 08/03/2021 10/11 10/18  Hip flexion 4/5 4/5 4+/5 Cramping in quads  Hip extension        Hip abduction   3/5 4/5 4+5  Hip adduction         Hip internal rotation        Hip external rotation        Knee flexion        Knee extension        Ankle dorsiflexion        Ankle plantarflexion        Ankle inversion        Ankle eversion         (Blank rows = not tested)       JOINT MOBILITY ASSESSMENT:  Unable to get to end feel in supine due to discomfort     GAIT: Comments: lacking straight line walking, mild trunk rotation and arm swing-limited by Lt shoulder pain    TODAY'S TREATMENT: 10/25  Manual therapy with TPDN: skilled palpation and monitoring (Lt piriformis, glut med/min) followed by STM  Supine piriformis stretch  Clams x20 each  Standing at bar: pull off squats, lateral lunges, SLS on airex  10/18    Nu step 6 min L5 LE only   Seated HS stretch, piriformis   Sidelying arcs   SLR- neutral & ER   Bridge with ball bw knees- feet flat & in DF   Tandem stance- static and with head turns   Attempted heel raises & modified but too much pain in foot    Lateral stepping down hallway- weight in heels   Seated LAQ ball bw knees- upright posture  10/13   TPDN- Lt hip- piriformis, glut med/min, quadratus femoris; skilled palpation and monitoring applied   Piriformis stretch   Nu step L5 5 min   Walking around lower loop to encourage relaxed gait posture   Lt clams   Lt hip abd   LTR   Bridge pillow bw knees  10/11   Nu step 5 min L5 LE only   Manual- hs, ITB stretching, PROM hip flexion/IR/ER, post hip mobs at end range flexion/add   Shuttle- 2*25+12 double leg neutral & ER press, 25+12 single leg press in neutral   Sidelying hip abduction, clams   Theract- stairs  10/5   Nu step 5 min L4 Le only   Stretches: Supine HSS midline & lateral bias, Piriformis stretch   Manual: trigger point release Lt hip musculature   Lateral stepping in mini squat   Sit to stand ball bw knees   SLS with bar for support   Slow marching on  airex  10/3  Nustep 5 min L5 LE only  Manual- trigger point release & roller  to Lt hip  Piriformis stretch supine  Clam x30 ea  Bridge with ball squeeze  Stretching: hip flexors, HS, gastrocs  Standing hip hinge with glut set, 10b  Eval:  Manual therapy: trigger point release & stm to Lt hip abd group, rolling Lt hip following Edu in self release with tennis ball   HOME EXERCISE PROGRAM: [id number] A Self STM with tennis ball                     Continue walking but consider more frequent with shorter duration  PATIENT EDUCATION:  Education details: TPDN & expected outcomes, what to look for in shoes: wide base with soft mid foot Person educated: Patient Education method: Explanation and Handouts Education comprehension: verbalized understanding and needs further education   ASSESSMENT:   CLINICAL IMPRESSION: Tension in Lt hip external rotators decreased with DN today and soreness following as expected. CKC exercises to encourage proper activation. Some proximal adductor tendon irritation noted today, suspect that this is as a result of biomechanical chain compensations from external rotators and will continue to monitor.   REHAB POTENTIAL: Good   CLINICAL DECISION MAKING: Unstable/unpredictable   EVALUATION COMPLEXITY: High     GOALS: Goals reviewed with patient? Yes   SHORT TERM GOALS:   STG Name Target Date Goal status  1 Pt will be independent in daily stretching regimen Baseline: denies doing so at eval 08/25/2021 achieved                                                          LONG TERM GOALS:    LTG Name Target Date Goal status  1 Gross hip strength to 5/5 Baseline: see MMT outline 09/15/2021 INITIAL  2 Able to demo SLS for 10s bilaterally Baseline:unable at eval 09/15/2021 INITIAL  3 Pt will be able to don socks/shoes without reacher Baseline:requires reacher at eval 09/15/2021 INITIAL  4 Pt will be able to walk for at least 20 min without notable discomfort Baseline: constant at eval 09/15/2021 INITIAL                                PLAN: PT FREQUENCY: 1-2x/week   PT DURATION: 6 weeks   PLANNED INTERVENTIONS: Therapeutic exercises, Therapeutic activity, Neuro Muscular re-education, Balance training, Gait training, Patient/Family education, Joint mobilization, Stair training, Dry Needling, Cryotherapy, Moist heat, and Manual therapy   PLAN FOR NEXT SESSION: outcome of DN? How was the baby shower  Havana Baldwin C. Jewelz Kobus PT, DPT 08/30/21 12:02 AM     [facility]  Patient name: SORAIDA VICKERS MRN: [id number] DOB: September 28, 1946

## 2021-08-30 DIAGNOSIS — H903 Sensorineural hearing loss, bilateral: Secondary | ICD-10-CM | POA: Diagnosis not present

## 2021-09-03 NOTE — Therapy (Signed)
OUTPATIENT PHYSICAL THERAPY TREATMENT NOTE   Patient Name: BETTYJEAN STEFANSKI MRN: [id number] DOB:[birth year], 75 y.o., female Today's Date: 09/03/2021  PCP: Juluis Rainier, MD REFERRING PROVIDER: Juluis Rainier, MD     Past Medical History:  Diagnosis Date   Allergy    rhinitis   ANA positive    Arthritis    Complication of anesthesia    Constipation    Fibromyalgia    Frontal fibrosing alopecia    Dr. Corky Downs    GERD (gastroesophageal reflux disease)    Heart murmur    Helicobacter pylori (H. pylori) infection    s/p treatment   Hiatal hernia    Interstitial cystitis    Pain in joint    site unspecified   PONV (postoperative nausea and vomiting)    Psoriasis    Psoriatic arthritis (HCC)    Past Surgical History:  Procedure Laterality Date   BLADDER SURGERY     x2 for scar tissue   CHOLECYSTECTOMY  1995   cysts on ovaries     polyp removal  1984   from fallopian tube   ROTATOR CUFF REPAIR Left 06/2017   THUMB SURGERY Right 01/16/2016   TOTAL KNEE ARTHROPLASTY Right 08/01/2015   Procedure: RIGHT TOTAL KNEE ARTHROPLASTY;  Surgeon: Ollen Gross, MD;  Location: [facility];  Service: Orthopedics;  Laterality: Right;   TUBAL LIGATION     Patient Active Problem List   Diagnosis Date Noted   Status post left rotator cuff repair 10/01/2018   History of total knee replacement, right 10/01/2018   DDD (degenerative disc disease), cervical 10/01/2018   DDD (degenerative disc disease), lumbar 10/01/2018   Cough variant asthma vs uacs 02/02/2018   Chronic cough 12/20/2017   Other insomnia 01/15/2017   Other fatigue 01/15/2017   Trapezius muscle spasm 01/15/2017   Alopecia, scarring 09/21/2016   Primary osteoarthritis of both hands 09/19/2016   OA (osteoarthritis) of knee 08/01/2015   INTERSTITIAL CYSTITIS 01/31/2010   Fibromyalgia 01/31/2010   FATIGUE 01/31/2010   DYSPNEA/SHORTNESS OF BREATH 01/31/2010   FREQUENCY, URINARY 01/31/2010   Depression 01/24/2009    Migraines 09/04/2008   HELICOBACTER PYLORI INFECTION 10/14/2007   Allergic rhinitis 10/14/2007   HIATAL HERNIA 10/14/2007   ANA POSITIVE 10/14/2007   Hypothyroidism 10/10/2007   PAIN IN JOINT, SITE UNSPECIFIED 10/10/2007   WEIGHT GAIN 10/10/2007    REFERRING DIAG: REFERRING DIAG: M70.62 (ICD-10-CM) - Trochanteric bursitis, left hip    THERAPY DIAG:  No diagnosis found.  PERTINENT HISTORY: PERTINENT HISTORY: Sjogren's syndrome, osteoarthritis, fibromyalgia, DDD, Rt TKA  PRECAUTIONS: none  SUBJECTIVE:I am off today. Sat in bleachers for over 2 hours this weekend, allergies are bad, over ate at [employer] and chocolate cake. My back is bothering me.   PAIN:  Are you having pain? yes 4/10 Lt lower back aching    OBJECTIVE:  MUSCLE LENGTH: Hamstrings: Lt incr to 65 deg flexibility  10/31 passive Lt SLR to 72 deg     POSTURE:  Wide BOS in standing   Balance: unable to maintain stable SLS bilaterally for 5s   Plapation: concordant pain in glut med/min/TFL and piriformis   LE AROM/PROM:   A/PROM Right 08/03/2021 Left 08/03/2021 Left 10/25  Hip flexion       Hip extension       Hip abduction       Hip adduction       Hip internal rotation   25 40  Hip external rotation   48 50  Knee flexion  Knee extension       Ankle dorsiflexion       Ankle plantarflexion       Ankle inversion       Ankle eversion        (Blank rows = not tested)   LE MMT:   MMT Right 08/03/2021 Left 08/03/2021 10/11 10/18  Hip flexion 4/5 4/5 4+/5 Cramping in quads  Hip extension        Hip abduction   3/5 4/5 4+5  Hip adduction        Hip internal rotation        Hip external rotation        Knee flexion        Knee extension        Ankle dorsiflexion        Ankle plantarflexion        Ankle inversion        Ankle eversion         (Blank rows = not tested)       JOINT MOBILITY ASSESSMENT:  Unable to get to end feel in supine due to discomfort      GAIT: Comments: lacking straight line walking, mild trunk rotation and arm swing-limited by Lt shoulder pain    TODAY'S TREATMENT: 10/31  Nustep 5 min Le LE only   Manual therapy: STM Lt hip- piriformis, glut med/min, TFL; QL; passive HS stretch  LTR  Supine ab set  Supine bridge with ball squeeze  Mod thomas stretch with leg off side of table  Sidelying hip abduction & adduction   10/25  Manual therapy with TPDN: skilled palpation and monitoring (Lt piriformis, glut med/min) followed by STM  Supine piriformis stretch  Clams x20 each  Standing at bar: pull off squats, lateral lunges, SLS on airex  10/18    Nu step 6 min L5 LE only   Seated HS stretch, piriformis   Sidelying arcs   SLR- neutral & ER   Bridge with ball bw knees- feet flat & in DF   Tandem stance- static and with head turns   Attempted heel raises & modified but too much pain in foot    Lateral stepping down hallway- weight in heels   Seated LAQ ball bw knees- upright posture  10/13   TPDN- Lt hip- piriformis, glut med/min, quadratus femoris; skilled palpation and monitoring applied   Piriformis stretch   Nu step L5 5 min   Walking around lower loop to encourage relaxed gait posture   Lt clams   Lt hip abd   LTR   Bridge pillow bw knees  10/11   Nu step 5 min L5 LE only   Manual- hs, ITB stretching, PROM hip flexion/IR/ER, post hip mobs at end range flexion/add   Shuttle- 2*25+12 double leg neutral & ER press, 25+12 single leg press in neutral   Sidelying hip abduction, clams   Theract- stairs  10/5   Nu step 5 min L4 Le only   Stretches: Supine HSS midline & lateral bias, Piriformis stretch   Manual: trigger point release Lt hip musculature   Lateral stepping in mini squat   Sit to stand ball bw knees   SLS with bar for support   Slow marching on airex  10/3  Nustep 5 min L5 LE only  Manual- trigger point release & roller to Lt hip  Piriformis stretch supine  Clam x30 ea  Bridge with  ball squeeze  Stretching: hip flexors, HS,  gastrocs  Standing hip hinge with glut set, 10b  Eval:  Manual therapy: trigger point release & stm to Lt hip abd group, rolling Lt hip following Edu in self release with tennis ball   HOME EXERCISE PROGRAM: [id number] A Self STM with tennis ball                     Continue walking but consider more frequent with shorter duration  PATIENT EDUCATION:  Education details: TPDN & expected outcomes, what to look for in shoes: wide base with soft mid foot Person educated: Patient Education method: Explanation and Handouts Education comprehension: verbalized understanding and needs further education   ASSESSMENT:   CLINICAL IMPRESSION: Tension reduced in Lt hip and QL which reduced concordant back pain. Improving flexibility in posterior chain. Able to demo proper core contraction. Cramping in Lt adductors continued in bridges. Lt hip drop in Rt SLS- corrected with cuing.     REHAB POTENTIAL: Good   CLINICAL DECISION MAKING: Unstable/unpredictable   EVALUATION COMPLEXITY: High     GOALS: Goals reviewed with patient? Yes   SHORT TERM GOALS:   STG Name Target Date Goal status  1 Pt will be independent in daily stretching regimen Baseline: denies doing so at eval 08/25/2021 achieved                                                          LONG TERM GOALS:    LTG Name Target Date Goal status  1 Gross hip strength to 5/5 Baseline: see MMT outline 09/15/2021 INITIAL  2 Able to demo SLS for 10s bilaterally Baseline:unable at eval 09/15/2021 INITIAL  3 Pt will be able to don socks/shoes without reacher Baseline:requires reacher at eval 09/15/2021 INITIAL  4 Pt will be able to walk for at least 20 min without notable discomfort Baseline: constant at eval 09/15/2021 INITIAL                               PLAN: PT FREQUENCY: 1-2x/week   PT DURATION: 6 weeks   PLANNED INTERVENTIONS: Therapeutic exercises, Therapeutic activity,  Neuro Muscular re-education, Balance training, Gait training, Patient/Family education, Joint mobilization, Stair training, Dry Needling, Cryotherapy, Moist heat, and Manual therapy   PLAN FOR NEXT SESSION: cont to progress balance, seated core  Demetrius Barrell C. Jodie Cavey PT, DPT 09/04/21 10:13 AM      [facility]  Patient name: ADY HEIMANN MRN: [id number] DOB: 1946/04/17

## 2021-09-04 ENCOUNTER — Ambulatory Visit (HOSPITAL_BASED_OUTPATIENT_CLINIC_OR_DEPARTMENT_OTHER): Payer: Medicare PPO | Admitting: Physical Therapy

## 2021-09-04 ENCOUNTER — Other Ambulatory Visit: Payer: Self-pay

## 2021-09-04 ENCOUNTER — Encounter (HOSPITAL_BASED_OUTPATIENT_CLINIC_OR_DEPARTMENT_OTHER): Payer: Self-pay | Admitting: Physical Therapy

## 2021-09-04 DIAGNOSIS — M25552 Pain in left hip: Secondary | ICD-10-CM

## 2021-09-07 NOTE — Therapy (Signed)
OUTPATIENT PHYSICAL THERAPY TREATMENT NOTE   Patient Name: Susan Carpenter MRN: [id number] DOB:May 09, 1946, 75 y.o., female Today's Date: 09/08/2021  PCP: Juluis Rainier, MD REFERRING PROVIDER: Juluis Rainier, MD   PT End of Session - 09/08/21 0918     Visit Number 9    Number of Visits 13    Date for PT Re-Evaluation 09/15/21    Authorization Type [employer] MCR    PT Start Time 0918    PT Stop Time 0959    PT Time Calculation (min) 41 min    Activity Tolerance Patient tolerated treatment well    Behavior During Therapy WFL for tasks assessed/performed              Past Medical History:  Diagnosis Date   Allergy    rhinitis   ANA positive    Arthritis    Complication of anesthesia    Constipation    Fibromyalgia    Frontal fibrosing alopecia    Dr. Corky Downs    GERD (gastroesophageal reflux disease)    Heart murmur    Helicobacter pylori (H. pylori) infection    s/p treatment   Hiatal hernia    Interstitial cystitis    Pain in joint    site unspecified   PONV (postoperative nausea and vomiting)    Psoriasis    Psoriatic arthritis (HCC)    Past Surgical History:  Procedure Laterality Date   BLADDER SURGERY     x2 for scar tissue   CHOLECYSTECTOMY  1995   cysts on ovaries     polyp removal  1984   from fallopian tube   ROTATOR CUFF REPAIR Left 06/2017   THUMB SURGERY Right 01/16/2016   TOTAL KNEE ARTHROPLASTY Right 08/01/2015   Procedure: RIGHT TOTAL KNEE ARTHROPLASTY;  Surgeon: Ollen Gross, MD;  Location: [facility];  Service: Orthopedics;  Laterality: Right;   TUBAL LIGATION     Patient Active Problem List   Diagnosis Date Noted   Status post left rotator cuff repair 10/01/2018   History of total knee replacement, right 10/01/2018   DDD (degenerative disc disease), cervical 10/01/2018   DDD (degenerative disc disease), lumbar 10/01/2018   Cough variant asthma vs uacs 02/02/2018   Chronic cough 12/20/2017   Other insomnia 01/15/2017   Other fatigue  01/15/2017   Trapezius muscle spasm 01/15/2017   Alopecia, scarring 09/21/2016   Primary osteoarthritis of both hands 09/19/2016   OA (osteoarthritis) of knee 08/01/2015   INTERSTITIAL CYSTITIS 01/31/2010   Fibromyalgia 01/31/2010   FATIGUE 01/31/2010   DYSPNEA/SHORTNESS OF BREATH 01/31/2010   FREQUENCY, URINARY 01/31/2010   Depression 01/24/2009   Migraines 09/04/2008   HELICOBACTER PYLORI INFECTION 10/14/2007   Allergic rhinitis 10/14/2007   HIATAL HERNIA 10/14/2007   ANA POSITIVE 10/14/2007   Hypothyroidism 10/10/2007   PAIN IN JOINT, SITE UNSPECIFIED 10/10/2007   WEIGHT GAIN 10/10/2007    REFERRING DIAG: REFERRING DIAG: M70.62 (ICD-10-CM) - Trochanteric bursitis, left hip    THERAPY DIAG:  Pain in left hip  PERTINENT HISTORY: PERTINENT HISTORY: Sjogren's syndrome, osteoarthritis, fibromyalgia, DDD, Rt TKA  PRECAUTIONS: none  SUBJECTIVE:still having some pain in back of Lt lower back and SIJ.    PAIN:  Are you having pain? yes 2-3/10 Lt lower back aching Aggrivating- stretching hurts but feels good Easing- stretching   OBJECTIVE:  MUSCLE LENGTH: Hamstrings: Lt incr to 65 deg flexibility  10/31 passive Lt SLR to 72 deg     POSTURE:  Wide BOS in standing   Balance: unable  to maintain stable SLS bilaterally for 5s   Plapation: concordant pain in glut med/min/TFL and piriformis   LE AROM/PROM:   A/PROM Right 08/03/2021 Left 08/03/2021 Left 10/25  Hip flexion       Hip extension       Hip abduction       Hip adduction       Hip internal rotation   25 40  Hip external rotation   48 50  Knee flexion       Knee extension       Ankle dorsiflexion       Ankle plantarflexion       Ankle inversion       Ankle eversion        (Blank rows = not tested)   LE MMT:   MMT Right 08/03/2021 Left 08/03/2021 10/11 10/18  Hip flexion 4/5 4/5 4+/5 Cramping in quads  Hip extension        Hip abduction   3/5 4/5 4+5  Hip adduction        Hip internal rotation         Hip external rotation        Knee flexion        Knee extension        Ankle dorsiflexion        Ankle plantarflexion        Ankle inversion        Ankle eversion         (Blank rows = not tested)       JOINT MOBILITY ASSESSMENT:  Unable to get to end feel in supine due to discomfort     GAIT: Comments: lacking straight line walking, mild trunk rotation and arm swing-limited by Lt shoulder pain    TODAY'S TREATMENT: 11/3  Hesch self correction for Lt post illium  Manual therapy: sidelying STM to priiformis, glut med/min/max; joint mobs: prone ant rotation of illium with anterior pressure to upper right quadrant of sacrum.   Nu step 5 min L5 following manual therapy  Bar pull off squats  Seated abdominal engagement  10/31  Nustep 5 min Le LE only   Manual therapy: STM Lt hip- piriformis, glut med/min, TFL; QL; passive HS stretch  LTR  Supine ab set  Supine bridge with ball squeeze  Mod thomas stretch with leg off side of table  Sidelying hip abduction & adduction   10/25  Manual therapy with TPDN: skilled palpation and monitoring (Lt piriformis, glut med/min) followed by STM  Supine piriformis stretch  Clams x20 each  Standing at bar: pull off squats, lateral lunges, SLS on airex  10/18    Nu step 6 min L5 LE only   Seated HS stretch, piriformis   Sidelying arcs   SLR- neutral & ER   Bridge with ball bw knees- feet flat & in DF   Tandem stance- static and with head turns   Attempted heel raises & modified but too much pain in foot    Lateral stepping down hallway- weight in heels   Seated LAQ ball bw knees- upright posture  10/13   TPDN- Lt hip- piriformis, glut med/min, quadratus femoris; skilled palpation and monitoring applied   Piriformis stretch   Nu step L5 5 min   Walking around lower loop to encourage relaxed gait posture   Lt clams   Lt hip abd   LTR   Bridge pillow bw knees  10/11   Nu step 5 min L5  LE only   Manual- hs, ITB  stretching, PROM hip flexion/IR/ER, post hip mobs at end range flexion/add   Shuttle- 2*25+12 double leg neutral & ER press, 25+12 single leg press in neutral   Sidelying hip abduction, clams   Theract- stairs  10/5   Nu step 5 min L4 Le only   Stretches: Supine HSS midline & lateral bias, Piriformis stretch   Manual: trigger point release Lt hip musculature   Lateral stepping in mini squat   Sit to stand ball bw knees   SLS with bar for support   Slow marching on airex  10/3  Nustep 5 min L5 LE only  Manual- trigger point release & roller to Lt hip  Piriformis stretch supine  Clam x30 ea  Bridge with ball squeeze  Stretching: hip flexors, HS, gastrocs  Standing hip hinge with glut set, 10b  Eval:  Manual therapy: trigger point release & stm to Lt hip abd group, rolling Lt hip following Edu in self release with tennis ball   HOME EXERCISE PROGRAM: [id number] A Self STM with tennis ball                     Continue walking but consider more frequent with shorter duration  PATIENT EDUCATION:  Education details: TPDN & expected outcomes, what to look for in shoes: wide base with soft mid foot Person educated: Patient Education method: Explanation and Handouts Education comprehension: verbalized understanding and needs further education   ASSESSMENT:   CLINICAL IMPRESSION: Notable innominate rotation with functional LLD (Lt longer) corrected today with self correction and manual therapy with pt reporting resolution of Lt SIJ pain. Encouraged equal posture in ADLs and avoiding crossing legs. Will hold on single leg exercises for the next 3 days.       REHAB POTENTIAL: Good   CLINICAL DECISION MAKING: Unstable/unpredictable   EVALUATION COMPLEXITY: High     GOALS: Goals reviewed with patient? Yes   SHORT TERM GOALS:   STG Name Target Date Goal status  1 Pt will be independent in daily stretching regimen Baseline: denies doing so at eval 08/25/2021 achieved                                                           LONG TERM GOALS:    LTG Name Target Date Goal status  1 Gross hip strength to 5/5 Baseline: see MMT outline 09/15/2021 INITIAL  2 Able to demo SLS for 10s bilaterally Baseline:unable at eval 09/15/2021 INITIAL  3 Pt will be able to don socks/shoes without reacher Baseline:requires reacher at eval 09/15/2021 INITIAL  4 Pt will be able to walk for at least 20 min without notable discomfort Baseline: constant at eval 09/15/2021 INITIAL                               PLAN: PT FREQUENCY: 1-2x/week   PT DURATION: 6 weeks   PLANNED INTERVENTIONS: Therapeutic exercises, Therapeutic activity, Neuro Muscular re-education, Balance training, Gait training, Patient/Family education, Joint mobilization, Stair training, Dry Needling, Cryotherapy, Moist heat, and Manual therapy   PLAN FOR NEXT SESSION: check innom rotation & revisit single leg.   Glendi Mohiuddin C. Kerrin Markman PT, DPT 09/08/21 10:04 AM      [facility]  Patient name: Susan Carpenter MRN: [id number] DOB: 03-10-1946

## 2021-09-08 ENCOUNTER — Other Ambulatory Visit: Payer: Self-pay

## 2021-09-08 ENCOUNTER — Encounter (HOSPITAL_BASED_OUTPATIENT_CLINIC_OR_DEPARTMENT_OTHER): Payer: Self-pay | Admitting: Physical Therapy

## 2021-09-08 ENCOUNTER — Ambulatory Visit (HOSPITAL_BASED_OUTPATIENT_CLINIC_OR_DEPARTMENT_OTHER): Payer: Medicare PPO | Attending: Physician Assistant | Admitting: Physical Therapy

## 2021-09-08 DIAGNOSIS — M25552 Pain in left hip: Secondary | ICD-10-CM | POA: Insufficient documentation

## 2021-09-11 NOTE — Therapy (Incomplete)
OUTPATIENT PHYSICAL THERAPY TREATMENT NOTE   Patient Name: Susan Carpenter MRN: [id number] DOB:12/07/1945, 75 y.o., female Today's Date: 09/11/2021  PCP: Juluis Rainier, MD REFERRING PROVIDER: Juluis Rainier, MD      Past Medical History:  Diagnosis Date   Allergy    rhinitis   ANA positive    Arthritis    Complication of anesthesia    Constipation    Fibromyalgia    Frontal fibrosing alopecia    Dr. Corky Downs    GERD (gastroesophageal reflux disease)    Heart murmur    Helicobacter pylori (H. pylori) infection    s/p treatment   Hiatal hernia    Interstitial cystitis    Pain in joint    site unspecified   PONV (postoperative nausea and vomiting)    Psoriasis    Psoriatic arthritis (HCC)    Past Surgical History:  Procedure Laterality Date   BLADDER SURGERY     x2 for scar tissue   CHOLECYSTECTOMY  1995   cysts on ovaries     polyp removal  1984   from fallopian tube   ROTATOR CUFF REPAIR Left 06/2017   THUMB SURGERY Right 01/16/2016   TOTAL KNEE ARTHROPLASTY Right 08/01/2015   Procedure: RIGHT TOTAL KNEE ARTHROPLASTY;  Surgeon: Ollen Gross, MD;  Location: [facility];  Service: Orthopedics;  Laterality: Right;   TUBAL LIGATION     Patient Active Problem List   Diagnosis Date Noted   Status post left rotator cuff repair 10/01/2018   History of total knee replacement, right 10/01/2018   DDD (degenerative disc disease), cervical 10/01/2018   DDD (degenerative disc disease), lumbar 10/01/2018   Cough variant asthma vs uacs 02/02/2018   Chronic cough 12/20/2017   Other insomnia 01/15/2017   Other fatigue 01/15/2017   Trapezius muscle spasm 01/15/2017   Alopecia, scarring 09/21/2016   Primary osteoarthritis of both hands 09/19/2016   OA (osteoarthritis) of knee 08/01/2015   INTERSTITIAL CYSTITIS 01/31/2010   Fibromyalgia 01/31/2010   FATIGUE 01/31/2010   DYSPNEA/SHORTNESS OF BREATH 01/31/2010   FREQUENCY, URINARY 01/31/2010   Depression 01/24/2009    Migraines 09/04/2008   HELICOBACTER PYLORI INFECTION 10/14/2007   Allergic rhinitis 10/14/2007   HIATAL HERNIA 10/14/2007   ANA POSITIVE 10/14/2007   Hypothyroidism 10/10/2007   PAIN IN JOINT, SITE UNSPECIFIED 10/10/2007   WEIGHT GAIN 10/10/2007    REFERRING DIAG: REFERRING DIAG: M70.62 (ICD-10-CM) - Trochanteric bursitis, left hip    THERAPY DIAG:  No diagnosis found.  PERTINENT HISTORY: PERTINENT HISTORY: Sjogren's syndrome, osteoarthritis, fibromyalgia, DDD, Rt TKA  PRECAUTIONS: none  SUBJECTIVE: ***   PAIN:  Are you having pain? yes ***/10 Lt lower back aching Aggrivating- stretching hurts but feels good Easing- stretching   OBJECTIVE:  MUSCLE LENGTH: Hamstrings: Lt incr to 65 deg flexibility  10/31 passive Lt SLR to 72 deg     POSTURE:  Wide BOS in standing   Balance: unable to maintain stable SLS bilaterally for 5s   Plapation: concordant pain in glut med/min/TFL and piriformis   LE AROM/PROM:   A/PROM Right 08/03/2021 Left 08/03/2021 Left 10/25  Hip flexion       Hip extension       Hip abduction       Hip adduction       Hip internal rotation   25 40  Hip external rotation   48 50  Knee flexion       Knee extension       Ankle dorsiflexion  Ankle plantarflexion       Ankle inversion       Ankle eversion        (Blank rows = not tested)   LE MMT:   MMT Right 08/03/2021 Left 08/03/2021 10/11 10/18  Hip flexion 4/5 4/5 4+/5 Cramping in quads  Hip extension        Hip abduction   3/5 4/5 4+5  Hip adduction        Hip internal rotation        Hip external rotation        Knee flexion        Knee extension        Ankle dorsiflexion        Ankle plantarflexion        Ankle inversion        Ankle eversion         (Blank rows = not tested)       JOINT MOBILITY ASSESSMENT:  Unable to get to end feel in supine due to discomfort     GAIT: Comments: lacking straight line walking, mild trunk rotation and arm swing-limited by Lt  shoulder pain    TODAY'S TREATMENT: 11/8  ***  11/3  Hesch self correction for Lt post illium  Manual therapy: sidelying STM to priiformis, glut med/min/max; joint mobs: prone ant rotation of illium with anterior pressure to upper right quadrant of sacrum.   Nu step 5 min L5 following manual therapy  Bar pull off squats  Seated abdominal engagement  10/31  Nustep 5 min Le LE only   Manual therapy: STM Lt hip- piriformis, glut med/min, TFL; QL; passive HS stretch  LTR  Supine ab set  Supine bridge with ball squeeze  Mod thomas stretch with leg off side of table  Sidelying hip abduction & adduction   10/25  Manual therapy with TPDN: skilled palpation and monitoring (Lt piriformis, glut med/min) followed by STM  Supine piriformis stretch  Clams x20 each  Standing at bar: pull off squats, lateral lunges, SLS on airex  10/18    Nu step 6 min L5 LE only   Seated HS stretch, piriformis   Sidelying arcs   SLR- neutral & ER   Bridge with ball bw knees- feet flat & in DF   Tandem stance- static and with head turns   Attempted heel raises & modified but too much pain in foot    Lateral stepping down hallway- weight in heels   Seated LAQ ball bw knees- upright posture  10/13   TPDN- Lt hip- piriformis, glut med/min, quadratus femoris; skilled palpation and monitoring applied   Piriformis stretch   Nu step L5 5 min   Walking around lower loop to encourage relaxed gait posture   Lt clams   Lt hip abd   LTR   Bridge pillow bw knees  10/11   Nu step 5 min L5 LE only   Manual- hs, ITB stretching, PROM hip flexion/IR/ER, post hip mobs at end range flexion/add   Shuttle- 2*25+12 double leg neutral & ER press, 25+12 single leg press in neutral   Sidelying hip abduction, clams   Theract- stairs  10/5   Nu step 5 min L4 Le only   Stretches: Supine HSS midline & lateral bias, Piriformis stretch   Manual: trigger point release Lt hip musculature   Lateral stepping in mini  squat   Sit to stand ball bw knees   SLS with bar for support  Slow marching on airex  10/3  Nustep 5 min L5 LE only  Manual- trigger point release & roller to Lt hip  Piriformis stretch supine  Clam x30 ea  Bridge with ball squeeze  Stretching: hip flexors, HS, gastrocs  Standing hip hinge with glut set, 10b  Eval:  Manual therapy: trigger point release & stm to Lt hip abd group, rolling Lt hip following Edu in self release with tennis ball   HOME EXERCISE PROGRAM: [id number] A Self STM with tennis ball                     Continue walking but consider more frequent with shorter duration  PATIENT EDUCATION:  Education details: TPDN & expected outcomes, what to look for in shoes: wide base with soft mid foot Person educated: Patient Education method: Explanation and Handouts Education comprehension: verbalized understanding and needs further education   ASSESSMENT:   CLINICAL IMPRESSION: ***  Notable innominate rotation with functional LLD (Lt longer) corrected today with self correction and manual therapy with pt reporting resolution of Lt SIJ pain. Encouraged equal posture in ADLs and avoiding crossing legs. Will hold on single leg exercises for the next 3 days.       REHAB POTENTIAL: Good   CLINICAL DECISION MAKING: Unstable/unpredictable   EVALUATION COMPLEXITY: High     GOALS: Goals reviewed with patient? Yes   SHORT TERM GOALS:   STG Name Target Date Goal status  1 Pt will be independent in daily stretching regimen Baseline: denies doing so at eval 08/25/2021 achieved                                                          LONG TERM GOALS:    LTG Name Target Date Goal status  1 Gross hip strength to 5/5 Baseline: see MMT outline 09/15/2021 INITIAL  2 Able to demo SLS for 10s bilaterally Baseline:unable at eval 09/15/2021 INITIAL  3 Pt will be able to don socks/shoes without reacher Baseline:requires reacher at eval 09/15/2021 INITIAL  4 Pt  will be able to walk for at least 20 min without notable discomfort Baseline: constant at eval 09/15/2021 INITIAL                               PLAN: PT FREQUENCY: 1-2x/week   PT DURATION: 6 weeks   PLANNED INTERVENTIONS: Therapeutic exercises, Therapeutic activity, Neuro Muscular re-education, Balance training, Gait training, Patient/Family education, Joint mobilization, Stair training, Dry Needling, Cryotherapy, Moist heat, and Manual therapy   PLAN FOR NEXT SESSION: check innom rotation & revisit single leg.   Andreina Outten C. Kalli Greenfield PT, DPT 09/11/21 8:48 PM      [facility]  Patient name: Susan Carpenter MRN: [id number] DOB: May 14, 1946

## 2021-09-12 ENCOUNTER — Ambulatory Visit (HOSPITAL_BASED_OUTPATIENT_CLINIC_OR_DEPARTMENT_OTHER): Payer: Medicare PPO | Admitting: Physical Therapy

## 2021-09-13 DIAGNOSIS — H2512 Age-related nuclear cataract, left eye: Secondary | ICD-10-CM | POA: Diagnosis not present

## 2021-09-13 DIAGNOSIS — H25812 Combined forms of age-related cataract, left eye: Secondary | ICD-10-CM | POA: Diagnosis not present

## 2021-09-15 ENCOUNTER — Ambulatory Visit (HOSPITAL_BASED_OUTPATIENT_CLINIC_OR_DEPARTMENT_OTHER): Payer: Medicare PPO | Admitting: Physical Therapy

## 2021-09-15 ENCOUNTER — Other Ambulatory Visit: Payer: Self-pay

## 2021-09-15 ENCOUNTER — Encounter (HOSPITAL_BASED_OUTPATIENT_CLINIC_OR_DEPARTMENT_OTHER): Payer: Self-pay | Admitting: Physical Therapy

## 2021-09-15 DIAGNOSIS — M25552 Pain in left hip: Secondary | ICD-10-CM

## 2021-09-15 NOTE — Therapy (Signed)
OUTPATIENT PHYSICAL THERAPY TREATMENT NOTE   Patient Name: Susan Carpenter MRN: [id number] DOB:Feb 23, 1946, 75 y.o., female Today's Date: 09/15/2021  PCP: Leighton Ruff, MD (Inactive) REFERRING PROVIDER: Leighton Ruff, MD   PT End of Session - 09/15/21 0932     Visit Number 10    Number of Visits 13    Date for PT Re-Evaluation 09/15/21    Authorization Type [employer] M[employer]    PT Start Time 0930    PT Stop Time 1003    PT Time Calculation (min) 33 min    Activity Tolerance Patient tolerated treatment well    Behavior During Therapy WFL for tasks assessed/performed              Past Medical History:  Diagnosis Date   Allergy    rhinitis   ANA positive    Arthritis    Complication of anesthesia    Constipation    Fibromyalgia    Frontal fibrosing alopecia    Dr. Irish Elders    GERD (gastroesophageal reflux disease)    Heart murmur    Helicobacter pylori (H. pylori) infection    s/p treatment   Hiatal hernia    Interstitial cystitis    Pain in joint    site unspecified   PONV (postoperative nausea and vomiting)    Psoriasis    Psoriatic arthritis ([place])    Past Surgical History:  Procedure Laterality Date   BLADDER SURGERY     x2 for scar tissue   CHOLECYSTECTOMY  1995   cysts on ovaries     polyp removal  1984   from fallopian tube   ROTATOR CUFF REPAIR Left 06/2017   THUMB SURGERY Right 01/16/2016   TOTAL KNEE ARTHROPLASTY Right 08/01/2015   Procedure: RIGHT TOTAL KNEE ARTHROPLASTY;  Surgeon: Gaynelle Arabian, MD;  Location: [facility];  Service: Orthopedics;  Laterality: Right;   TUBAL LIGATION     Patient Active Problem List   Diagnosis Date Noted   Status post left rotator cuff repair 10/01/2018   History of total knee replacement, right 10/01/2018   DDD (degenerative disc disease), cervical 10/01/2018   DDD (degenerative disc disease), lumbar 10/01/2018   Cough variant asthma vs uacs 02/02/2018   Chronic cough 12/20/2017   Other insomnia 01/15/2017    Other fatigue 01/15/2017   Trapezius muscle spasm 01/15/2017   Alopecia, scarring 09/21/2016   Primary osteoarthritis of both hands 09/19/2016   OA (osteoarthritis) of knee 08/01/2015   INTERSTITIAL CYSTITIS 01/31/2010   Fibromyalgia 01/31/2010   FATIGUE 01/31/2010   DYSPNEA/SHORTNESS OF BREATH 01/31/2010   FREQUENCY, URINARY 01/31/2010   Depression 01/24/2009   Migraines 74/10/8785   HELICOBACTER PYLORI INFECTION 10/14/2007   Allergic rhinitis 10/14/2007   HIATAL HERNIA 10/14/2007   ANA POSITIVE 10/14/2007   Hypothyroidism 10/10/2007   PAIN IN JOINT, SITE UNSPECIFIED 10/10/2007   WEIGHT GAIN 10/10/2007    REFERRING DIAG: REFERRING DIAG: M70.62 (ICD-10-CM) - Trochanteric bursitis, left hip    THERAPY DIAG:  Pain in left hip  PERTINENT HISTORY: PERTINENT HISTORY: Sjogren's syndrome, osteoarthritis, fibromyalgia, DDD, Rt TKA  PRECAUTIONS: none  SUBJECTIVE: still feel some of that soreness in Lt SIJ/buttock region but it is much better   PAIN:  Are you having pain? yes 1-2/10 Lt lower back aching Aggrivating- stretching hurts but feels good Easing- stretching   OBJECTIVE:  MUSCLE LENGTH: Hamstrings: Lt incr to 65 deg flexibility  10/31 passive Lt SLR to 72 deg 11/11 72 deg passive Lt SLR HS flexibility  POSTURE:  Wide BOS in standing   Balance: 3s on Rt, 10s on Lt   Plapation: concordant pain in glut med/min/TFL and piriformis   LE AROM/PROM:  no discomfort with testin g11/11   A/PROM Right 08/03/2021 Left 08/03/2021 Left 10/25 Left  11/11  Hip flexion        Hip extension        Hip abduction        Hip adduction        Hip internal rotation   25 40 32  Hip external rotation   48 50 46  Knee flexion        Knee extension        Ankle dorsiflexion        Ankle plantarflexion        Ankle inversion        Ankle eversion         (Blank rows = not tested)   LE MMT:   MMT Right 08/03/2021 Left 08/03/2021 10/11 10/18 11/11  Left  Hip flexion  4/5 4/5 4+/5 Cramping in quads 5/5  Hip extension         Hip abduction   3/5 4/5 4+5 5/5  Hip adduction         Hip internal rotation         Hip external rotation         Knee flexion         Knee extension         Ankle dorsiflexion         Ankle plantarflexion         Ankle inversion         Ankle eversion          (Blank rows = not tested)       JOINT MOBILITY ASSESSMENT:  End feels wFL without pain     GAIT: WFL with ability to maintain straight line path   TODAY'S TREATMENT: 11/11  Manual: STM to Lt hip  11/3  Hesch self correction for Lt post illium  Manual therapy: sidelying STM to priiformis, glut med/min/max; joint mobs: prone ant rotation of illium with anterior pressure to upper right quadrant of sacrum.   Nu step 5 min L5 following manual therapy  Bar pull off squats  Seated abdominal engagement  10/31  Nustep 5 min Le LE only   Manual therapy: STM Lt hip- piriformis, glut med/min, TFL; QL; passive HS stretch  LTR  Supine ab set  Supine bridge with ball squeeze  Mod thomas stretch with leg off side of table  Sidelying hip abduction & adduction   10/25  Manual therapy with TPDN: skilled palpation and monitoring (Lt piriformis, glut med/min) followed by STM  Supine piriformis stretch  Clams x20 each  Standing at bar: pull off squats, lateral lunges, SLS on airex  10/18    Nu step 6 min L5 LE only   Seated HS stretch, piriformis   Sidelying arcs   SLR- neutral & ER   Bridge with ball bw knees- feet flat & in DF   Tandem stance- static and with head turns   Attempted heel raises & modified but too much pain in foot    Lateral stepping down hallway- weight in heels   Seated LAQ ball bw knees- upright posture  10/13   TPDN- Lt hip- piriformis, glut med/min, quadratus femoris; skilled palpation and monitoring applied   Piriformis stretch   Nu step L5 5 min  Walking around lower loop to encourage relaxed gait posture   Lt clams   Lt hip  abd   LTR   Bridge pillow bw knees  10/11   Nu step 5 min L5 LE only   Manual- hs, ITB stretching, PROM hip flexion/IR/ER, post hip mobs at end range flexion/add   Shuttle- 2*25+12 double leg neutral & ER press, 25+12 single leg press in neutral   Sidelying hip abduction, clams   Theract- stairs  10/5   Nu step 5 min L4 Le only   Stretches: Supine HSS midline & lateral bias, Piriformis stretch   Manual: trigger point release Lt hip musculature   Lateral stepping in mini squat   Sit to stand ball bw knees   SLS with bar for support   Slow marching on airex  10/3  Nustep 5 min L5 LE only  Manual- trigger point release & roller to Lt hip  Piriformis stretch supine  Clam x30 ea  Bridge with ball squeeze  Stretching: hip flexors, HS, gastrocs  Standing hip hinge with glut set, 10b  Eval:  Manual therapy: trigger point release & stm to Lt hip abd group, rolling Lt hip following Edu in self release with tennis ball   HOME EXERCISE PROGRAM: [id number] A Self STM with tennis ball                     Continue walking but consider more frequent with shorter duration  PATIENT EDUCATION:  Education details: TPDN & expected outcomes, what to look for in shoes: wide base with soft mid foot Person educated: Patient Education method: Explanation and Handouts Education comprehension: verbalized understanding and needs further education   ASSESSMENT:   CLINICAL IMPRESSION: Reviewed goals today and performed all objective measures. Pt has met all of her goals at this time and is prepared for d/c to independent program. Encouraged her to continue HEP and awareness of habits in ADLs and to contact me with any further questions.       REHAB POTENTIAL: Good   CLINICAL DECISION MAKING: Unstable/unpredictable   EVALUATION COMPLEXITY: High     GOALS: Goals reviewed with patient? Yes   SHORT TERM GOALS:   STG Name Target Date Goal status  1 Pt will be independent in daily  stretching regimen Baseline: denies doing so at eval 08/25/2021 achieved                                                          LONG TERM GOALS:    LTG Name Target Date Goal status  1 Gross hip strength to 5/5 Baseline: see MMT outline 09/15/2021 achieved  2 Able to demo SLS for 10s bilaterally Baseline: able to demo on Lt, able to correct LOB after 3s on Rt with placing Lt foot on floor 09/15/2021 Partially met  3 Pt will be able to don socks/shoes without reaching Baseline: able to bring foot to knee 09/15/2021 achieved  4 Pt will be able to walk for at least 20 min without notable discomfort Baseline: able to do so today 09/15/2021 achieved                               PLAN: PT FREQUENCY: 1-2x/week   PT DURATION:  6 weeks   PLANNED INTERVENTIONS: Therapeutic exercises, Therapeutic activity, Neuro Muscular re-education, Balance training, Gait training, Patient/Family education, Joint mobilization, Stair training, Dry Needling, Cryotherapy, Moist heat, and Manual therapy   PLAN FOR NEXT SESSION: n/a  Donato Studley C. Kymiah Araiza PT, DPT 09/15/21 10:07 AM      [facility]  Patient name: Susan Carpenter MRN: [id number] DOB: 1946/06/18  PHYSICAL THERAPY DISCHARGE SUMMARY  Visits from Start of Care: 10  Current functional level related to goals / functional outcomes: See above   Remaining deficits: See above   Education / Equipment: Anatomy of condition, POC, HEP, exercise form/rationale   Patient agrees to discharge. Patient goals were met. Patient is being discharged due to meeting the stated rehab goals.

## 2021-09-19 ENCOUNTER — Encounter (HOSPITAL_BASED_OUTPATIENT_CLINIC_OR_DEPARTMENT_OTHER): Payer: Self-pay

## 2021-09-19 ENCOUNTER — Ambulatory Visit (HOSPITAL_BASED_OUTPATIENT_CLINIC_OR_DEPARTMENT_OTHER): Payer: Medicare PPO | Admitting: Physical Therapy

## 2021-10-21 ENCOUNTER — Other Ambulatory Visit: Payer: Self-pay | Admitting: Internal Medicine

## 2021-10-25 ENCOUNTER — Telehealth: Payer: Self-pay | Admitting: Internal Medicine

## 2021-10-25 NOTE — Telephone Encounter (Signed)
I have called the pt and there was no answer. LMTCB 

## 2021-10-26 ENCOUNTER — Other Ambulatory Visit: Payer: Self-pay | Admitting: Rheumatology

## 2021-10-27 NOTE — Telephone Encounter (Signed)
Next Visit: 02/14/2022  Last Visit: 07/18/2021  Last Fill: 06/29/2021  DX: Fibromyalgia  Current Dose per office note on 07/18/2021: methocarbamol 500 mg twice daily as needed for muscle spasms.  Okay to refill robaxin?

## 2021-11-01 MED ORDER — ESOMEPRAZOLE MAGNESIUM 40 MG PO CPDR: DELAYED_RELEASE_CAPSULE | ORAL | 0 refills | Status: DC

## 2021-11-01 NOTE — Telephone Encounter (Signed)
Patient has been scheduled for OV and refill sent in for 1 month. Nothing further needed at this time.   Next Appt With Pulmonology Sandrea Hughs, MD) 11/22/2021 at 8:45 AM

## 2021-11-15 DIAGNOSIS — L821 Other seborrheic keratosis: Secondary | ICD-10-CM | POA: Diagnosis not present

## 2021-11-15 DIAGNOSIS — L853 Xerosis cutis: Secondary | ICD-10-CM | POA: Diagnosis not present

## 2021-11-15 DIAGNOSIS — L570 Actinic keratosis: Secondary | ICD-10-CM | POA: Diagnosis not present

## 2021-11-15 DIAGNOSIS — L720 Epidermal cyst: Secondary | ICD-10-CM | POA: Diagnosis not present

## 2021-11-15 DIAGNOSIS — L812 Freckles: Secondary | ICD-10-CM | POA: Diagnosis not present

## 2021-11-22 ENCOUNTER — Ambulatory Visit: Payer: Medicare PPO | Admitting: Internal Medicine

## 2021-11-22 ENCOUNTER — Other Ambulatory Visit: Payer: Self-pay

## 2021-11-22 ENCOUNTER — Encounter: Payer: Self-pay | Admitting: Internal Medicine

## 2021-11-22 VITALS — BP 128/62 | HR 84 | Temp 98.4°F | Ht 64.0 in | Wt 176.4 lb

## 2021-11-22 DIAGNOSIS — R053 Chronic cough: Secondary | ICD-10-CM | POA: Diagnosis not present

## 2021-11-22 DIAGNOSIS — Z23 Encounter for immunization: Secondary | ICD-10-CM | POA: Diagnosis not present

## 2021-11-22 MED ORDER — ESOMEPRAZOLE MAGNESIUM 40 MG PO CPDR: DELAYED_RELEASE_CAPSULE | ORAL | 3 refills | Status: DC

## 2021-11-22 MED ORDER — GABAPENTIN 100 MG PO CAPS: 100.0000 mg | ORAL_CAPSULE | Freq: Three times a day (TID) | ORAL | Status: AC

## 2021-11-22 NOTE — Progress Notes (Signed)
Subjective:     Patient ID: Susan Carpenter, female   DOB: 1946/02/14,   MRN: [id number]     Brief patient profile:  52 yowf  never smoker with "allergies all her life"manifested by ? HA/some runny nose and pos w/u by Dr Harold Hedge around 2014 eval Pos to trees/ pollen/ mold/ dust and shots x 2 years but  Had severe skin reactions to shots so they were stopped with no  Difference in symptoms per pt on vs off the shots  and varies zyrtec vs clariton and occ benadryl to control the symptoms  Then developed  insidious onset chronic cough x around 2016 so referred to pulmonary clinic 12/20/2017 by Dr Drema Dallas   History of Present Illness  12/20/2017 1st [facility] office visit/ Susan Carpenter   Chief Complaint  Patient presents with   Pulmonary Consult    Referred by Dr. Leighton Ruff. Pt c/o cough for 3 yrs. She states it's usually worse in the Winter, but "it's bad in the summer too". She states that the cough starts in the evening and will occ wake her up in the night. She has quite a bit of mucus in the mornings- unsure of color.  She also has occ chest tightness and the urge to clear her throat often.   onset was insidious and pattern variable = it can resolve for a few days at most then recurs and sometimes coughs up as much as a sev tsp esp in am's  Kouffman Reflux v Neurogenic Cough Differentiator Reflux Comments  Do you awaken from a sound sleep coughing violently?                            With trouble breathing? Not violently but sporadic   Do you have choking episodes when you cannot  Get enough air, gasping for air ?              no   Do you usually cough when you lie down into  The bed, or when you just lie down to rest ?                          Sporadic/ uses cough drops   Do you usually cough after meals or eating?         Yes   Do you cough when (or after) you bend over?    Yes   GERD SCORE     Kouffman Reflux v Neurogenic Cough Differentiator Neurogenic   Do you more-or-less  cough all day long? sporadic   Does change of temperature make you cough? no   Does laughing or chuckling cause you to cough? If lots of laugh   Do fumes (perfume, automobile fumes, burned  Toast, etc.,) cause you to cough ?      no   Does speaking, singing, or talking on the phone cause you to cough   ?               No    Neurogenic/Airway score      On nexium 20 mg 30 min before bfast and on multiple high dose oil based supplements  Cough to vomit it past  gen chest discomfort only during coughing fits  On plaquenil for alopecia  Not limited by breathing from desired activities  rec Change nexium to 40 mg Take 30- 60 min before your first and last meals  of the day  For drainage / throat tickle try take CHLORPHENIRAMINE  4 mg - take one every 4 hours as needed - GERD   Please see patient coordinator before you leave today  to schedule sinus ct If not improving >>> Prednisone 10 mg take  4 each am x 2 days,   2 each am x 2 days,  1 each am x 2 days and stop  Please schedule a follow up office visit in 6 weeks, sooner if needed  with all medications /inhalers/ solutions in hand so we can verify exactly what you are taking. This includes all medications from all doctors and over the counters - needs feno on return.    01/30/2018  f/u ov/Susan Carpenter re:  uacs vs cough variant asthma / poor cough control since 2016/ did not bring all meds Chief Complaint  Patient presents with   Follow-up    Cough has improved some. She occ will produce some sputum but she is unsure of color.  She has been having some HA and fatigue that she relates to her allergies.   Dyspnea:  Not limited by sob  Cough: supper time  And after  Sleep: not waking up  SABA use:  None  rec Go ahead and take the prednisone Symbicort 80 Take 2 puffs first thing in am and then another 2 puffs about 12 hours later.  Work on inhaler technique:    Stop the clariton and zyrtec and try  CHLORPHENIRAMINE  4 mg - take one - two every 4  hours as needed - available over the counter- may cause drowsiness so start with just a bedtime dose or two and see how you tolerate it before trying in daytime      02/27/2018  f/u ov/Susan Carpenter re: uacs vs cough variant asthma/ poor control since 2016/brought meds but not clear she ever tried h1 as rec last ov  Chief Complaint  Patient presents with   Follow-up    She tried the symbicort for 1 wk and her cough got worse so she stopped. She states she went to the beach recently and she did not cough as much.   Dyspnea:  Felt fine during the walk x 27 min flat while at the beach    Cough: after supper and at bedtime min mucoid sputum  symbicort made the cough worse  Prednisone may have helped  rec Please remember to go to the lab department downstairs in the basement  for your tests - we will call you with the results when they are available. GERD  Diet  For drainage / throat tickle try take CHLORPHENIRAMINE  4 mg - take one every 4 hours as needed -     MCT neg 03/07/18   04/01/2018  f/u ov/Susan Carpenter re: cough since 2016 worse x 2 weeks - had improved to point where only cough q 3-4 days  Chief Complaint  Patient presents with   Acute Visit    Increased cough since 03/16/18- she feels like sputum comes up but she does not cough it out.   Dyspnea:  Mostly related to cough  Cough: severe but never spits any out ever - always swallow, never able to expectorate Feels fine when wakes up then worse as day goes on  gen chest and upper abd discomfort brought on by coughing fits on rec The key to effective treatment for your cough is eliminating the non-stop cycle of cough you're stuck in long enough to let your airway  heal completely and then see if there is anything still making you cough once you stop the cough suppression, but this should take no more than 5 days to figure out Any time you feel the urge to cough, do so into the flutter valve to prevent airway trauma  First take mucinex dm 1200 every 12  hours and supplement if needed with  tramadol 50 mg up to 2 every 4 hours Prednisone 10 mg take  4 each am x 2 days,   2 each am x 2 days,  1 each am x 2 days and stop (this is to eliminate allergies and inflammation from coughing) GERD diet Calcium should be gluconate, no  carbonate form of calcium  Please schedule a follow up office visit in 6 weeks, call sooner if needed with all active medications in hand including over the counters       05/15/2018  f/u ov/Susan Carpenter re: cough x 2016   Chief Complaint  Patient presents with   Follow-up    Cough has improved some, but still present. She still has some green to brown sputum.    Dyspnea:  MMRC1 = can walk nl pace, flat grade, can't hurry or go uphills or steps s sob   Cough: worse mid morning now >>>  A tsp of thick mucus slt green, levaquin did not help (rx 04/07/18 ) not using mucinex dm or flutter as instructed  Sleeping: on either side with one pillow s noct cough  SABA use: none 02: none  Saw allergy [place]  05/14/18 > neg skin testing x for dust >>   pnds improved with atrovent NS rec For cough   >  mucinex dm 1200 mg every 12 hours and use the flutter valve as much as you can schedule HRCT of chest >  Neg bronchiectasis/  Please schedule a follow up office visit in 4  weeks, call sooner if needed with all medications /inhalers/ solutions in hand    08/04/2018  f/u ov/Susan Carpenter re: cough 2016  /   Throat sensation is still there even on prednisone but cough much better on gabapentin 100 sometimes misses doses Chief Complaint  Patient presents with   Follow-up    She is coughing less. She does clear her throat some.   Dyspnea:  Not limited by breathing from desired activities   Cough: resolved despite not taking gabapentin consistenly at 100 tid Sleeping: fine p 1st gen H1 blockers per guidelines  At hs SABA use: none 02: none   rec Increase gabapentin 100 mg qid    07/04/2020 extended acute extended ov/Susan Carpenter re: recurrent cough since  1st of aug 2021 did not follow full action plan Chief Complaint  Patient presents with   Acute Visit    Bad cough.. tickle in chest, headaches and stuffy nose.   baseline On  nexium 40 mg ac  And resumed the bid immediately  Did not maintain on zyrtec but  with onset tied unsuccessfully x one week then tried 1st gen H1 blockers per guidelines  But never more than one at hs  Was not on gabapentin prior to acute flare and did not have any to take  Never produced any mucus Rec When coughing for any reason  >  Nexium Take 30- 60 min before your first and last meals of the day and add mucinex dm  1200 mg every 1200 mg every 12 hours as needed and cough into flutter valve  If still coughing change mucinex  dm to phenergan dm but don't drive When nose dripping for any reason/  throat tickle try(instead of Zyrtec)  take CHLORPHENIRAMINE  4 mg - take one every 4 hours as needed - available over the counter- may cause drowsiness so start with just a bedtime dose or two and see how you tolerate it before trying in daytime  And also can use atrovent nasal spray as needed Prednisone 10 mg take  4 each am x 2 days,   2 each am x 2 days,  1 each am x 2 days and stop  When cough is not getting better on the above >  then start gabapentin 100 mg up to 4 x daily until cough is gone  GERD  diet    11/22/2021  f/u ov/Susan Carpenter re: cough x 2016    maint on gerd rx / zyrtec each am / supplement with 1st gen H1 blockers per guidelines    Chief Complaint  Patient presents with   Follow-up    Feeling good, still cough-dry.Needs refill Nexium   Dyspnea:  walking dog with hills x 25 min bid  Cough: minimal day > noct - never took gabapentin, still has globus sensation of cough on inspiration Sleeping: ok on side/ bed flat  SABA use: none 02: none Covid status:   vax x 3 total    No obvious day to day or daytime variability or assoc excess/ purulent sputum or mucus plugs or hemoptysis or cp or chest tightness,  subjective wheeze or overt sinus or hb symptoms.   Sleeping  without nocturnal  or early am exacerbation  of respiratory  c/o's or need for noct saba. Also denies any obvious fluctuation of symptoms with weather or environmental changes or other aggravating or alleviating factors except as outlined above   No unusual exposure hx or h/o childhood pna/ asthma or knowledge of premature birth.  Current Allergies, Complete Past Medical History, Past Surgical History, Family History, and Social History were reviewed in [employer] record.  ROS  The following are not active complaints unless bolded Hoarseness, sore throat, dysphagia, dental problems, itching, sneezing,  nasal congestion or discharge of excess mucus or purulent secretions, ear ache,   fever, chills, sweats, unintended wt loss or wt gain, classically pleuritic or exertional cp,  orthopnea pnd or arm/hand swelling  or leg swelling, presyncope, palpitations, abdominal pain, anorexia, nausea, vomiting, diarrhea  or change in bowel habits or change in bladder habits, change in stools or change in urine, dysuria, hematuria,  rash, arthralgias, visual complaints, headache, numbness, weakness or ataxia or problems with walking or coordination,  change in mood or  memory.        Current Meds  Medication Sig   betamethasone dipropionate 0.05 % lotion betamethasone dipropionate 0.05 % lotion  APPLY TO FRONTAL HAIRLINE TOP OF SCALP AND AREAS OF ITCHING ONCE A DAY   Biotin 5000 MCG TABS Take by mouth daily.   Calcium-Magnesium-Vitamin D (CALCIUM 500 PO) Take by mouth 2 (two) times daily.   cetirizine (ZYRTEC) 10 MG tablet Take 10 mg by mouth as needed for allergies.   dutasteride (AVODART) 0.5 MG capsule Take 0.5 mg by mouth daily.   EPIPEN 2-PAK 0.3 MG/0.3ML SOAJ injection use as directed for INJECTION   esomeprazole (NEXIUM) 40 MG capsule TAKE 1 CAPSULE BY MOUTH TWICE DAILY TAKE  30-60  MIN  BEFORE  YOUR  FIRST  AND  LAST   MEALS  OF  THE  DAY  hydroxychloroquine (PLAQUENIL) 200 MG tablet Take 200 mg by mouth daily.   Magnesium 400 MG CAPS Take by mouth daily.   methocarbamol (ROBAXIN) 500 MG tablet TAKE 1 TABLET BY MOUTH TWICE DAILY AS NEEDED FOR MUSCLE SPASM   MINOXIDIL PO Take by mouth daily.   naproxen sodium (ANAPROX) 220 MG tablet Take by mouth as needed.    Omega-3 Fatty Acids (FISH OIL) 1000 MG CAPS Take 1,000 mg by mouth daily.   Probiotic Product (PROBIOTIC PO) Take by mouth daily.   sertraline (ZOLOFT) 50 MG tablet Take 75 mg by mouth daily.    TURMERIC PO Herbal Name: Tumeric   VITAMIN D PO Take 1 tablet by mouth daily.                        Objective:   Physical Exam   wts     11/22/2021        176       08/18/2019     196  01/27/2019        193  11/06/2018          190  08/04/2018        191  06/23/2018        188  05/15/2018        191  04/01/2018        187  02/27/2018        190  01/30/2018        193   12/20/17 199 lb 3.2 oz (90.4 kg)  04/17/17 201 lb (91.2 kg)      Vital signs reviewed  11/22/2021  - Note at rest 02 sats  96% on RA   General appearance:    amb wf nad   HEENT : pt wearing mask not removed for exam due to covid -19 concerns.    NECK :  without JVD/Nodes/TM/ nl carotid upstrokes bilaterally   LUNGS: no acc muscle use,  Nl contour chest which is clear to A and P bilaterally with cough early on insp   maneuvers   CV:  RRR  no s3 or murmur or increase in P2, and no edema   ABD:  soft and nontender with nl inspiratory excursion in the supine position. No bruits or organomegaly appreciated, bowel sounds nl  MS:  Nl gait/ ext warm without deformities, calf tenderness, cyanosis or clubbing No obvious joint restrictions   SKIN: warm and dry without lesions    NEURO:  alert, approp, nl sensorium with  no motor or cerebellar deficits apparent.              Assessment:

## 2021-11-22 NOTE — Patient Instructions (Addendum)
You should eat supper 4 hours before bedtime and follow the contingencies as per previous instructions   Standard flu shot is fine  Ok to try gabapentin 100 mg twice build up gradually  to a maximum of 1200 mg daily and let me know what dose you need   Try  Nexium 40 mg   Take  30-60 min before first meal of the day and Pepcid (famotidine)  20 mg after supper until return to office   Please schedule a follow up visit in 12  months but call sooner if needed

## 2021-11-22 NOTE — Assessment & Plan Note (Signed)
Onset 2016   Irena Cords eval around 2014 eval Pos to trees/ pollen/ mold/ dust and shots x 2 years but  Had severe skin reactions to shots so they were stopped with no  Difference in symptoms  Singulair failed 2017-18 Spirometry 12/20/2017  wnl x for effort dep portion of f/v loop  - max gerd rx plus 1st gen H1 blockers per guidelines  12/20/2017 >>>did not use latter - CT sinus 01/01/2018  Ok  - FENO 01/30/2018  =   13  - 01/30/2018  After extensive coaching inhaler device  effectiveness =    75% try symbicort 80 2bid > cough so stopped - Allergy profile 02/27/2018 >  Eos 0.3 /  IgE 74  RAST Dust > ragweed  - 03/07/18 MCT neg  - Cylcical cough rx 04/01/2018  Saw allergy again 05/14/18 > neg skin testing x for dust >>   pnds improved with atrovent NS - HRCT 05/28/2018 >>>   Neg ILD/ mod HH  - 06/23/2018 trial of gabapentin 100 tid improved but still globus sensation so rec 100 qid > better and wanted trial off 11/06/2018 but rec max rx with 1st gen H1 blockers per guidelines  And then  try wean to  Off> restarted around 1st of march 2020 for "allergies" with pnds / did not try 1st gen H1 blockers per guidelines  Or atrovent  - 01/27/2019 retrained on purse lip breathing for "wheeze" and flutter valve in setting of "wheezing flare" - 07/04/2020 restarted gabapentin 300 mg tid as action plan for recurrence> did not do   -11/22/2021 rec titrate up gabapentin to max of 300 mg qid and change gerd rx to ppi ac and h2 p supper  Improved but not resolved   Discussed the recent press about ppi's in the context of a statistically significant (but questionably clinically relevant) increase in CRI in pts on ppi vs h2's > bottom line is the lowest dose of ppi that controls   gerd is the right dose and if that dose is zero that's fine esp since h2's are cheaper.   See instructions above   f/u q 12 m, call sooner prn    Each maintenance medication was reviewed in detail including emphasizing most importantly the  difference between maintenance and prns and under what circumstances the prns are to be triggered using an action plan format where appropriate.  Total time for H and P, chart review, counseling  and generating customized AVS unique to this office visit / same day charting = 33 min

## 2021-12-26 DIAGNOSIS — Z79899 Other long term (current) drug therapy: Secondary | ICD-10-CM | POA: Diagnosis not present

## 2021-12-26 DIAGNOSIS — L661 Lichen planopilaris: Secondary | ICD-10-CM | POA: Diagnosis not present

## 2022-01-17 DIAGNOSIS — Z1231 Encounter for screening mammogram for malignant neoplasm of breast: Secondary | ICD-10-CM | POA: Diagnosis not present

## 2022-01-23 DIAGNOSIS — J3089 Other allergic rhinitis: Secondary | ICD-10-CM | POA: Diagnosis not present

## 2022-01-23 DIAGNOSIS — H1045 Other chronic allergic conjunctivitis: Secondary | ICD-10-CM | POA: Diagnosis not present

## 2022-01-23 DIAGNOSIS — J301 Allergic rhinitis due to pollen: Secondary | ICD-10-CM | POA: Diagnosis not present

## 2022-01-23 DIAGNOSIS — J300 Vasomotor rhinitis: Secondary | ICD-10-CM | POA: Diagnosis not present

## 2022-01-29 DIAGNOSIS — H04123 Dry eye syndrome of bilateral lacrimal glands: Secondary | ICD-10-CM | POA: Diagnosis not present

## 2022-01-29 DIAGNOSIS — H10413 Chronic giant papillary conjunctivitis, bilateral: Secondary | ICD-10-CM | POA: Diagnosis not present

## 2022-02-21 DIAGNOSIS — E7800 Pure hypercholesterolemia, unspecified: Secondary | ICD-10-CM | POA: Diagnosis not present

## 2022-02-21 DIAGNOSIS — M797 Fibromyalgia: Secondary | ICD-10-CM | POA: Diagnosis not present

## 2022-02-21 DIAGNOSIS — F3341 Major depressive disorder, recurrent, in partial remission: Secondary | ICD-10-CM | POA: Diagnosis not present

## 2022-02-21 DIAGNOSIS — Z1211 Encounter for screening for malignant neoplasm of colon: Secondary | ICD-10-CM | POA: Diagnosis not present

## 2022-02-21 DIAGNOSIS — Z23 Encounter for immunization: Secondary | ICD-10-CM | POA: Diagnosis not present

## 2022-02-21 DIAGNOSIS — Z0000 Encounter for general adult medical examination without abnormal findings: Secondary | ICD-10-CM | POA: Diagnosis not present

## 2022-02-21 DIAGNOSIS — M3500 Sicca syndrome, unspecified: Secondary | ICD-10-CM | POA: Diagnosis not present

## 2022-03-02 DIAGNOSIS — H04123 Dry eye syndrome of bilateral lacrimal glands: Secondary | ICD-10-CM | POA: Diagnosis not present

## 2022-03-07 DIAGNOSIS — L661 Lichen planopilaris: Secondary | ICD-10-CM | POA: Diagnosis not present

## 2022-03-07 DIAGNOSIS — Z79899 Other long term (current) drug therapy: Secondary | ICD-10-CM | POA: Diagnosis not present

## 2022-03-16 DIAGNOSIS — R051 Acute cough: Secondary | ICD-10-CM | POA: Diagnosis not present

## 2022-03-16 DIAGNOSIS — I959 Hypotension, unspecified: Secondary | ICD-10-CM | POA: Diagnosis not present

## 2022-03-16 DIAGNOSIS — U071 COVID-19: Secondary | ICD-10-CM | POA: Diagnosis not present

## 2022-03-16 DIAGNOSIS — R5383 Other fatigue: Secondary | ICD-10-CM | POA: Diagnosis not present

## 2022-03-16 DIAGNOSIS — R509 Fever, unspecified: Secondary | ICD-10-CM | POA: Diagnosis not present

## 2022-03-26 DIAGNOSIS — Z1212 Encounter for screening for malignant neoplasm of rectum: Secondary | ICD-10-CM | POA: Diagnosis not present

## 2022-03-26 DIAGNOSIS — Z1211 Encounter for screening for malignant neoplasm of colon: Secondary | ICD-10-CM | POA: Diagnosis not present

## 2022-10-08 ENCOUNTER — Other Ambulatory Visit: Payer: Self-pay | Admitting: Rheumatology

## 2022-10-11 DIAGNOSIS — M170 Bilateral primary osteoarthritis of knee: Secondary | ICD-10-CM | POA: Diagnosis not present

## 2022-10-11 DIAGNOSIS — M25562 Pain in left knee: Secondary | ICD-10-CM | POA: Diagnosis not present

## 2022-10-15 DIAGNOSIS — Z79899 Other long term (current) drug therapy: Secondary | ICD-10-CM | POA: Diagnosis not present

## 2022-10-15 DIAGNOSIS — L661 Lichen planopilaris: Secondary | ICD-10-CM | POA: Diagnosis not present

## 2022-11-08 ENCOUNTER — Telehealth: Payer: Self-pay | Admitting: Internal Medicine

## 2022-11-08 NOTE — Telephone Encounter (Signed)
Called and spoke to patient and told her that Dr Melvyn Novas does recommend the RSV vaccine to patients. But she should get it as long as she is not sick or running a fever. And to update [place] with the date that she receives the vaccine if she chooses to get it. She voiced understanding. Nothing further needed.

## 2022-11-12 ENCOUNTER — Encounter: Payer: Self-pay | Admitting: Internal Medicine

## 2022-11-12 ENCOUNTER — Ambulatory Visit (INDEPENDENT_AMBULATORY_CARE_PROVIDER_SITE_OTHER): Payer: Medicare PPO

## 2022-11-12 ENCOUNTER — Telehealth: Payer: Self-pay | Admitting: Internal Medicine

## 2022-11-12 ENCOUNTER — Ambulatory Visit: Payer: Medicare PPO | Admitting: Internal Medicine

## 2022-11-12 VITALS — BP 138/70 | HR 83 | Temp 98.2°F | Ht 64.0 in | Wt 170.4 lb

## 2022-11-12 DIAGNOSIS — K449 Diaphragmatic hernia without obstruction or gangrene: Secondary | ICD-10-CM | POA: Diagnosis not present

## 2022-11-12 DIAGNOSIS — R053 Chronic cough: Secondary | ICD-10-CM

## 2022-11-12 MED ORDER — METHYLPREDNISOLONE ACETATE 80 MG/ML IJ SUSP
120.0000 mg | Freq: Once | INTRAMUSCULAR | Status: AC
  Administered 2022-11-12: 120 mg via INTRAMUSCULAR

## 2022-11-12 NOTE — Patient Instructions (Addendum)
For drainage / throat tickle try take CHLORPHENIRAMINE  4 mg  ("Allergy Relief" 4mg   at [facility] should be easiest to find in the blue box usually on bottom shelf)  take one every 4 hours as needed - extremely effective and inexpensive over the counter- may cause drowsiness so start with just a dose or two an hour before bedtime and see how you tolerate it before trying in daytime.   Best cough medication = delsym 2 tsp ( or mucinex dm 1200 mg)  every 12 hours as needed   Stop clariton / zyrtec while on chlorpheniramine   Start back up on pepcid 20 mg after supper or with your pm chlorpheniramine  GERD (REFLUX)  is an extremely common cause of respiratory symptoms just like yours , many times with no obvious heartburn at all.    It can be treated with medication, but also with lifestyle changes including elevation of the head of your bed (ideally with 6 -8inch blocks under the headboard of your bed),  Smoking cessation, avoidance of late meals, excessive alcohol, and avoid fatty foods, chocolate, peppermint, colas, red wine, and acidic juices such as orange juice.  NO MINT OR MENTHOL PRODUCTS SO NO COUGH DROPS  USE SUGARLESS CANDY INSTEAD (Jolley ranchers or Stover's or Life Savers) or even ice chips will also do - the key is to swallow to prevent all throat clearing. NO OIL BASED VITAMINS - use powdered substitutes.  Avoid fish oil when coughing.   Depomedrol 120 mg IM   Please remember to go to the  x-ray department  for your tests - we will call you with the results when they are available     If condition worsens go to ER

## 2022-11-12 NOTE — Progress Notes (Unsigned)
Subjective:     Patient ID: Susan Carpenter, female   DOB: 06-Aug-1946,   MRN: [id number]     Brief patient profile:  28 yowf  never smoker with "allergies all her life"manifested by ? HA/some runny nose and pos w/u by Dr Harold Hedge around 2014 eval Pos to trees/ pollen/ mold/ dust and shots x 2 years but  Had severe skin reactions to shots so they were stopped with no  Difference in symptoms per pt on vs off the shots  and varies zyrtec vs clariton and occ benadryl to control the symptoms  Then developed  insidious onset chronic cough x around 2016 so referred to pulmonary clinic 12/20/2017 by Dr Drema Dallas   History of Present Illness  12/20/2017 1st [facility] office visit/ Braxon Suder   Chief Complaint  Patient presents with   Pulmonary Consult    Referred by Dr. Leighton Ruff. Pt c/o cough for 3 yrs. She states it's usually worse in the Winter, but "it's bad in the summer too". She states that the cough starts in the evening and will occ wake her up in the night. She has quite a bit of mucus in the mornings- unsure of color.  She also has occ chest tightness and the urge to clear her throat often.   onset was insidious and pattern variable = it can resolve for a few days at most then recurs and sometimes coughs up as much as a sev tsp esp in am's  Kouffman Reflux v Neurogenic Cough Differentiator Reflux Comments  Do you awaken from a sound sleep coughing violently?                            With trouble breathing? Not violently but sporadic   Do you have choking episodes when you cannot  Get enough air, gasping for air ?              no   Do you usually cough when you lie down into  The bed, or when you just lie down to rest ?                          Sporadic/ uses cough drops   Do you usually cough after meals or eating?         Yes   Do you cough when (or after) you bend over?    Yes   GERD SCORE     Kouffman Reflux v Neurogenic Cough Differentiator Neurogenic   Do you more-or-less  cough all day long? sporadic   Does change of temperature make you cough? no   Does laughing or chuckling cause you to cough? If lots of laugh   Do fumes (perfume, automobile fumes, burned  Toast, etc.,) cause you to cough ?      no   Does speaking, singing, or talking on the phone cause you to cough   ?               No    Neurogenic/Airway score      On nexium 20 mg 30 min before bfast and on multiple high dose oil based supplements  Cough to vomit it past  gen chest discomfort only during coughing fits  On plaquenil for alopecia  Not limited by breathing from desired activities  rec Change nexium to 40 mg Take 30- 60 min before your first and last meals  of the day  For drainage / throat tickle try take CHLORPHENIRAMINE  4 mg - take one every 4 hours as needed - GERD   Please see patient coordinator before you leave today  to schedule sinus ct If not improving >>> Prednisone 10 mg take  4 each am x 2 days,   2 each am x 2 days,  1 each am x 2 days and stop  Please schedule a follow up office visit in 6 weeks, sooner if needed  with all medications /inhalers/ solutions in hand so we can verify exactly what you are taking. This includes all medications from all doctors and over the counters - needs feno on return.   MCT neg 03/07/18   04/01/2018  f/u ov/Hristopher Missildine re: cough since 2016 worse x 2 weeks - had improved to point where only cough q 3-4 days  Chief Complaint  Patient presents with   Acute Visit    Increased cough since 03/16/18- she feels like sputum comes up but she does not cough it out.   Dyspnea:  Mostly related to cough  Cough: severe but never spits any out ever - always swallow, never able to expectorate Feels fine when wakes up then worse as day goes on  gen chest and upper abd discomfort brought on by coughing fits on rec The key to effective treatment for your cough is eliminating the non-stop cycle of cough you're stuck in long enough to let your airway heal completely  and then see if there is anything still making you cough once you stop the cough suppression, but this should take no more than 5 days to figure out Any time you feel the urge to cough, do so into the flutter valve to prevent airway trauma  First take mucinex dm 1200 every 12 hours and supplement if needed with  tramadol 50 mg up to 2 every 4 hours Prednisone 10 mg take  4 each am x 2 days,   2 each am x 2 days,  1 each am x 2 days and stop (this is to eliminate allergies and inflammation from coughing) GERD diet Calcium should be gluconate, no  carbonate form of calcium  Please schedule a follow up office visit in 6 weeks, call sooner if needed with all active medications in hand including over the counters       05/15/2018  f/u ov/Belisa Eichholz re: cough x 2016   Chief Complaint  Patient presents with   Follow-up    Cough has improved some, but still present. She still has some green to brown sputum.    Dyspnea:  MMRC1 = can walk nl pace, flat grade, can't hurry or go uphills or steps s sob   Cough: worse mid morning now >>>  A tsp of thick mucus slt green, levaquin did not help (rx 04/07/18 ) not using mucinex dm or flutter as instructed  Sleeping: on either side with one pillow s noct cough  SABA use: none 02: none  Saw allergy [place]  05/14/18 > neg skin testing x for dust >>   pnds improved with atrovent NS rec For cough   >  mucinex dm 1200 mg every 12 hours and use the flutter valve as much as you can schedule HRCT of chest >  Neg bronchiectasis/  Please schedule a follow up office visit in 4  weeks, call sooner if needed with all medications /inhalers/ solutions in hand    11/22/2021  f/u ov/Saramarie Stinger re: cough x  2016    maint on gerd rx / zyrtec each am / supplement with 1st gen H1 blockers per guidelines    Chief Complaint  Patient presents with   Follow-up    Feeling good, still cough-dry.Needs refill Nexium   Dyspnea:  walking dog with hills x 25 min bid  Cough: minimal day > noct -  never took gabapentin, still has globus sensation of cough on inspiration Sleeping: ok on side/ bed flat  SABA use: none 02: none Covid status:   vax x 3 total  Rec You should eat supper 4 hours before bedtime and follow the contingencies as per previous instructions  Ok to try gabapentin 100 mg twice build up gradually  to a maximum of 1200 mg daily and let me know what dose you need Try  Nexium 40 mg   Take  30-60 min before first meal of the day and Pepcid (famotidine)  20 mg after supper until return to office   Please schedule a follow up visit in 12  months but call sooner if needed     11/12/2022 Acute  ov/Naesha Buckalew re: recurrent  cough / stopped gabapentin on her own    maint on nexium 40 mg ac but not pepcid in pm / f/b [place] and says fine until early December  2023 ? P trip to attic to pull out xmas / same cp for years when coughs her way into cp described as diffuse burning and only occurs with cough  Chief Complaint  Patient presents with   Acute Visit    Cough, PND, clearing throat, fatigue, chest pain began yesterday, 11/11/2022.  Dyspnea:  comfortable unless severe cough to point of almost choking  Cough: dry then ? Caught husband's cold > he was seen in UC rec  doxy / pred but not tested  Sleeping: worse last night with sensation of PND  SABA use: none  02: none    No obvious day to day or daytime variability or assoc excess/ purulent sputum or mucus plugs or hemoptysis or cp or chest tightness, subjective wheeze or overt sinus or hb symptoms.     Also denies any obvious fluctuation of symptoms with weather or environmental changes or other aggravating or alleviating factors except as outlined above   No unusual exposure hx or h/o childhood pna/ asthma or knowledge of premature birth.  Current Allergies, Complete Past Medical History, Past Surgical History, Family History, and Social History were reviewed in [employer] record.  ROS  The following  are not active complaints unless bolded Hoarseness, sore throat/globus , dysphagia, dental problems, itching, sneezing,  nasal congestion or discharge of excess mucus or purulent secretions, ear ache,   fever, chills, sweats, unintended wt loss or wt gain, classically pleuritic or exertional cp,  orthopnea pnd or arm/hand swelling  or leg swelling, presyncope, palpitations, abdominal pain, anorexia, nausea, vomiting, diarrhea  or change in bowel habits or change in bladder habits, change in stools or change in urine, dysuria, hematuria,  rash, arthralgias, visual complaints, headache, numbness, weakness or ataxia or problems with walking or coordination,  change in mood or  memory.        Current Meds  Medication Sig   betamethasone dipropionate 0.05 % lotion betamethasone dipropionate 0.05 % lotion  APPLY TO FRONTAL HAIRLINE TOP OF SCALP AND AREAS OF ITCHING ONCE A DAY   Biotin 5000 MCG TABS Take by mouth daily.   Calcium-Magnesium-Vitamin D (CALCIUM 500 PO) Take by mouth 2 (  two) times daily.   cetirizine (ZYRTEC) 10 MG tablet Take 10 mg by mouth as needed for allergies.   dutasteride (AVODART) 0.5 MG capsule Take 0.5 mg by mouth daily.   EPIPEN 2-PAK 0.3 MG/0.3ML SOAJ injection use as directed for INJECTION   esomeprazole (NEXIUM) 40 MG capsule TAKE 1 CAPSULE BY MOUTH TWICE DAILY TAKE  30-60  MIN  BEFORE  YOUR  FIRST  AND  LAST  MEALS  OF  THE  DAY   gabapentin (NEURONTIN) 100 MG capsule Stopped on her own   hydroxychloroquine (PLAQUENIL) 200 MG tablet Take 200 mg by mouth daily.   Magnesium 400 MG CAPS Take by mouth daily.   methocarbamol (ROBAXIN) 500 MG tablet TAKE 1 TABLET BY MOUTH TWICE DAILY AS NEEDED FOR MUSCLE SPASM   MINOXIDIL PO Take by mouth daily.   naproxen sodium (ANAPROX) 220 MG tablet Take by mouth as needed.    Omega-3 Fatty Acids (FISH OIL) 1000 MG CAPS Take 1,000 mg by mouth daily.   Probiotic Product (PROBIOTIC PO) Take by mouth daily.   sertraline (ZOLOFT) 50 MG tablet  Take 75 mg by mouth daily.    TURMERIC PO Herbal Name: Tumeric   VITAMIN D PO Take 1 tablet by mouth daily.           Objective:   Physical Exam   wts     11/12/2022          170  11/22/2021        176       08/18/2019     196  01/27/2019        193  11/06/2018          190  08/04/2018        191  06/23/2018        188  05/15/2018        191  04/01/2018        187  02/27/2018        190  01/30/2018        193   12/20/17 199 lb 3.2 oz (90.4 kg)  04/17/17 201 lb (91.2 kg)     Vital signs reviewed  11/12/2022  - Note at rest 02 sats  RA% on RA   General appearance:    amb wf harsh dry honking cough "something stuck in my throat   HEENT : Oropharynx  clear/no pnds or cobblestoning         NECK :  without  apparent JVD/ palpable Nodes/TM    LUNGS: no acc muscle use,  Nl contour chest which is clear to A and P bilaterally without cough on insp or exp maneuvers   CV:  RRR  no s3 or murmur or increase in P2, and no edema   ABD:  soft and nontender with nl inspiratory excursion in the supine position. No bruits or organomegaly appreciated   MS:  Nl gait/ ext warm without deformities Or obvious joint restrictions  calf tenderness, cyanosis or clubbing    SKIN: warm and dry without lesions    NEURO:  alert, approp, nl sensorium with  no motor or cerebellar deficits apparent.         CXR PA and Lateral:   11/12/2022 :    I personally reviewed images and impression is as follows:     No acute changes       Assessment:

## 2022-11-13 NOTE — Assessment & Plan Note (Addendum)
Onset 2016   Susan Carpenter eval around 2014 eval Pos to trees/ pollen/ mold/ dust and shots x 2 years but  Had severe skin reactions to shots so they were stopped with no  Difference in symptoms  Singulair failed 2017-18 Spirometry 12/20/2017  wnl x for effort dep portion of f/v loop  - max gerd rx plus 1st gen H1 blockers per guidelines  12/20/2017 >>>did not use latter - CT sinus 01/01/2018  Ok  - FENO 01/30/2018  =   13  - 01/30/2018  After extensive coaching inhaler device  effectiveness =    75% try symbicort 80 2bid > cough so stopped - Allergy profile 02/27/2018 >  Eos 0.3 /  IgE 74  RAST Dust > ragweed  - 03/07/18 MCT neg  - Cylcical cough rx 04/01/2018  Saw allergy again 05/14/18 > neg skin testing x for dust >>   pnds improved with atrovent NS - HRCT 05/28/2018 >>>   Neg ILD/ mod HH  - 06/23/2018 trial of gabapentin 100 tid improved but still globus sensation so rec 100 qid > better and wanted trial off 11/06/2018 but rec max rx with 1st gen H1 blockers per guidelines  And then  try wean to  Off> restarted around 1st of march 2020 for "allergies" with pnds / did not try 1st gen H1 blockers per guidelines  Or atrovent  - 01/27/2019 retrained on purse lip breathing for "wheeze" and flutter valve in setting of "wheezing flare" - 07/04/2020 restarted gabapentin 300 mg tid as action plan for recurrence> did not do -11/22/2021 rec titrate up gabapentin to max of 300 mg qid and change gerd rx to ppi ac and h2 p supper> took herself off gabapentin an flared 1st week in Dec 2023 p exp to attic dust   Of the three most common causes of  Sub-acute / recurrent or chronic cough, only one (GERD)  can actually contribute to/ trigger  the other two (asthma and post nasal drip syndrome)  and perpetuate the cylce of cough.  While not intuitively obvious, many patients with chronic low grade reflux do not cough until there is a primary insult that disturbs the protective epithelial barrier and exposes sensitive nerve  endings.   This is typically viral but can due to PNDS and  either may apply here.    >>> The point is that once this occurs, it is difficult to eliminate the cycle  using anything but a maximally effective acid suppression regimen at least in the short run, accompanied by an appropriate diet to address non acid GERD and control / eliminate pnds with 1st gen H1 blockers per guidelines  and >>> also  depomedrol 120 mg IM in case of component of Th-2 driven upper or lower airways inflammation (if cough responds short term only to relapse before return while will on full rx for uacs (as above), then  that would point to allergic rhinitis/ asthma or eos bronchitis as alternative dx) and consider restarting gabapentin titrating as high as 300 mg qid or the lowest effective dose, whichever comes first.  F/u  with allergy planned / here prn          Each maintenance medication was reviewed in detail including emphasizing most importantly the difference between maintenance and prns and under what circumstances the prns are to be triggered using an action plan format where appropriate.  Total time for H and P, chart review, counseling,  and generating customized AVS unique to this  office visit / same day charting > 30 min for acute eval of recurrent/  refractory respiratory  symptoms of uncertain etiology

## 2022-11-22 DIAGNOSIS — M25562 Pain in left knee: Secondary | ICD-10-CM | POA: Diagnosis not present

## 2022-11-22 DIAGNOSIS — M25561 Pain in right knee: Secondary | ICD-10-CM | POA: Diagnosis not present

## 2022-11-22 DIAGNOSIS — M1712 Unilateral primary osteoarthritis, left knee: Secondary | ICD-10-CM | POA: Diagnosis not present

## 2022-11-28 DIAGNOSIS — Z79899 Other long term (current) drug therapy: Secondary | ICD-10-CM | POA: Diagnosis not present

## 2022-11-29 DIAGNOSIS — M1712 Unilateral primary osteoarthritis, left knee: Secondary | ICD-10-CM | POA: Diagnosis not present

## 2022-11-29 DIAGNOSIS — M25562 Pain in left knee: Secondary | ICD-10-CM | POA: Diagnosis not present

## 2022-12-03 DIAGNOSIS — Z79899 Other long term (current) drug therapy: Secondary | ICD-10-CM | POA: Diagnosis not present

## 2022-12-03 DIAGNOSIS — H04212 Epiphora due to excess lacrimation, left lacrimal gland: Secondary | ICD-10-CM | POA: Diagnosis not present

## 2022-12-06 DIAGNOSIS — M1712 Unilateral primary osteoarthritis, left knee: Secondary | ICD-10-CM | POA: Diagnosis not present

## 2023-01-17 DIAGNOSIS — M1712 Unilateral primary osteoarthritis, left knee: Secondary | ICD-10-CM | POA: Diagnosis not present

## 2023-01-17 DIAGNOSIS — Z96651 Presence of right artificial knee joint: Secondary | ICD-10-CM | POA: Diagnosis not present

## 2023-01-30 DIAGNOSIS — Z1231 Encounter for screening mammogram for malignant neoplasm of breast: Secondary | ICD-10-CM | POA: Diagnosis not present

## 2023-02-07 DIAGNOSIS — M3500 Sicca syndrome, unspecified: Secondary | ICD-10-CM | POA: Diagnosis not present

## 2023-02-07 DIAGNOSIS — Z6830 Body mass index (BMI) 30.0-30.9, adult: Secondary | ICD-10-CM | POA: Diagnosis not present

## 2023-02-07 DIAGNOSIS — Z881 Allergy status to other antibiotic agents status: Secondary | ICD-10-CM | POA: Diagnosis not present

## 2023-02-07 DIAGNOSIS — F3341 Major depressive disorder, recurrent, in partial remission: Secondary | ICD-10-CM | POA: Diagnosis not present

## 2023-02-07 DIAGNOSIS — K08409 Partial loss of teeth, unspecified cause, unspecified class: Secondary | ICD-10-CM | POA: Diagnosis not present

## 2023-02-19 DIAGNOSIS — J3089 Other allergic rhinitis: Secondary | ICD-10-CM | POA: Diagnosis not present

## 2023-02-19 DIAGNOSIS — J300 Vasomotor rhinitis: Secondary | ICD-10-CM | POA: Diagnosis not present

## 2023-02-19 DIAGNOSIS — H1045 Other chronic allergic conjunctivitis: Secondary | ICD-10-CM | POA: Diagnosis not present

## 2023-02-19 DIAGNOSIS — J301 Allergic rhinitis due to pollen: Secondary | ICD-10-CM | POA: Diagnosis not present

## 2023-02-20 NOTE — Telephone Encounter (Signed)
Seems like encounter was open in error so closing encounter.  

## 2023-03-01 DIAGNOSIS — L661 Lichen planopilaris: Secondary | ICD-10-CM | POA: Diagnosis not present

## 2023-03-01 DIAGNOSIS — Z79899 Other long term (current) drug therapy: Secondary | ICD-10-CM | POA: Diagnosis not present

## 2023-03-22 NOTE — Progress Notes (Unsigned)
Office Visit Note  Patient: Susan Carpenter             Date of Birth: 13-Jun-1946           MRN: [id number]             PCP: Juluis Rainier, MD (Inactive) Referring: No ref. provider found Visit Date: 03/25/2023 Occupation: @GUAROCC @  Subjective:  Trochanteric bursitis   History of Present Illness: Susan Carpenter is a 77 y.o. female with history of sjogren's syndrome.   Patient was last seen in the office on 07/18/21.  She reports that she has been under the care of dermatology for management of Sjogren's syndrome and alopecia.  Her dermatologist has been prescribing plaquenil 200 mg 1 tablet by mouth daily and methotrexate 3 tablets by mouth once weekly.  She is tolerating combination therapy and has not missed any doses recently.  She has been seeing the dentist every 6 months and the eye doctor at least every 6 months for routine follow-up.  She continues to have arthralgias and joint stiffness due to underlying osteoarthritis.  Patient presents today with increased pain on the lateral aspect of both hips extending down to both knees.  She is also been having some increased discomfort in the right knee which is replaced.  She had bilateral trochanter bursa cortisone injections performed in the past which have provided relief but her symptoms have returned.  She continues to try to walk at least 10,000-12,000 steps per day for exercise.  She has been wearing a brace on her right knee replacement while walking for support.  She has been having pain at night when lying on her sides due to trochanteric bursitis.  She also has tenderness extending along the IT band bilaterally.  Patient requested a refill of methocarbamol to be sent to the pharmacy which she has taken in the past to alleviate her symptoms.     Activities of Daily Living:  Patient reports morning stiffness for a few minutes.   Patient Reports nocturnal pain.  Difficulty dressing/grooming: Denies Difficulty climbing stairs:  Denies Difficulty getting out of chair: Denies Difficulty using hands for taps, buttons, cutlery, and/or writing: Denies  Review of Systems  Constitutional:  Positive for fatigue.  HENT:  Positive for mouth dryness. Negative for mouth sores.   Eyes:  Positive for dryness.  Respiratory:  Negative for shortness of breath.   Cardiovascular:  Negative for chest pain and palpitations.  Gastrointestinal:  Negative for blood in stool, constipation and diarrhea.  Endocrine: Negative for increased urination.  Genitourinary:  Negative for involuntary urination.  Musculoskeletal:  Positive for joint pain, joint pain, myalgias, morning stiffness, muscle tenderness and myalgias. Negative for gait problem, joint swelling and muscle weakness.  Skin:  Positive for hair loss and sensitivity to sunlight. Negative for color change and rash.  Allergic/Immunologic: Negative for susceptible to infections.  Neurological:  Positive for headaches. Negative for dizziness.  Hematological:  Negative for swollen glands.  Psychiatric/Behavioral:  Positive for sleep disturbance. Negative for depressed mood. The patient is not nervous/anxious.     PMFS History:  Patient Active Problem List   Diagnosis Date Noted   Status post left rotator cuff repair 10/01/2018   History of total knee replacement, right 10/01/2018   DDD (degenerative disc disease), cervical 10/01/2018   DDD (degenerative disc disease), lumbar 10/01/2018   Cough variant asthma vs uacs 02/02/2018   Chronic cough 12/20/2017   Other insomnia 01/15/2017   Other fatigue 01/15/2017  Trapezius muscle spasm 01/15/2017   Alopecia, scarring 09/21/2016   Primary osteoarthritis of both hands 09/19/2016   OA (osteoarthritis) of knee 08/01/2015   INTERSTITIAL CYSTITIS 01/31/2010   Fibromyalgia 01/31/2010   FATIGUE 01/31/2010   DYSPNEA/SHORTNESS OF BREATH 01/31/2010   FREQUENCY, URINARY 01/31/2010   Depression 01/24/2009   Migraines 09/04/2008    HELICOBACTER PYLORI INFECTION 10/14/2007   Allergic rhinitis 10/14/2007   HIATAL HERNIA 10/14/2007   ANA POSITIVE 10/14/2007   Hypothyroidism 10/10/2007   PAIN IN JOINT, SITE UNSPECIFIED 10/10/2007   WEIGHT GAIN 10/10/2007    Past Medical History:  Diagnosis Date   Allergy    rhinitis   ANA positive    Arthritis    Complication of anesthesia    Constipation    Fibromyalgia    Frontal fibrosing alopecia    Dr. Corky Downs    GERD (gastroesophageal reflux disease)    Heart murmur    Helicobacter pylori (H. pylori) infection    s/p treatment   Hiatal hernia    Interstitial cystitis    Pain in joint    site unspecified   PONV (postoperative nausea and vomiting)    Psoriasis    Psoriatic arthritis (HCC)     Family History  Problem Relation Age of Onset   Diabetes Father    Heart disease Father        MI   Alcohol abuse Father    Cancer Mother        COLON    Mental illness Mother        ALZHEIMERS; DEPRESSION   Thyroid disease Mother        HYPOTHYROID   Alcohol abuse Maternal Uncle    Diabetes Maternal Uncle    Hypertension Maternal Grandmother    Stroke Maternal Grandmother    Past Surgical History:  Procedure Laterality Date   BLADDER SURGERY     x2 for scar tissue   CHOLECYSTECTOMY  1995   cysts on ovaries     polyp removal  1984   from fallopian tube   ROTATOR CUFF REPAIR Left 06/2017   THUMB SURGERY Right 01/16/2016   TOTAL KNEE ARTHROPLASTY Right 08/01/2015   Procedure: RIGHT TOTAL KNEE ARTHROPLASTY;  Surgeon: Ollen Gross, MD;  Location: [facility];  Service: Orthopedics;  Laterality: Right;   TUBAL LIGATION     Social History   Social History Narrative   Not on file   Immunization History  Administered Date(s) Administered   Influenza Whole 08/05/2004   Influenza, High Dose Seasonal PF 08/04/2018   Influenza,inj,Quad PF,6+ Mos 11/22/2021   Influenza-Unspecified 08/05/2013   PFIZER(Purple Top)SARS-COV-2 Vaccination 12/04/2019, 12/26/2019, 08/22/2020    Td 07/06/2004     Objective: Vital Signs: BP 129/77 (BP Location: Left Arm, Patient Position: Sitting, Cuff Size: Normal)   Pulse 93   Resp 14   Ht 5\' 4"  (1.626 m)   Wt 174 lb (78.9 kg)   BMI 29.87 kg/m    Physical Exam Vitals and nursing note reviewed.  Constitutional:      Appearance: She is well-developed.  HENT:     Head: Normocephalic and atraumatic.  Eyes:     Conjunctiva/sclera: Conjunctivae normal.  Cardiovascular:     Rate and Rhythm: Normal rate and regular rhythm.     Heart sounds: Normal heart sounds.  Pulmonary:     Effort: Pulmonary effort is normal.     Breath sounds: Normal breath sounds.  Abdominal:     General: Bowel sounds are normal.  Palpations: Abdomen is soft.  Musculoskeletal:     Cervical back: Normal range of motion.  Lymphadenopathy:     Cervical: No cervical adenopathy.  Skin:    General: Skin is warm and dry.     Capillary Refill: Capillary refill takes less than 2 seconds.  Neurological:     Mental Status: She is alert and oriented to person, place, and time.  Psychiatric:        Behavior: Behavior normal.      Musculoskeletal Exam: C-spine has limited range of motion with lateral rotation.  Postural thoracic is noted.  Right shoulder has good range of motion.  Left shoulder partial replacement has limited range of motion with discomfort.  Elbow joints have good range of motion.  CMC, PIP, DIP thickening consistent with osteoarthritis of both hands.  Hip joints have good range of motion with some groin pain bilaterally.  Tenderness over both trochanteric bursa extending along the IT band bilaterally.  Discomfort with range of motion of the right knee replacement.  Left knee has good range of motion with no warmth or effusion.  Ankle joints have good range of motion with no tenderness or synovitis.  CDAI Exam: CDAI Score: -- Patient Global: --; Provider Global: -- Swollen: --; Tender: -- Joint Exam 03/25/2023   No joint exam has  been documented for this visit   There is currently no information documented on the homunculus. Go to the Rheumatology activity and complete the homunculus joint exam.  Investigation: No additional findings.  Imaging: No results found.  Recent Labs: Lab Results  Component Value Date   WBC 9.7 07/12/2020   HGB 13.7 07/12/2020   PLT 219 07/12/2020   NA 138 07/12/2020   K 3.9 07/12/2020   CL 102 07/12/2020   CO2 26 07/12/2020   GLUCOSE 90 07/12/2020   BUN 16 07/12/2020   CREATININE 0.91 07/12/2020   BILITOT 0.4 07/12/2020   ALKPHOS 82 01/15/2017   AST 17 07/12/2020   ALT 18 07/12/2020   PROT 6.5 07/12/2020   ALBUMIN 4.0 01/15/2017   CALCIUM 9.2 07/12/2020   GFRAA 73 07/12/2020    Speciality Comments: PLQ EYE EXAM 12/09/2020 [facility]  Procedures:  Large Joint Inj: R greater trochanter on 03/25/2023 2:05 PM Indications: pain Details: 27 G 1.5 in needle, lateral approach  Arthrogram: No  Medications: 1.5 mL lidocaine 1 %; 40 mg triamcinolone acetonide 40 MG/ML Aspirate: 0 mL Outcome: tolerated well, no immediate complications Procedure, treatment alternatives, risks and benefits explained, specific risks discussed. Consent was given by the patient. Immediately prior to procedure a time out was called to verify the correct patient, procedure, equipment, support staff and site/side marked as required. Patient was prepped and draped in the usual sterile fashion.     Allergies: Cephalexin, Oyster shell, Penicillins, Shellfish allergy, Codeine, Doxycycline, and Erythromycin   Assessment / Plan:     Visit Diagnoses: Sjogren's syndrome with other organ involvement (HCC) - ANA 1: 40 speckled, Ro negative, La negative, RF negative: Patient has not been seen in the office since 07/18/2021.  She continues to have chronic sicca symptoms.  She has been under the care of her dermatologist for management of Sjogren's syndrome and alopecia.  She is taking Plaquenil  200 mg 1 tablet daily along with methotrexate 3 tablets by mouth once weekly.  She has been seeing the ophthalmologist and dentist at least every 6 months for routine follow-up.  Offered to update autoimmune lab work including SPEP but she  declined at this time.  Discussed the increased risk for developing lymphoma in patients with Sjogren's syndrome.  Offered a follow-up visit in 6 months but she would like to follow-up on an as-needed basis.  High risk medication use - Plaquenil 200 mg 1 tablet by mouth daily and methotrexate 3 tablets once weekly.  Prescribed by dermatology.  She has been having updated Plaquenil eye examinations on a yearly basis but we do not have the most recent records.  Fibromyalgia -Patient presents today with symptoms consistent with trochanteric bursitis and IT band syndrome bilaterally.  She has some generalized hyperalgesia and positive tender points on examination today.  After informed consent the right trochanter bursa was injected with cortisone.  She tolerated procedure well.  Referral to physical therapy will be placed today.  Patient also requested a refill of methocarbamol to be sent to the pharmacy.  Plan: Ambulatory referral to Physical Therapy  Other fatigue: Chronic, stable.    Other insomnia: Interrupted sleep at night.    Trochanteric bursitis of both hips - Patient presents today with discomfort in both hips consistent with trochanteric bursitis.  She has been walking 10,000-12,000 steps daily for exercise but has been having increased nocturnal pain when lying on her sides at night.  Her symptoms have been most severe on the right side.  On examination she has good range of motion of both hip joints with some discomfort bilaterally.  She has tenderness over bilateral trochanteric bursa extending down the IT band bilaterally.  After informed consent the right trochanteric bursa was injected with cortisone.  She tolerated the procedure well.  Procedure note was  completed above.  Aftercare was discussed.  A referral to physical therapy will be placed today.  She was advised to notify us if her symptoms persist or worsen.  Plan: Ambulatory referral to Physical Therapy  It band syndrome, left - A referral to physical therapy was placed today for bilateral trochanter bursitis and IT band syndrome.  Plan: Ambulatory referral to Physical Therapy  It band syndrome, right - A referral to PT was placed today.  She was advised to notify us if her symptoms persist or worsen .  plan: Ambulatory referral to Physical Therapy  Primary osteoarthritis of both hands - HLA-B27 was negative in the past.  CMC, PIP, DIP thickening consistent with osteoarthritis of both hands.  No synovitis or dactylitis was noted today.  Status post left rotator cuff repair: Chronic pain.  Limited range of motion.  History of total knee replacement, right: Performed by Dr. Antony Odea.  She has been wearing a brace while exercising for added support.  She has good range of motion of the right knee replacement on examination today.  No effusion was noted.  Primary osteoarthritis of both feet:   Good range of motion of both ankle joints with no tenderness or synovitis.  DDD (degenerative disc disease), cervical: Limited ROM with lateral rotation.   DDD (degenerative disc disease), lumbar: Chronic pain/   Other medical conditions are listed as follows:   History of gastroesophageal reflux (GERD)  History of hypothyroidism  History of depression  Interstitial cystitis    Orders: Orders Placed This Encounter  Procedures   Large Joint Inj: R greater trochanter   Ambulatory referral to Physical Therapy   Meds ordered this encounter  Medications   methocarbamol (ROBAXIN) 500 MG tablet    Sig: TAKE 1 TABLET BY MOUTH DAILY AS NEEDED FOR MUSCLE SPASM    Dispense:  30 tablet  Refill:  0    Follow-Up Instructions: Return if symptoms worsen or fail to improve, for Sjogren's  syndrome.   Gearldine Bienenstock, PA-C  Note - This record has been created using Dragon software.  Chart creation errors have been sought, but may not always  have been located. Such creation errors do not reflect on  the standard of medical care.

## 2023-03-25 ENCOUNTER — Encounter: Payer: Self-pay | Admitting: Physician Assistant

## 2023-03-25 ENCOUNTER — Ambulatory Visit: Payer: Medicare PPO | Attending: Physician Assistant | Admitting: Physician Assistant

## 2023-03-25 VITALS — BP 129/77 | HR 93 | Resp 14 | Ht 64.0 in | Wt 174.0 lb

## 2023-03-25 DIAGNOSIS — R5383 Other fatigue: Secondary | ICD-10-CM

## 2023-03-25 DIAGNOSIS — Z8639 Personal history of other endocrine, nutritional and metabolic disease: Secondary | ICD-10-CM

## 2023-03-25 DIAGNOSIS — Z79899 Other long term (current) drug therapy: Secondary | ICD-10-CM

## 2023-03-25 DIAGNOSIS — M7061 Trochanteric bursitis, right hip: Secondary | ICD-10-CM

## 2023-03-25 DIAGNOSIS — M5136 Other intervertebral disc degeneration, lumbar region: Secondary | ICD-10-CM

## 2023-03-25 DIAGNOSIS — M7062 Trochanteric bursitis, left hip: Secondary | ICD-10-CM

## 2023-03-25 DIAGNOSIS — M797 Fibromyalgia: Secondary | ICD-10-CM

## 2023-03-25 DIAGNOSIS — M19071 Primary osteoarthritis, right ankle and foot: Secondary | ICD-10-CM

## 2023-03-25 DIAGNOSIS — M3509 Sicca syndrome with other organ involvement: Secondary | ICD-10-CM

## 2023-03-25 DIAGNOSIS — M19041 Primary osteoarthritis, right hand: Secondary | ICD-10-CM | POA: Diagnosis not present

## 2023-03-25 DIAGNOSIS — M7631 Iliotibial band syndrome, right leg: Secondary | ICD-10-CM

## 2023-03-25 DIAGNOSIS — M51369 Other intervertebral disc degeneration, lumbar region without mention of lumbar back pain or lower extremity pain: Secondary | ICD-10-CM

## 2023-03-25 DIAGNOSIS — G4709 Other insomnia: Secondary | ICD-10-CM | POA: Diagnosis not present

## 2023-03-25 DIAGNOSIS — Z8659 Personal history of other mental and behavioral disorders: Secondary | ICD-10-CM

## 2023-03-25 DIAGNOSIS — M7632 Iliotibial band syndrome, left leg: Secondary | ICD-10-CM

## 2023-03-25 DIAGNOSIS — Z8719 Personal history of other diseases of the digestive system: Secondary | ICD-10-CM

## 2023-03-25 DIAGNOSIS — Z96651 Presence of right artificial knee joint: Secondary | ICD-10-CM | POA: Diagnosis not present

## 2023-03-25 DIAGNOSIS — M5030 Other cervical disc degeneration, unspecified cervical region: Secondary | ICD-10-CM

## 2023-03-25 DIAGNOSIS — M19072 Primary osteoarthritis, left ankle and foot: Secondary | ICD-10-CM

## 2023-03-25 DIAGNOSIS — Z9889 Other specified postprocedural states: Secondary | ICD-10-CM | POA: Diagnosis not present

## 2023-03-25 DIAGNOSIS — N3010 Interstitial cystitis (chronic) without hematuria: Secondary | ICD-10-CM

## 2023-03-25 DIAGNOSIS — M19042 Primary osteoarthritis, left hand: Secondary | ICD-10-CM

## 2023-03-25 MED ORDER — METHOCARBAMOL 500 MG PO TABS: ORAL_TABLET | ORAL | 0 refills | Status: DC

## 2023-03-25 MED ORDER — LIDOCAINE HCL 1 % IJ SOLN
1.5000 mL | INTRAMUSCULAR | Status: AC | PRN
Start: 2023-03-25 — End: 2023-03-25
  Administered 2023-03-25: 1.5 mL

## 2023-03-25 MED ORDER — TRIAMCINOLONE ACETONIDE 40 MG/ML IJ SUSP
40.0000 mg | INTRAMUSCULAR | Status: AC | PRN
Start: 2023-03-25 — End: 2023-03-25
  Administered 2023-03-25: 40 mg via INTRA_ARTICULAR

## 2023-04-11 DIAGNOSIS — F3340 Major depressive disorder, recurrent, in remission, unspecified: Secondary | ICD-10-CM | POA: Diagnosis not present

## 2023-04-11 DIAGNOSIS — D84821 Immunodeficiency due to drugs: Secondary | ICD-10-CM | POA: Diagnosis not present

## 2023-04-11 DIAGNOSIS — E2839 Other primary ovarian failure: Secondary | ICD-10-CM | POA: Diagnosis not present

## 2023-04-11 DIAGNOSIS — Z79631 Long term (current) use of antimetabolite agent: Secondary | ICD-10-CM | POA: Diagnosis not present

## 2023-04-11 DIAGNOSIS — M3509 Sicca syndrome with other organ involvement: Secondary | ICD-10-CM | POA: Diagnosis not present

## 2023-04-11 DIAGNOSIS — Z0000 Encounter for general adult medical examination without abnormal findings: Secondary | ICD-10-CM | POA: Diagnosis not present

## 2023-04-11 DIAGNOSIS — L661 Lichen planopilaris: Secondary | ICD-10-CM | POA: Diagnosis not present

## 2023-04-11 DIAGNOSIS — Z23 Encounter for immunization: Secondary | ICD-10-CM | POA: Diagnosis not present

## 2023-04-11 DIAGNOSIS — N3010 Interstitial cystitis (chronic) without hematuria: Secondary | ICD-10-CM | POA: Diagnosis not present

## 2023-04-23 ENCOUNTER — Other Ambulatory Visit: Payer: Self-pay

## 2023-04-23 ENCOUNTER — Encounter: Payer: Self-pay | Admitting: Internal Medicine

## 2023-04-23 MED ORDER — ESOMEPRAZOLE MAGNESIUM 40 MG PO CPDR: DELAYED_RELEASE_CAPSULE | ORAL | 3 refills | Status: AC

## 2023-04-25 ENCOUNTER — Other Ambulatory Visit (HOSPITAL_BASED_OUTPATIENT_CLINIC_OR_DEPARTMENT_OTHER): Payer: Self-pay | Admitting: Family Medicine

## 2023-04-25 DIAGNOSIS — Z136 Encounter for screening for cardiovascular disorders: Secondary | ICD-10-CM

## 2023-04-26 ENCOUNTER — Ambulatory Visit (HOSPITAL_BASED_OUTPATIENT_CLINIC_OR_DEPARTMENT_OTHER)
Admission: RE | Admit: 2023-04-26 | Discharge: 2023-04-26 | Disposition: A | Discharge status: Home / Self Care | Payer: Medicare PPO | Source: Ambulatory Visit | Attending: Family Medicine | Admitting: Family Medicine

## 2023-04-26 DIAGNOSIS — Z136 Encounter for screening for cardiovascular disorders: Secondary | ICD-10-CM | POA: Insufficient documentation

## 2023-04-29 ENCOUNTER — Encounter (HOSPITAL_BASED_OUTPATIENT_CLINIC_OR_DEPARTMENT_OTHER): Payer: Self-pay | Admitting: Physical Therapy

## 2023-04-29 ENCOUNTER — Other Ambulatory Visit: Payer: Self-pay

## 2023-04-29 ENCOUNTER — Ambulatory Visit (HOSPITAL_BASED_OUTPATIENT_CLINIC_OR_DEPARTMENT_OTHER): Payer: Medicare PPO | Attending: Physician Assistant | Admitting: Physical Therapy

## 2023-04-29 DIAGNOSIS — M7632 Iliotibial band syndrome, left leg: Secondary | ICD-10-CM | POA: Insufficient documentation

## 2023-04-29 DIAGNOSIS — M62830 Muscle spasm of back: Secondary | ICD-10-CM | POA: Diagnosis not present

## 2023-04-29 DIAGNOSIS — M7062 Trochanteric bursitis, left hip: Secondary | ICD-10-CM | POA: Insufficient documentation

## 2023-04-29 DIAGNOSIS — R2689 Other abnormalities of gait and mobility: Secondary | ICD-10-CM | POA: Insufficient documentation

## 2023-04-29 DIAGNOSIS — M7631 Iliotibial band syndrome, right leg: Secondary | ICD-10-CM | POA: Insufficient documentation

## 2023-04-29 DIAGNOSIS — M797 Fibromyalgia: Secondary | ICD-10-CM | POA: Diagnosis not present

## 2023-04-29 DIAGNOSIS — M25552 Pain in left hip: Secondary | ICD-10-CM | POA: Diagnosis not present

## 2023-04-29 DIAGNOSIS — E2839 Other primary ovarian failure: Secondary | ICD-10-CM | POA: Diagnosis not present

## 2023-04-29 DIAGNOSIS — N958 Other specified menopausal and perimenopausal disorders: Secondary | ICD-10-CM | POA: Diagnosis not present

## 2023-04-29 DIAGNOSIS — M7061 Trochanteric bursitis, right hip: Secondary | ICD-10-CM | POA: Diagnosis not present

## 2023-04-29 DIAGNOSIS — M25551 Pain in right hip: Secondary | ICD-10-CM | POA: Diagnosis not present

## 2023-04-29 NOTE — Therapy (Unsigned)
OUTPATIENT PHYSICAL THERAPY LOWER EXTREMITY EVALUATION   Patient Name: Susan Carpenter MRN: [id number] DOB:[birth year], 77 y.o., female Today's Date: 04/30/2023  END OF SESSION:  PT End of Session - 04/29/23 1619     Visit Number 1    Number of Visits 12    Date for PT Re-Evaluation 06/10/23    PT Start Time 1345    PT Stop Time 1428    PT Time Calculation (min) 43 min    Activity Tolerance Patient tolerated treatment well    Behavior During Therapy WFL for tasks assessed/performed             Past Medical History:  Diagnosis Date   Allergy    rhinitis   ANA positive    Arthritis    Complication of anesthesia    Constipation    Fibromyalgia    Frontal fibrosing alopecia    Dr. Corky Downs    GERD (gastroesophageal reflux disease)    Heart murmur    Helicobacter pylori (H. pylori) infection    s/p treatment   Hiatal hernia    Interstitial cystitis    Pain in joint    site unspecified   PONV (postoperative nausea and vomiting)    Psoriasis    Psoriatic arthritis (HCC)    Past Surgical History:  Procedure Laterality Date   BLADDER SURGERY     x2 for scar tissue   CHOLECYSTECTOMY  1995   cysts on ovaries     polyp removal  1984   from fallopian tube   ROTATOR CUFF REPAIR Left 06/2017   THUMB SURGERY Right 01/16/2016   TOTAL KNEE ARTHROPLASTY Right 08/01/2015   Procedure: RIGHT TOTAL KNEE ARTHROPLASTY;  Surgeon: Ollen Gross, MD;  Location: [facility];  Service: Orthopedics;  Laterality: Right;   TUBAL LIGATION     Patient Active Problem List   Diagnosis Date Noted   Status post left rotator cuff repair 10/01/2018   History of total knee replacement, right 10/01/2018   DDD (degenerative disc disease), cervical 10/01/2018   DDD (degenerative disc disease), lumbar 10/01/2018   Cough variant asthma vs uacs 02/02/2018   Chronic cough 12/20/2017   Other insomnia 01/15/2017   Other fatigue 01/15/2017   Trapezius muscle spasm 01/15/2017   Alopecia, scarring  09/21/2016   Primary osteoarthritis of both hands 09/19/2016   OA (osteoarthritis) of knee 08/01/2015   INTERSTITIAL CYSTITIS 01/31/2010   Fibromyalgia 01/31/2010   FATIGUE 01/31/2010   DYSPNEA/SHORTNESS OF BREATH 01/31/2010   FREQUENCY, URINARY 01/31/2010   Depression 01/24/2009   Migraines 09/04/2008   HELICOBACTER PYLORI INFECTION 10/14/2007   Allergic rhinitis 10/14/2007   HIATAL HERNIA 10/14/2007   ANA POSITIVE 10/14/2007   Hypothyroidism 10/10/2007   PAIN IN JOINT, SITE UNSPECIFIED 10/10/2007   WEIGHT GAIN 10/10/2007    PCP: Dr Selena Batten  REFERRING PROVIDER: Dr Sherron Ales   REFERRING DIAG:  Diagnosis  M79.7 (ICD-10-CM) - Fibromyalgia  M70.61,M70.62 (ICD-10-CM) - Trochanteric bursitis of both hips  M76.32 (ICD-10-CM) - It band syndrome, left  M76.31 (ICD-10-CM) - It band syndrome, right    THERAPY DIAG:  Pain in right hip  Pain in left hip  Other abnormalities of gait and mobility  Muscle spasm of back  Rationale for Evaluation and Treatment: Rehabilitation  ONSET DATE:   SUBJECTIVE:   SUBJECTIVE STATEMENT: Patient reports a progressive increase in bilateral hip pain over the past few months. She has a history of a total knee replacement but it is remote. She was seen by  her knee surgeon who does not believe her pain is coming from the replacement. She feels like the pain is worse on the right. The pain increases as there day goes on. She find it hard to sleep on her sides at night.    PERTINENT HISTORY: Arthritis in both ankles, knees, and hands  PAIN:  Are you having pain? Yes: NPRS scale: 6/10 Pain location: right hip  Pain description: aching  Aggravating factors: standing and walking  Relieving factors: rest   Yes: NPRS scale: 6/10 Pain location: right hip  Pain description: aching  Aggravating factors: standing and walking  Relieving factors: rest   PRECAUTIONS: None  WEIGHT BEARING RESTRICTIONS: No  FALLS:  Has patient fallen in  last 6 months? Yes. Number of falls 2 weeks ago: moving a dog crate and she tripped  LIVING ENVIRONMENT: 4 steps from the garage to house. Has a second floor but doesn't do them daily  OCCUPATION:  Retired:   Hobbies:   Walking Reading   PLOF: Independent  PATIENT GOALS:   To have less pain   NEXT MD VISIT:  Nothing at this time   OBJECTIVE:   DIAGNOSTIC FINDINGS:   PATIENT SURVEYS:  FOTO    COGNITION: Overall cognitive status: Within functional limits for tasks assessed     SENSATION: Right hand numbness at times   EDEMA:  Nothing noted  MUSCLE LENGTH:  POSTURE: No Significant postural limitations  PALPATION:   LOWER EXTREMITY ROM:  Passive ROM Right eval Left eval  Hip flexion WNL WNL  Hip extension    Hip abduction    Hip adduction    Hip internal rotation Mild pain  Mild pain   Hip external rotation    Knee flexion    Knee extension    Ankle dorsiflexion    Ankle plantarflexion    Ankle inversion    Ankle eversion     (Blank rows = not tested)  LOWER EXTREMITY MMT:  MMT Right eval Left eval  Hip flexion 23.8 20.6  Hip extension    Hip abduction 15.8 23.8  Hip adduction    Hip internal rotation    Hip external rotation    Knee flexion    Knee extension 18.1 21.1  Ankle dorsiflexion    Ankle plantarflexion    Ankle inversion    Ankle eversion     (Blank rows = not tested)   GAIT: Lateral movement from right towards left.   TODAY'S TREATMENT:                                                                                                                              DATE:   Access Code: [email or web address] URL: [email or web address] Date: 04/29/2023 Prepared by: Lorayne Bender  Exercises - Supine Lower Trunk Rotation  - 1 x daily - 7 x weekly - 3 sets - 10 reps - Supine Piriformis Stretch with Foot on Ground  - 1 x daily -  7 x weekly - 3 sets - 3 reps - 20sec hold - Theracane Over Shoulder  - 1 x daily - 7 x weekly - 3  sets - 1-2 min  hold - Standing Glute Med Mobilization with Small Ball on Wall  - 1 x daily - 7 x weekly - 3 sets - 1-2 min  hold   PATIENT EDUCATION:  Education details: HEP, symptom management,  Person educated: Patient Education method: Explanation, Demonstration, Tactile cues, Verbal cues, and Handouts Education comprehension: verbalized understanding, returned demonstration, verbal cues required, tactile cues required, and needs further education  HOME EXERCISE PROGRAM: Access Code: [email or web address] URL: [email or web address] Date: 04/29/2023 Prepared by: Lorayne Bender  Exercises - Supine Lower Trunk Rotation  - 1 x daily - 7 x weekly - 3 sets - 10 reps - Supine Piriformis Stretch with Foot on Ground  - 1 x daily - 7 x weekly - 3 sets - 3 reps - 20sec hold - Theracane Over Shoulder  - 1 x daily - 7 x weekly - 3 sets - 1-2 min  hold - Standing Glute Med Mobilization with Small Ball on Wall  - 1 x daily - 7 x weekly - 3 sets - 1-2 min  hold  ASSESSMENT:  CLINICAL IMPRESSION: Patient is a 77 year old female with bilateral hip pain and left knee pain. She has limitations in strength and general right LE stability. She has tenderness to palpation in bilateral gluteals and in her medial knee. She has had a positive reaction to TPDN in the past. She would benefit from skilled therapy to improve strength, reduce trigger points and improve functional mobility.  OBJECTIVE IMPAIRMENTS: Abnormal gait, decreased activity tolerance, decreased endurance, decreased mobility, difficulty walking, decreased ROM, decreased strength, increased fascial restrictions, increased muscle spasms, and pain.   ACTIVITY LIMITATIONS: carrying, lifting, standing, squatting, sleeping, stairs, transfers, and locomotion level  PARTICIPATION LIMITATIONS: meal prep, cleaning, driving, shopping, community activity, and yard work  PERSONAL FACTORS: 1-2 comorbidities: knee replacement  are also affecting patient's  functional outcome.   REHAB POTENTIAL: Good  CLINICAL DECISION MAKING: Evolving/moderate complexityincreasing pain with decreasing ability to ambualate   EVALUATION COMPLEXITY: Moderate   GOALS: Goals reviewed with patient? Yes  SHORT TERM GOALS: Target date: 05/28/2023   Patient will report a 50% reduction in tenderness to palpation and trigger points in the glutes Baseline: Goal status: INITIAL  2.  Patient will increase gross bilateral lower extremity strength by 5 pounds Baseline:  Goal status: INITIAL  3.  Patient will be independent with basic HEP Baseline:  Goal status: INITIAL LONG TERM GOALS: Target date: 06/25/2023    Patient will ambulate community distances without increased pain Baseline:  Goal status: INITIAL  2.  Patient will stand for 45 minutes without increased pain in order to perform daily tasks Baseline:  Goal status: INITIAL  3.  Patient will have complete stretching and strengthening program Baseline:  Goal status: INITIAL    PLAN:  PT FREQUENCY: 2x/week  PT DURATION: 8 weeks  PLANNED INTERVENTIONS: Therapeutic exercises, Therapeutic activity, Neuromuscular re-education, Balance training, Gait training, Patient/Family education, Self Care, Joint mobilization, Stair training, Orthotic/Fit training, Aquatic Therapy, Dry Needling, Taping, Ultrasound, and Manual therapy  PLAN FOR NEXT SESSION: Begin with trigger point dry needling and manual therapy to bilateral hips.  Review HEP.  Consider straight leg raise, quad set, short arc quad for knee stability.  Consider slow march with bilateral legs with cueing to reduce lateral movement.  Progress exercises as tolerated  consider clamshell in her lateral clam.  Referring diagnosis?  M79.7 (ICD-10-CM) - Fibromyalgia  M70.61,M70.62 (ICD-10-CM) - Trochanteric bursitis of both hips  M76.32 (ICD-10-CM) - It band syndrome, left  M76.31 (ICD-10-CM) - It band syndrome, right   Treatment diagnosis? (if  different than referring diagnosis)  What was this (referring dx) caused by? []  Surgery []  Fall [x]  Ongoing issue []  Arthritis []  Other: ____________  Laterality: []  Rt []  Lt []  Both  Check all possible CPT codes:  *CHOOSE 10 OR LESS*    []  97110 (Therapeutic Exercise)  []  92507 (SLP Treatment)  []  97112 (Neuro Re-ed)   []  92526 (Swallowing Treatment)   []  97116 (Gait Training)   []  [id number] (Cognitive Training, 1st 15 minutes) []  16109 (Manual Therapy)   []  97130 (Cognitive Training, each add'l 15 minutes)  []  97164 (Re-evaluation)                              []  Other, List CPT Code ____________  []  97530 (Therapeutic Activities)     []  97535 (Self Care)   [x]  All codes above (97110 - 97535)  []  97012 (Mechanical Traction)  []  97014 (E-stim Unattended)  []  97032 (E-stim manual)  []  97033 (Ionto)  [x]  97035 (Ultrasound) []  97750 (Physical Performance Training) [x]  [id number] (Aquatic Therapy) []  97016 (Vasopneumatic Device) []  60454 (Paraffin) []  97034 (Contrast Bath) []  97597 (Wound Care 1st 20 sq cm) []  97598 (Wound Care each add'l 20 sq cm) []  97760 (Orthotic Fabrication, Fitting, Training Initial) []  [id number] (Prosthetic Management and Training Initial) []  [id number] (Orthotic or Prosthetic Training/ Modification Subsequent)  Dessie Coma, PT 04/30/2023, 1:31 PM

## 2023-04-30 DIAGNOSIS — H04123 Dry eye syndrome of bilateral lacrimal glands: Secondary | ICD-10-CM | POA: Diagnosis not present

## 2023-04-30 NOTE — Addendum Note (Signed)
Addended by: Dessie Coma on: 04/30/2023 02:04 PM   Modules accepted: Orders

## 2023-05-02 DIAGNOSIS — H04123 Dry eye syndrome of bilateral lacrimal glands: Secondary | ICD-10-CM | POA: Diagnosis not present

## 2023-05-15 ENCOUNTER — Ambulatory Visit (HOSPITAL_BASED_OUTPATIENT_CLINIC_OR_DEPARTMENT_OTHER): Payer: Medicare PPO | Attending: Physician Assistant | Admitting: Physical Therapy

## 2023-05-15 DIAGNOSIS — M62830 Muscle spasm of back: Secondary | ICD-10-CM | POA: Insufficient documentation

## 2023-05-15 DIAGNOSIS — M25551 Pain in right hip: Secondary | ICD-10-CM | POA: Insufficient documentation

## 2023-05-15 DIAGNOSIS — R2689 Other abnormalities of gait and mobility: Secondary | ICD-10-CM | POA: Diagnosis not present

## 2023-05-15 DIAGNOSIS — M25552 Pain in left hip: Secondary | ICD-10-CM | POA: Insufficient documentation

## 2023-05-15 NOTE — Therapy (Signed)
OUTPATIENT PHYSICAL THERAPY LOWER EXTREMITY EVALUATION   Patient Name: Susan Carpenter MRN: [id number] DOB:11/20/1945, 77 y.o., female Today's Date: 05/15/2023  END OF SESSION:  PT End of Session - 05/15/23 1502     Visit Number 2    Number of Visits 12    Date for PT Re-Evaluation 06/10/23    PT Start Time 1100    PT Stop Time 1144    PT Time Calculation (min) 44 min    Activity Tolerance Patient tolerated treatment well    Behavior During Therapy WFL for tasks assessed/performed             Past Medical History:  Diagnosis Date   Allergy    rhinitis   ANA positive    Arthritis    Complication of anesthesia    Constipation    Fibromyalgia    Frontal fibrosing alopecia    Dr. Corky Downs    GERD (gastroesophageal reflux disease)    Heart murmur    Helicobacter pylori (H. pylori) infection    s/p treatment   Hiatal hernia    Interstitial cystitis    Pain in joint    site unspecified   PONV (postoperative nausea and vomiting)    Psoriasis    Psoriatic arthritis (HCC)    Past Surgical History:  Procedure Laterality Date   BLADDER SURGERY     x2 for scar tissue   CHOLECYSTECTOMY  1995   cysts on ovaries     polyp removal  1984   from fallopian tube   ROTATOR CUFF REPAIR Left 06/2017   THUMB SURGERY Right 01/16/2016   TOTAL KNEE ARTHROPLASTY Right 08/01/2015   Procedure: RIGHT TOTAL KNEE ARTHROPLASTY;  Surgeon: Ollen Gross, MD;  Location: [facility];  Service: Orthopedics;  Laterality: Right;   TUBAL LIGATION     Patient Active Problem List   Diagnosis Date Noted   Status post left rotator cuff repair 10/01/2018   History of total knee replacement, right 10/01/2018   DDD (degenerative disc disease), cervical 10/01/2018   DDD (degenerative disc disease), lumbar 10/01/2018   Cough variant asthma vs uacs 02/02/2018   Chronic cough 12/20/2017   Other insomnia 01/15/2017   Other fatigue 01/15/2017   Trapezius muscle spasm 01/15/2017   Alopecia, scarring  09/21/2016   Primary osteoarthritis of both hands 09/19/2016   OA (osteoarthritis) of knee 08/01/2015   INTERSTITIAL CYSTITIS 01/31/2010   Fibromyalgia 01/31/2010   FATIGUE 01/31/2010   DYSPNEA/SHORTNESS OF BREATH 01/31/2010   FREQUENCY, URINARY 01/31/2010   Depression 01/24/2009   Migraines 09/04/2008   HELICOBACTER PYLORI INFECTION 10/14/2007   Allergic rhinitis 10/14/2007   HIATAL HERNIA 10/14/2007   ANA POSITIVE 10/14/2007   Hypothyroidism 10/10/2007   PAIN IN JOINT, SITE UNSPECIFIED 10/10/2007   WEIGHT GAIN 10/10/2007    PCP: Dr Selena Batten  REFERRING PROVIDER: Dr Sherron Ales   REFERRING DIAG:  Diagnosis  M79.7 (ICD-10-CM) - Fibromyalgia  M70.61,M70.62 (ICD-10-CM) - Trochanteric bursitis of both hips  M76.32 (ICD-10-CM) - It band syndrome, left  M76.31 (ICD-10-CM) - It band syndrome, right    THERAPY DIAG:  Pain in right hip  Pain in left hip  Other abnormalities of gait and mobility  Muscle spasm of back  Rationale for Evaluation and Treatment: Rehabilitation  ONSET DATE:   SUBJECTIVE:   SUBJECTIVE STATEMENT: The patient reports she has been feeling a little better. She has been working on her stretches  PERTINENT HISTORY: Arthritis in both ankles, knees, and hands  PAIN:  Are  you having pain? Yes: NPRS scale: 6/10 Pain location: right hip  Pain description: aching  Aggravating factors: standing and walking  Relieving factors: rest   Yes: NPRS scale: 6/10 Pain location: right hip  Pain description: aching  Aggravating factors: standing and walking  Relieving factors: rest   PRECAUTIONS: None  WEIGHT BEARING RESTRICTIONS: No  FALLS:  Has patient fallen in last 6 months? Yes. Number of falls 2 weeks ago: moving a dog crate and she tripped  LIVING ENVIRONMENT: 4 steps from the garage to house. Has a second floor but doesn't do them daily  OCCUPATION:  Retired:   Hobbies:   Walking Reading   PLOF: Independent  PATIENT GOALS:   To  have less pain   NEXT MD VISIT:  Nothing at this time   OBJECTIVE:   DIAGNOSTIC FINDINGS:   PATIENT SURVEYS:  FOTO    COGNITION: Overall cognitive status: Within functional limits for tasks assessed     SENSATION: Right hand numbness at times   EDEMA:  Nothing noted  MUSCLE LENGTH:  POSTURE: No Significant postural limitations  PALPATION:   LOWER EXTREMITY ROM:  Passive ROM Right eval Left eval  Hip flexion WNL WNL  Hip extension    Hip abduction    Hip adduction    Hip internal rotation Mild pain  Mild pain   Hip external rotation    Knee flexion    Knee extension    Ankle dorsiflexion    Ankle plantarflexion    Ankle inversion    Ankle eversion     (Blank rows = not tested)  LOWER EXTREMITY MMT:  MMT Right eval Left eval  Hip flexion 23.8 20.6  Hip extension    Hip abduction 15.8 23.8  Hip adduction    Hip internal rotation    Hip external rotation    Knee flexion    Knee extension 18.1 21.1  Ankle dorsiflexion    Ankle plantarflexion    Ankle inversion    Ankle eversion     (Blank rows = not tested)   GAIT: Lateral movement from right towards left.   TODAY'S TREATMENT:                                                                                                                              DATE:   7/10  Trigger Point Dry-Needling  Treatment instructions: Expect mild to moderate muscle soreness. S/S of pneumothorax if dry needled over a lung field, and to seek immediate medical attention should they occur. Patient verbalized understanding of these instructions and education.  Patient Consent Given: Yes Education handout provided: Yes Muscles treated: right gluteal 3x with a .30x50 needle  Electrical stimulation performed: No Parameters: N/A Treatment response/outcome: Great toe with twitch response.  Manual therapy: Trigger point release to gluteal, trigger point release to lower back, skilled palpation of trigger  points  Reviewed and updated HEP  Supine march 2 x 10 Supine hip  abduction 2 x 10 red Bilateral straight leg raise 2 x 10 each leg  All exercises were done in conjunction with TA breathing and and pain-free motions.  Eval: Access Code: [email or web address] URL: [email or web address] Date: 04/29/2023 Prepared by: Lorayne Bender  Exercises - Supine Lower Trunk Rotation  - 1 x daily - 7 x weekly - 3 sets - 10 reps - Supine Piriformis Stretch with Foot on Ground  - 1 x daily - 7 x weekly - 3 sets - 3 reps - 20sec hold - Theracane Over Shoulder  - 1 x daily - 7 x weekly - 3 sets - 1-2 min  hold - Standing Glute Med Mobilization with Small Ball on Wall  - 1 x daily - 7 x weekly - 3 sets - 1-2 min  hold   PATIENT EDUCATION:  Education details: HEP, symptom management,  Person educated: Patient Education method: Explanation, Demonstration, Tactile cues, Verbal cues, and Handouts Education comprehension: verbalized understanding, returned demonstration, verbal cues required, tactile cues required, and needs further education  HOME EXERCISE PROGRAM: Access Code: [email or web address] URL: [email or web address] Date: 04/29/2023 Prepared by: Lorayne Bender  Exercises - Supine Lower Trunk Rotation  - 1 x daily - 7 x weekly - 3 sets - 10 reps - Supine Piriformis Stretch with Foot on Ground  - 1 x daily - 7 x weekly - 3 sets - 3 reps - 20sec hold - Theracane Over Shoulder  - 1 x daily - 7 x weekly - 3 sets - 1-2 min  hold - Standing Glute Med Mobilization with Small Ball on Wall  - 1 x daily - 7 x weekly - 3 sets - 1-2 min  hold  ASSESSMENT:  CLINICAL IMPRESSION: Patient tolerated treatment well.  She had a great twitch response in her gluteal with trigger point dry needling.  If successful she may benefit from needling of the left side as well.  We performed soft tissue mobilization to the gluteal.  We updated her HEP.  We added supine strengthening exercises.  She was advised to use  her stretches when she is feeling painful and use her exercises on a regular basis.  She is advised to try to come up with a system that works for.   Eval: Patient is a 77 year old female with bilateral hip pain and left knee pain. She has limitations in strength and general right LE stability. She has tenderness to palpation in bilateral gluteals and in her medial knee. She has had a positive reaction to TPDN in the past. She would benefit from skilled therapy to improve strength, reduce trigger points and improve functional mobility.  OBJECTIVE IMPAIRMENTS: Abnormal gait, decreased activity tolerance, decreased endurance, decreased mobility, difficulty walking, decreased ROM, decreased strength, increased fascial restrictions, increased muscle spasms, and pain.   ACTIVITY LIMITATIONS: carrying, lifting, standing, squatting, sleeping, stairs, transfers, and locomotion level  PARTICIPATION LIMITATIONS: meal prep, cleaning, driving, shopping, community activity, and yard work  PERSONAL FACTORS: 1-2 comorbidities: knee replacement  are also affecting patient's functional outcome.   REHAB POTENTIAL: Good  CLINICAL DECISION MAKING: Evolving/moderate complexityincreasing pain with decreasing ability to ambualate   EVALUATION COMPLEXITY: Moderate   GOALS: Goals reviewed with patient? Yes  SHORT TERM GOALS: Target date: 05/28/2023   Patient will report a 50% reduction in tenderness to palpation and trigger points in the glutes Baseline: Goal status: INITIAL  2.  Patient will increase gross bilateral lower extremity strength by 5 pounds Baseline:  Goal status: INITIAL  3.  Patient  will be independent with basic HEP Baseline:  Goal status: INITIAL LONG TERM GOALS: Target date: 06/25/2023    Patient will ambulate community distances without increased pain Baseline:  Goal status: INITIAL  2.  Patient will stand for 45 minutes without increased pain in order to perform daily  tasks Baseline:  Goal status: INITIAL  3.  Patient will have complete stretching and strengthening program Baseline:  Goal status: INITIAL    PLAN:  PT FREQUENCY: 2x/week  PT DURATION: 8 weeks  PLANNED INTERVENTIONS: Therapeutic exercises, Therapeutic activity, Neuromuscular re-education, Balance training, Gait training, Patient/Family education, Self Care, Joint mobilization, Stair training, Orthotic/Fit training, Aquatic Therapy, Dry Needling, Taping, Ultrasound, and Manual therapy  PLAN FOR NEXT SESSION: Begin with trigger point dry needling and manual therapy to bilateral hips.  Review HEP.  Consider straight leg raise, quad set, short arc quad for knee stability.  Consider slow march with bilateral legs with cueing to reduce lateral movement.  Progress exercises as tolerated consider clamshell in her lateral clam.  Referring diagnosis?  M79.7 (ICD-10-CM) - Fibromyalgia  M70.61,M70.62 (ICD-10-CM) - Trochanteric bursitis of both hips  M76.32 (ICD-10-CM) - It band syndrome, left  M76.31 (ICD-10-CM) - It band syndrome, right   Treatment diagnosis? (if different than referring diagnosis)  What was this (referring dx) caused by? []  Surgery []  Fall [x]  Ongoing issue []  Arthritis []  Other: ____________  Laterality: []  Rt []  Lt []  Both  Check all possible CPT codes:  *CHOOSE 10 OR LESS*    []  97110 (Therapeutic Exercise)  []  92507 (SLP Treatment)  []  97112 (Neuro Re-ed)   []  92526 (Swallowing Treatment)   []  09811 (Gait Training)   []  [id number] (Cognitive Training, 1st 15 minutes) []  97140 (Manual Therapy)   []  97130 (Cognitive Training, each add'l 15 minutes)  []  97164 (Re-evaluation)                              []  Other, List CPT Code ____________  []  97530 (Therapeutic Activities)     []  97535 (Self Care)   [x]  All codes above (97110 - 97535)  []  97012 (Mechanical Traction)  []  97014 (E-stim Unattended)  []  97032 (E-stim manual)  []  97033 (Ionto)  [x]  97035  (Ultrasound) []  97750 (Physical Performance Training) [x]  91478 (Aquatic Therapy) []  97016 (Vasopneumatic Device) []  [id number] (Paraffin) []  97034 (Contrast Bath) []  97597 (Wound Care 1st 20 sq cm) []  97598 (Wound Care each add'l 20 sq cm) []  97760 (Orthotic Fabrication, Fitting, Training Initial) []  [id number] (Prosthetic Management and Training Initial) []  [id number] (Orthotic or Prosthetic Training/ Modification Subsequent)  Dessie Coma, PT 05/15/2023, 3:27 PM

## 2023-05-20 ENCOUNTER — Encounter (HOSPITAL_BASED_OUTPATIENT_CLINIC_OR_DEPARTMENT_OTHER): Payer: Self-pay | Admitting: Physical Therapy

## 2023-05-20 ENCOUNTER — Ambulatory Visit (HOSPITAL_BASED_OUTPATIENT_CLINIC_OR_DEPARTMENT_OTHER): Payer: Medicare PPO | Admitting: Physical Therapy

## 2023-05-20 DIAGNOSIS — M25551 Pain in right hip: Secondary | ICD-10-CM

## 2023-05-20 DIAGNOSIS — R2689 Other abnormalities of gait and mobility: Secondary | ICD-10-CM

## 2023-05-20 DIAGNOSIS — M62830 Muscle spasm of back: Secondary | ICD-10-CM | POA: Diagnosis not present

## 2023-05-20 DIAGNOSIS — M25552 Pain in left hip: Secondary | ICD-10-CM | POA: Diagnosis not present

## 2023-05-20 NOTE — Therapy (Signed)
OUTPATIENT PHYSICAL THERAPY LOWER EXTREMITY EVALUATION   Patient Name: Susan Carpenter MRN: [id number] DOB:[birth year], 77 y.o., female Today's Date: 05/20/2023  END OF SESSION:  PT End of Session - 05/20/23 1205     Visit Number 3    Number of Visits 12    Date for PT Re-Evaluation 06/10/23    PT Start Time 1157    PT Stop Time 1226    PT Time Calculation (min) 29 min    Activity Tolerance Patient tolerated treatment well    Behavior During Therapy WFL for tasks assessed/performed             Past Medical History:  Diagnosis Date   Allergy    rhinitis   ANA positive    Arthritis    Complication of anesthesia    Constipation    Fibromyalgia    Frontal fibrosing alopecia    Dr. Corky Downs    GERD (gastroesophageal reflux disease)    Heart murmur    Helicobacter pylori (H. pylori) infection    s/p treatment   Hiatal hernia    Interstitial cystitis    Pain in joint    site unspecified   PONV (postoperative nausea and vomiting)    Psoriasis    Psoriatic arthritis (HCC)    Past Surgical History:  Procedure Laterality Date   BLADDER SURGERY     x2 for scar tissue   CHOLECYSTECTOMY  1995   cysts on ovaries     polyp removal  1984   from fallopian tube   ROTATOR CUFF REPAIR Left 06/2017   THUMB SURGERY Right 01/16/2016   TOTAL KNEE ARTHROPLASTY Right 08/01/2015   Procedure: RIGHT TOTAL KNEE ARTHROPLASTY;  Surgeon: Ollen Gross, MD;  Location: [facility];  Service: Orthopedics;  Laterality: Right;   TUBAL LIGATION     Patient Active Problem List   Diagnosis Date Noted   Status post left rotator cuff repair 10/01/2018   History of total knee replacement, right 10/01/2018   DDD (degenerative disc disease), cervical 10/01/2018   DDD (degenerative disc disease), lumbar 10/01/2018   Cough variant asthma vs uacs 02/02/2018   Chronic cough 12/20/2017   Other insomnia 01/15/2017   Other fatigue 01/15/2017   Trapezius muscle spasm 01/15/2017   Alopecia, scarring  09/21/2016   Primary osteoarthritis of both hands 09/19/2016   OA (osteoarthritis) of knee 08/01/2015   INTERSTITIAL CYSTITIS 01/31/2010   Fibromyalgia 01/31/2010   FATIGUE 01/31/2010   DYSPNEA/SHORTNESS OF BREATH 01/31/2010   FREQUENCY, URINARY 01/31/2010   Depression 01/24/2009   Migraines 09/04/2008   HELICOBACTER PYLORI INFECTION 10/14/2007   Allergic rhinitis 10/14/2007   HIATAL HERNIA 10/14/2007   ANA POSITIVE 10/14/2007   Hypothyroidism 10/10/2007   PAIN IN JOINT, SITE UNSPECIFIED 10/10/2007   WEIGHT GAIN 10/10/2007    PCP: Dr Selena Batten  REFERRING PROVIDER: Dr Sherron Ales   REFERRING DIAG:  Diagnosis  M79.7 (ICD-10-CM) - Fibromyalgia  M70.61,M70.62 (ICD-10-CM) - Trochanteric bursitis of both hips  M76.32 (ICD-10-CM) - It band syndrome, left  M76.31 (ICD-10-CM) - It band syndrome, right    THERAPY DIAG:  Pain in right hip  Pain in left hip  Other abnormalities of gait and mobility  Muscle spasm of back  Rationale for Evaluation and Treatment: Rehabilitation  ONSET DATE:   SUBJECTIVE:   SUBJECTIVE STATEMENT: The patient reports he was sore this morning but when she walks her pain went away.  She reports her pain has been intermittent but is still giving her trouble.  She reports she can notice it major difference after the needling session PERTINENT HISTORY: Arthritis in both ankles, knees, and hands  PAIN:  Are you having pain? Yes: NPRS scale: 6/10 Pain location: right hip  Pain description: aching  Aggravating factors: standing and walking  Relieving factors: rest   Yes: NPRS scale: 6/10 Pain location: right hip  Pain description: aching  Aggravating factors: standing and walking  Relieving factors: rest   PRECAUTIONS: None  WEIGHT BEARING RESTRICTIONS: No  FALLS:  Has patient fallen in last 6 months? Yes. Number of falls 2 weeks ago: moving a dog crate and she tripped  LIVING ENVIRONMENT: 4 steps from the garage to house. Has a second  floor but doesn't do them daily  OCCUPATION:  Retired:   Hobbies:   Walking Reading   PLOF: Independent  PATIENT GOALS:   To have less pain   NEXT MD VISIT:  Nothing at this time   OBJECTIVE:   DIAGNOSTIC FINDINGS:   PATIENT SURVEYS:  FOTO    COGNITION: Overall cognitive status: Within functional limits for tasks assessed     SENSATION: Right hand numbness at times   EDEMA:  Nothing noted  MUSCLE LENGTH:  POSTURE: No Significant postural limitations  PALPATION:   LOWER EXTREMITY ROM:  Passive ROM Right eval Left eval  Hip flexion WNL WNL  Hip extension    Hip abduction    Hip adduction    Hip internal rotation Mild pain  Mild pain   Hip external rotation    Knee flexion    Knee extension    Ankle dorsiflexion    Ankle plantarflexion    Ankle inversion    Ankle eversion     (Blank rows = not tested)  LOWER EXTREMITY MMT:  MMT Right eval Left eval  Hip flexion 23.8 20.6  Hip extension    Hip abduction 15.8 23.8  Hip adduction    Hip internal rotation    Hip external rotation    Knee flexion    Knee extension 18.1 21.1  Ankle dorsiflexion    Ankle plantarflexion    Ankle inversion    Ankle eversion     (Blank rows = not tested)   GAIT: Lateral movement from right towards left.   TODAY'S TREATMENT:                                                                                                                              DATE:   7/15 Trigger Point Dry-Needling  Treatment instructions: Expect mild to moderate muscle soreness. S/S of pneumothorax if dry needled over a lung field, and to seek immediate medical attention should they occur. Patient verbalized understanding of these instructions and education.  Patient Consent Given: Yes Education handout provided: Yes Muscles treated: right gluteal 3x with a .30x50 needle  Electrical stimulation performed: No Parameters: N/A Treatment response/outcome: Great toe with twitch  response.  Manual therapy: Trigger point release to gluteal,  trigger point release to lower back, skilled palpation of trigger points  Standing:  March x10 each leg Standing hip abduction x10 each leg  Stading hip extension x10   7/10  Trigger Point Dry-Needling  Treatment instructions: Expect mild to moderate muscle soreness. S/S of pneumothorax if dry needled over a lung field, and to seek immediate medical attention should they occur. Patient verbalized understanding of these instructions and education.  Patient Consent Given: Yes Education handout provided: Yes Muscles treated: right gluteal 3x with a .30x50 needle  Electrical stimulation performed: No Parameters: N/A Treatment response/outcome: Great toe with twitch response.  Manual therapy: Trigger point release to gluteal, trigger point release to lower back, skilled palpation of trigger points  Reviewed and updated HEP  Supine march 2 x 10 Supine hip abduction 2 x 10 red Bilateral straight leg raise 2 x 10 each leg  All exercises were done in conjunction with TA breathing and and pain-free motions.  Eval: Access Code: [email or web address] URL: [email or web address] Date: 04/29/2023 Prepared by: Lorayne Bender  Exercises - Supine Lower Trunk Rotation  - 1 x daily - 7 x weekly - 3 sets - 10 reps - Supine Piriformis Stretch with Foot on Ground  - 1 x daily - 7 x weekly - 3 sets - 3 reps - 20sec hold - Theracane Over Shoulder  - 1 x daily - 7 x weekly - 3 sets - 1-2 min  hold - Standing Glute Med Mobilization with Small Ball on Wall  - 1 x daily - 7 x weekly - 3 sets - 1-2 min  hold   PATIENT EDUCATION:  Education details: HEP, symptom management,  Person educated: Patient Education method: Explanation, Demonstration, Tactile cues, Verbal cues, and Handouts Education comprehension: verbalized understanding, returned demonstration, verbal cues required, tactile cues required, and needs further education  HOME  EXERCISE PROGRAM: Access Code: [email or web address] URL: [email or web address] Date: 04/29/2023 Prepared by: Lorayne Bender  Exercises - Supine Lower Trunk Rotation  - 1 x daily - 7 x weekly - 3 sets - 10 reps - Supine Piriformis Stretch with Foot on Ground  - 1 x daily - 7 x weekly - 3 sets - 3 reps - 20sec hold - Theracane Over Shoulder  - 1 x daily - 7 x weekly - 3 sets - 1-2 min  hold - Standing Glute Med Mobilization with Small Ball on Wall  - 1 x daily - 7 x weekly - 3 sets - 1-2 min  hold  ASSESSMENT:  CLINICAL IMPRESSION: Patient came in today without much pain but with palpation she had significant trigger points in her IT band and gluteal.  Therapy performed trigger point dry needling and right gluteal.  We also performed manual trigger point release to right IT band.  She was shown areas that she can perform self soft tissue mobilization to her IT band.  We reviewed the standing series today.  She went through 1 set of each exercise secondary to time.  She was advised that they cause more pain at home to continue with her supine exercises.  Therapy will continue to progress as tolerated.  Eval: Patient is a 77 year old female with bilateral hip pain and left knee pain. She has limitations in strength and general right LE stability. She has tenderness to palpation in bilateral gluteals and in her medial knee. She has had a positive reaction to TPDN in the past. She would benefit from skilled therapy to improve strength, reduce trigger points and improve functional  mobility.  OBJECTIVE IMPAIRMENTS: Abnormal gait, decreased activity tolerance, decreased endurance, decreased mobility, difficulty walking, decreased ROM, decreased strength, increased fascial restrictions, increased muscle spasms, and pain.   ACTIVITY LIMITATIONS: carrying, lifting, standing, squatting, sleeping, stairs, transfers, and locomotion level  PARTICIPATION LIMITATIONS: meal prep, cleaning, driving, shopping,  community activity, and yard work  PERSONAL FACTORS: 1-2 comorbidities: knee replacement  are also affecting patient's functional outcome.   REHAB POTENTIAL: Good  CLINICAL DECISION MAKING: Evolving/moderate complexityincreasing pain with decreasing ability to ambualate   EVALUATION COMPLEXITY: Moderate   GOALS: Goals reviewed with patient? Yes  SHORT TERM GOALS: Target date: 05/28/2023   Patient will report a 50% reduction in tenderness to palpation and trigger points in the glutes Baseline: Goal status: INITIAL  2.  Patient will increase gross bilateral lower extremity strength by 5 pounds Baseline:  Goal status: INITIAL  3.  Patient will be independent with basic HEP Baseline:  Goal status: INITIAL LONG TERM GOALS: Target date: 06/25/2023    Patient will ambulate community distances without increased pain Baseline:  Goal status: INITIAL  2.  Patient will stand for 45 minutes without increased pain in order to perform daily tasks Baseline:  Goal status: INITIAL  3.  Patient will have complete stretching and strengthening program Baseline:  Goal status: INITIAL    PLAN:  PT FREQUENCY: 2x/week  PT DURATION: 8 weeks  PLANNED INTERVENTIONS: Therapeutic exercises, Therapeutic activity, Neuromuscular re-education, Balance training, Gait training, Patient/Family education, Self Care, Joint mobilization, Stair training, Orthotic/Fit training, Aquatic Therapy, Dry Needling, Taping, Ultrasound, and Manual therapy  PLAN FOR NEXT SESSION: Begin with trigger point dry needling and manual therapy to bilateral hips.  Review HEP.  Consider straight leg raise, quad set, short arc quad for knee stability.  Consider slow march with bilateral legs with cueing to reduce lateral movement.  Progress exercises as tolerated consider clamshell in her lateral clam.  Referring diagnosis?  M79.7 (ICD-10-CM) - Fibromyalgia  M70.61,M70.62 (ICD-10-CM) - Trochanteric bursitis of both hips   M76.32 (ICD-10-CM) - It band syndrome, left  M76.31 (ICD-10-CM) - It band syndrome, right   Treatment diagnosis? (if different than referring diagnosis)  What was this (referring dx) caused by? []  Surgery []  Fall [x]  Ongoing issue []  Arthritis []  Other: ____________  Laterality: []  Rt []  Lt []  Both  Check all possible CPT codes:  *CHOOSE 10 OR LESS*    []  97110 (Therapeutic Exercise)  []  92507 (SLP Treatment)  []  78295 (Neuro Re-ed)   []  92526 (Swallowing Treatment)   []  97116 (Gait Training)   []  [id number] (Cognitive Training, 1st 15 minutes) []  97140 (Manual Therapy)   []  62130 (Cognitive Training, each add'l 15 minutes)  []  97164 (Re-evaluation)                              []  Other, List CPT Code ____________  []  97530 (Therapeutic Activities)     []  97535 (Self Care)   [x]  All codes above (97110 - 97535)  []  97012 (Mechanical Traction)  []  97014 (E-stim Unattended)  []  97032 (E-stim manual)  []  97033 (Ionto)  [x]  97035 (Ultrasound) []  97750 (Physical Performance Training) [x]  [id number] (Aquatic Therapy) []  97016 (Vasopneumatic Device) []  [id number] (Paraffin) []  97034 (Contrast Bath) []  97597 (Wound Care 1st 20 sq cm) []  97598 (Wound Care each add'l 20 sq cm) []  97760 (Orthotic Fabrication, Fitting, Training Initial) []  [id number] (Prosthetic Management and Training Initial) []  [id number] (Orthotic or Prosthetic Training/  Modification Subsequent)  Dessie Coma, PT 05/20/2023, 9:35 PM

## 2023-05-27 ENCOUNTER — Encounter (HOSPITAL_BASED_OUTPATIENT_CLINIC_OR_DEPARTMENT_OTHER): Payer: Self-pay | Admitting: Physical Therapy

## 2023-05-28 ENCOUNTER — Ambulatory Visit (HOSPITAL_BASED_OUTPATIENT_CLINIC_OR_DEPARTMENT_OTHER): Payer: Medicare PPO | Admitting: Physical Therapy

## 2023-05-28 ENCOUNTER — Encounter (HOSPITAL_BASED_OUTPATIENT_CLINIC_OR_DEPARTMENT_OTHER): Payer: Self-pay | Admitting: Physical Therapy

## 2023-05-28 DIAGNOSIS — M25552 Pain in left hip: Secondary | ICD-10-CM | POA: Diagnosis not present

## 2023-05-28 DIAGNOSIS — R2689 Other abnormalities of gait and mobility: Secondary | ICD-10-CM

## 2023-05-28 DIAGNOSIS — M62830 Muscle spasm of back: Secondary | ICD-10-CM | POA: Diagnosis not present

## 2023-05-28 DIAGNOSIS — M25551 Pain in right hip: Secondary | ICD-10-CM

## 2023-05-28 NOTE — Therapy (Signed)
OUTPATIENT PHYSICAL THERAPY LOWER EXTREMITY EVALUATION   Patient Name: Susan Carpenter MRN: [id number] DOB:[birth year], 77 y.o., female Today's Date: 05/28/2023  END OF SESSION:  PT End of Session - 05/28/23 1230     Visit Number 4    Number of Visits 12    Date for PT Re-Evaluation 06/10/23    PT Start Time 1230    PT Stop Time 1250    PT Time Calculation (min) 20 min    Activity Tolerance Patient tolerated treatment well    Behavior During Therapy WFL for tasks assessed/performed             Past Medical History:  Diagnosis Date   Allergy    rhinitis   ANA positive    Arthritis    Complication of anesthesia    Constipation    Fibromyalgia    Frontal fibrosing alopecia    Dr. Corky Downs    GERD (gastroesophageal reflux disease)    Heart murmur    Helicobacter pylori (H. pylori) infection    s/p treatment   Hiatal hernia    Interstitial cystitis    Pain in joint    site unspecified   PONV (postoperative nausea and vomiting)    Psoriasis    Psoriatic arthritis (HCC)    Past Surgical History:  Procedure Laterality Date   BLADDER SURGERY     x2 for scar tissue   CHOLECYSTECTOMY  1995   cysts on ovaries     polyp removal  1984   from fallopian tube   ROTATOR CUFF REPAIR Left 06/2017   THUMB SURGERY Right 01/16/2016   TOTAL KNEE ARTHROPLASTY Right 08/01/2015   Procedure: RIGHT TOTAL KNEE ARTHROPLASTY;  Surgeon: Ollen Gross, MD;  Location: [facility];  Service: Orthopedics;  Laterality: Right;   TUBAL LIGATION     Patient Active Problem List   Diagnosis Date Noted   Status post left rotator cuff repair 10/01/2018   History of total knee replacement, right 10/01/2018   DDD (degenerative disc disease), cervical 10/01/2018   DDD (degenerative disc disease), lumbar 10/01/2018   Cough variant asthma vs uacs 02/02/2018   Chronic cough 12/20/2017   Other insomnia 01/15/2017   Other fatigue 01/15/2017   Trapezius muscle spasm 01/15/2017   Alopecia, scarring  09/21/2016   Primary osteoarthritis of both hands 09/19/2016   OA (osteoarthritis) of knee 08/01/2015   INTERSTITIAL CYSTITIS 01/31/2010   Fibromyalgia 01/31/2010   FATIGUE 01/31/2010   DYSPNEA/SHORTNESS OF BREATH 01/31/2010   FREQUENCY, URINARY 01/31/2010   Depression 01/24/2009   Migraines 09/04/2008   HELICOBACTER PYLORI INFECTION 10/14/2007   Allergic rhinitis 10/14/2007   HIATAL HERNIA 10/14/2007   ANA POSITIVE 10/14/2007   Hypothyroidism 10/10/2007   PAIN IN JOINT, SITE UNSPECIFIED 10/10/2007   WEIGHT GAIN 10/10/2007    PCP: Dr Selena Batten  REFERRING PROVIDER: Dr Sherron Ales   REFERRING DIAG:  Diagnosis  M79.7 (ICD-10-CM) - Fibromyalgia  M70.61,M70.62 (ICD-10-CM) - Trochanteric bursitis of both hips  M76.32 (ICD-10-CM) - It band syndrome, left  M76.31 (ICD-10-CM) - It band syndrome, right    THERAPY DIAG:  Pain in right hip  Pain in left hip  Other abnormalities of gait and mobility  Rationale for Evaluation and Treatment: Rehabilitation  ONSET DATE:   SUBJECTIVE:   SUBJECTIVE STATEMENT: I am not any better, not sleeping any better, ache in both thighs. Not noticing any difference from the exercises. Having a hard time sleeping and am really tired.  When I first went to [facility],  it was just triggered by walking but now they just hurt all the time.  Denies pain past knees. Walk about 11k steps every day.    PERTINENT HISTORY: Arthritis in both ankles, knees, and hands  PAIN:  Are you having pain? Yes: NPRS scale: 6/10 Pain location: right hip  Pain description: aching  Aggravating factors: standing and walking  Relieving factors: rest   Yes: NPRS scale: 6/10 Pain location: right hip  Pain description: aching  Aggravating factors: standing and walking  Relieving factors: rest   PRECAUTIONS: None  WEIGHT BEARING RESTRICTIONS: No  FALLS:  Has patient fallen in last 6 months? Yes. Number of falls 2 weeks ago: moving a dog crate and she  tripped  LIVING ENVIRONMENT: 4 steps from the garage to house. Has a second floor but doesn't do them daily  OCCUPATION:  Retired:   Hobbies:   Walking Reading   PLOF: Independent  PATIENT GOALS:   To have less pain   NEXT MD VISIT:  Nothing at this time   OBJECTIVE:    PATIENT SURVEYS:  FOTO    COGNITION: Overall cognitive status: Within functional limits for tasks assessed     SENSATION: Right hand numbness at times    LOWER EXTREMITY ROM:  Passive ROM Right eval Left eval  Hip flexion WNL WNL  Hip extension    Hip abduction    Hip adduction    Hip internal rotation Mild pain  Mild pain   Hip external rotation    Knee flexion    Knee extension    Ankle dorsiflexion    Ankle plantarflexion    Ankle inversion    Ankle eversion     (Blank rows = not tested)  LOWER EXTREMITY MMT:  MMT Right eval Left eval  Hip flexion 23.8 20.6  Hip extension    Hip abduction 15.8 23.8  Hip adduction    Hip internal rotation    Hip external rotation    Knee flexion    Knee extension 18.1 21.1  Ankle dorsiflexion    Ankle plantarflexion    Ankle inversion    Ankle eversion     (Blank rows = not tested)   GAIT: Lateral movement from right towards left.   TODAY'S TREATMENT:                                                                                                                              DATE:   7/15 Trigger Point Dry-Needling  Treatment instructions: Expect mild to moderate muscle soreness. S/S of pneumothorax if dry needled over a lung field, and to seek immediate medical attention should they occur. Patient verbalized understanding of these instructions and education.  Patient Consent Given: Yes Education handout provided: Yes Muscles treated: right gluteal 3x with a .30x50 needle  Electrical stimulation performed: No Parameters: N/A Treatment response/outcome: Great toe with twitch response.  Manual therapy: Trigger point release to  gluteal, trigger point release  to lower back, skilled palpation of trigger points  Standing:  March x10 each leg Standing hip abduction x10 each leg  Stading hip extension x10   7/10  Trigger Point Dry-Needling  Treatment instructions: Expect mild to moderate muscle soreness. S/S of pneumothorax if dry needled over a lung field, and to seek immediate medical attention should they occur. Patient verbalized understanding of these instructions and education.  Patient Consent Given: Yes Education handout provided: Yes Muscles treated: right gluteal 3x with a .30x50 needle  Electrical stimulation performed: No Parameters: N/A Treatment response/outcome: Great toe with twitch response.  Manual therapy: Trigger point release to gluteal, trigger point release to lower back, skilled palpation of trigger points  Reviewed and updated HEP  Supine march 2 x 10 Supine hip abduction 2 x 10 red Bilateral straight leg raise 2 x 10 each leg  All exercises were done in conjunction with TA breathing and and pain-free motions.  Eval: Access Code: [email or web address] URL: [email or web address] Date: 04/29/2023 Prepared by: Lorayne Bender  Exercises - Supine Lower Trunk Rotation  - 1 x daily - 7 x weekly - 3 sets - 10 reps - Supine Piriformis Stretch with Foot on Ground  - 1 x daily - 7 x weekly - 3 sets - 3 reps - 20sec hold - Theracane Over Shoulder  - 1 x daily - 7 x weekly - 3 sets - 1-2 min  hold - Standing Glute Med Mobilization with Small Ball on Wall  - 1 x daily - 7 x weekly - 3 sets - 1-2 min  hold   PATIENT EDUCATION:  Education details: HEP, symptom management,  Person educated: Patient Education method: Explanation, Demonstration, Tactile cues, Verbal cues, and Handouts Education comprehension: verbalized understanding, returned demonstration, verbal cues required, tactile cues required, and needs further education  HOME EXERCISE PROGRAM: Access Code: [email or web address] URL:  [email or web address] Date: 04/29/2023 Prepared by: Lorayne Bender  Exercises - Supine Lower Trunk Rotation  - 1 x daily - 7 x weekly - 3 sets - 10 reps - Supine Piriformis Stretch with Foot on Ground  - 1 x daily - 7 x weekly - 3 sets - 3 reps - 20sec hold - Theracane Over Shoulder  - 1 x daily - 7 x weekly - 3 sets - 1-2 min  hold - Standing Glute Med Mobilization with Small Ball on Wall  - 1 x daily - 7 x weekly - 3 sets - 1-2 min  hold  ASSESSMENT:  CLINICAL IMPRESSION: Patient arrived today with reports that she was not feeling much of a change.  Upon further conversation realized that she is not complaining of any hip or knee pain and pain is not necessarily activity related.  She does not experience any change from stretches exercises or soft tissue massage.  She has had fibromyalgia flares in the past and we discussed the use of aquatic therapy.  Patient reports she had a poor experience with the water in the past and would really prefer not to get into the water.  In an effort to try something different, we discussed use of meditation for focused control of pain signals and she will try doing exercises and soft tissue massage in her bathtub as research shows that the input of the water can be very beneficial for fibromyalgia and high pain levels.  Will follow-up next week to determine if any changes have occurred and move forward as appropriate from that point.  Eval: Patient is a 77 year old female with  bilateral hip pain and left knee pain. She has limitations in strength and general right LE stability. She has tenderness to palpation in bilateral gluteals and in her medial knee. She has had a positive reaction to TPDN in the past. She would benefit from skilled therapy to improve strength, reduce trigger points and improve functional mobility.  OBJECTIVE IMPAIRMENTS: Abnormal gait, decreased activity tolerance, decreased endurance, decreased mobility, difficulty walking,  decreased ROM, decreased strength, increased fascial restrictions, increased muscle spasms, and pain.   ACTIVITY LIMITATIONS: carrying, lifting, standing, squatting, sleeping, stairs, transfers, and locomotion level  PARTICIPATION LIMITATIONS: meal prep, cleaning, driving, shopping, community activity, and yard work  PERSONAL FACTORS: 1-2 comorbidities: knee replacement  are also affecting patient's functional outcome.   REHAB POTENTIAL: Good  CLINICAL DECISION MAKING: Evolving/moderate complexityincreasing pain with decreasing ability to ambualate   EVALUATION COMPLEXITY: Moderate   GOALS: Goals reviewed with patient? Yes  SHORT TERM GOALS: Target date: 05/28/2023   Patient will report a 50% reduction in tenderness to palpation and trigger points in the glutes Baseline: Goal status: INITIAL  2.  Patient will increase gross bilateral lower extremity strength by 5 pounds Baseline:  Goal status: INITIAL  3.  Patient will be independent with basic HEP Baseline:  Goal status: INITIAL LONG TERM GOALS: Target date: 06/25/2023    Patient will ambulate community distances without increased pain Baseline:  Goal status: INITIAL  2.  Patient will stand for 45 minutes without increased pain in order to perform daily tasks Baseline:  Goal status: INITIAL  3.  Patient will have complete stretching and strengthening program Baseline:  Goal status: INITIAL    PLAN:  PT FREQUENCY: 2x/week  PT DURATION: 8 weeks  PLANNED INTERVENTIONS: Therapeutic exercises, Therapeutic activity, Neuromuscular re-education, Balance training, Gait training, Patient/Family education, Self Care, Joint mobilization, Stair training, Orthotic/Fit training, Aquatic Therapy, Dry Needling, Taping, Ultrasound, and Manual therapy  PLAN FOR NEXT SESSION: Begin with trigger point dry needling and manual therapy to bilateral hips.  Review HEP.  Consider straight leg raise, quad set, short arc quad for knee  stability.  Consider slow march with bilateral legs with cueing to reduce lateral movement.  Progress exercises as tolerated consider clamshell in her lateral clam.  Referring diagnosis?  M79.7 (ICD-10-CM) - Fibromyalgia  M70.61,M70.62 (ICD-10-CM) - Trochanteric bursitis of both hips  M76.32 (ICD-10-CM) - It band syndrome, left  M76.31 (ICD-10-CM) - It band syndrome, right   Treatment diagnosis? (if different than referring diagnosis)  What was this (referring dx) caused by? []  Surgery []  Fall [x]  Ongoing issue []  Arthritis []  Other: ____________  Laterality: []  Rt []  Lt []  Both  Check all possible CPT codes:  *CHOOSE 10 OR LESS*    []  97110 (Therapeutic Exercise)  []  92507 (SLP Treatment)  []  97112 (Neuro Re-ed)   []  92526 (Swallowing Treatment)   []  97116 (Gait Training)   []  [id number] (Cognitive Training, 1st 15 minutes) []  97140 (Manual Therapy)   []  97130 (Cognitive Training, each add'l 15 minutes)  []  97164 (Re-evaluation)                              []  Other, List CPT Code ____________  []  97530 (Therapeutic Activities)     []  97535 (Self Care)   [x]  All codes above (97110 - 97535)  []  97012 (Mechanical Traction)  []  97014 (E-stim Unattended)  []  97032 (E-stim manual)  []  97033 (Ionto)  [x]  97035 (Ultrasound) []   16109 (Physical Performance Training) [x]  [id number] (Aquatic Therapy) []  97016 (Vasopneumatic Device) []  [id number] (Paraffin) []  97034 (Contrast Bath) []  [id number] (Wound Care 1st 20 sq cm) []  97598 (Wound Care each add'l 20 sq cm) []  97760 (Orthotic Fabrication, Fitting, Training Initial) []  [id number] (Prosthetic Management and Training Initial) []  [id number] (Orthotic or Prosthetic Training/ Modification Subsequent)  Jarielys Girardot C. Kazden Largo PT, DPT 05/28/23 12:54 PM

## 2023-05-31 ENCOUNTER — Encounter (HOSPITAL_BASED_OUTPATIENT_CLINIC_OR_DEPARTMENT_OTHER): Payer: Medicare PPO | Admitting: Physical Therapy

## 2023-06-03 ENCOUNTER — Encounter (HOSPITAL_BASED_OUTPATIENT_CLINIC_OR_DEPARTMENT_OTHER): Payer: Self-pay | Admitting: Physical Therapy

## 2023-06-03 ENCOUNTER — Ambulatory Visit (HOSPITAL_BASED_OUTPATIENT_CLINIC_OR_DEPARTMENT_OTHER): Payer: Medicare PPO | Admitting: Physical Therapy

## 2023-06-03 DIAGNOSIS — R2689 Other abnormalities of gait and mobility: Secondary | ICD-10-CM

## 2023-06-03 DIAGNOSIS — M62830 Muscle spasm of back: Secondary | ICD-10-CM | POA: Diagnosis not present

## 2023-06-03 DIAGNOSIS — M25551 Pain in right hip: Secondary | ICD-10-CM

## 2023-06-03 DIAGNOSIS — M25552 Pain in left hip: Secondary | ICD-10-CM | POA: Diagnosis not present

## 2023-06-03 NOTE — Therapy (Signed)
OUTPATIENT PHYSICAL THERAPY LOWER EXTREMITY DISCHARGE   Patient Name: Susan Carpenter MRN: [id number] DOB:Nov 15, 1945, 77 y.o., female Today's Date: 06/03/2023  END OF SESSION:  PT End of Session - 06/03/23 1346     Visit Number 5    Number of Visits 12    Date for PT Re-Evaluation 06/10/23    PT Start Time 1345    PT Stop Time 1401    PT Time Calculation (min) 16 min    Activity Tolerance Patient tolerated treatment well    Behavior During Therapy WFL for tasks assessed/performed              Past Medical History:  Diagnosis Date   Allergy    rhinitis   ANA positive    Arthritis    Complication of anesthesia    Constipation    Fibromyalgia    Frontal fibrosing alopecia    Dr. Corky Downs    GERD (gastroesophageal reflux disease)    Heart murmur    Helicobacter pylori (H. pylori) infection    s/p treatment   Hiatal hernia    Interstitial cystitis    Pain in joint    site unspecified   PONV (postoperative nausea and vomiting)    Psoriasis    Psoriatic arthritis (HCC)    Past Surgical History:  Procedure Laterality Date   BLADDER SURGERY     x2 for scar tissue   CHOLECYSTECTOMY  1995   cysts on ovaries     polyp removal  1984   from fallopian tube   ROTATOR CUFF REPAIR Left 06/2017   THUMB SURGERY Right 01/16/2016   TOTAL KNEE ARTHROPLASTY Right 08/01/2015   Procedure: RIGHT TOTAL KNEE ARTHROPLASTY;  Surgeon: Ollen Gross, MD;  Location: [facility];  Service: Orthopedics;  Laterality: Right;   TUBAL LIGATION     Patient Active Problem List   Diagnosis Date Noted   Status post left rotator cuff repair 10/01/2018   History of total knee replacement, right 10/01/2018   DDD (degenerative disc disease), cervical 10/01/2018   DDD (degenerative disc disease), lumbar 10/01/2018   Cough variant asthma vs uacs 02/02/2018   Chronic cough 12/20/2017   Other insomnia 01/15/2017   Other fatigue 01/15/2017   Trapezius muscle spasm 01/15/2017   Alopecia, scarring  09/21/2016   Primary osteoarthritis of both hands 09/19/2016   OA (osteoarthritis) of knee 08/01/2015   INTERSTITIAL CYSTITIS 01/31/2010   Fibromyalgia 01/31/2010   FATIGUE 01/31/2010   DYSPNEA/SHORTNESS OF BREATH 01/31/2010   FREQUENCY, URINARY 01/31/2010   Depression 01/24/2009   Migraines 09/04/2008   HELICOBACTER PYLORI INFECTION 10/14/2007   Allergic rhinitis 10/14/2007   HIATAL HERNIA 10/14/2007   ANA POSITIVE 10/14/2007   Hypothyroidism 10/10/2007   PAIN IN JOINT, SITE UNSPECIFIED 10/10/2007   WEIGHT GAIN 10/10/2007    PCP: Dr Selena Batten  REFERRING PROVIDER: Dr Sherron Ales   REFERRING DIAG:  Diagnosis  M79.7 (ICD-10-CM) - Fibromyalgia  M70.61,M70.62 (ICD-10-CM) - Trochanteric bursitis of both hips  M76.32 (ICD-10-CM) - It band syndrome, left  M76.31 (ICD-10-CM) - It band syndrome, right    THERAPY DIAG:  Pain in right hip  Pain in left hip  Other abnormalities of gait and mobility  Rationale for Evaluation and Treatment: Rehabilitation  ONSET DATE:   SUBJECTIVE:   SUBJECTIVE STATEMENT: I stopped taking my statins which I think is helpful. The water was helpful for short periods of time. I also stopped doing collagen powder. I am sleeping better. I am receiving all of the  deliveries for our kitchen remodel and I hurt my back- about 4/10.    PERTINENT HISTORY: Arthritis in both ankles, knees, and hands  PAIN:  Are you having pain? Yes: NPRS scale: 1-2/10 Pain location: hips/lateral thighs Pain description: aching  Aggravating factors: standing and walking  Relieving factors: rest   PRECAUTIONS: None  WEIGHT BEARING RESTRICTIONS: No  FALLS:  Has patient fallen in last 6 months? Yes. Number of falls 2 weeks ago: moving a dog crate and she tripped  LIVING ENVIRONMENT: 4 steps from the garage to house. Has a second floor but doesn't do them daily  OCCUPATION:  Retired:   Hobbies:   Walking Reading   PLOF: Independent  PATIENT GOALS:    To have less pain   NEXT MD VISIT:  Nothing at this time   OBJECTIVE:    PATIENT SURVEYS:  FOTO    COGNITION: Overall cognitive status: Within functional limits for tasks assessed     SENSATION: Right hand numbness at times    LOWER EXTREMITY ROM:  Passive ROM Right eval Left eval  Hip flexion WNL WNL  Hip extension    Hip abduction    Hip adduction    Hip internal rotation Mild pain  Mild pain   Hip external rotation    Knee flexion    Knee extension    Ankle dorsiflexion    Ankle plantarflexion    Ankle inversion    Ankle eversion     (Blank rows = not tested)  LOWER EXTREMITY MMT:  MMT Right eval Left eval  Hip flexion 23.8 20.6  Hip extension    Hip abduction 15.8 23.8  Hip adduction    Hip internal rotation    Hip external rotation    Knee flexion    Knee extension 18.1 21.1  Ankle dorsiflexion    Ankle plantarflexion    Ankle inversion    Ankle eversion     (Blank rows = not tested)   GAIT: Lateral movement from right towards left.   TODAY'S TREATMENT:                                                                                                                              DATE:    Treatment                            7/29:  Trigger Point Dry Needling, Manual Therapy Treatment:  Initial or subsequent education regarding Trigger Point Dry Needling: Subsequent Did patient give consent to treatment with Trigger Point Dry Needling: Yes TPDN with skilled palpation and monitoring followed by STM to the following muscles: Lt glut med  Discussed importance of continued stretching, exercising, desensitization   7/15 Trigger Point Dry-Needling  Treatment instructions: Expect mild to moderate muscle soreness. S/S of pneumothorax if dry needled over a lung field, and to seek immediate medical attention should they occur. Patient verbalized understanding  of these instructions and education.  Patient Consent Given: Yes Education handout  provided: Yes Muscles treated: right gluteal 3x with a .30x50 needle  Electrical stimulation performed: No Parameters: N/A Treatment response/outcome: Great toe with twitch response.  Manual therapy: Trigger point release to gluteal, trigger point release to lower back, skilled palpation of trigger points  Standing:  March x10 each leg Standing hip abduction x10 each leg  Stading hip extension x10   7/10  Trigger Point Dry-Needling  Treatment instructions: Expect mild to moderate muscle soreness. S/S of pneumothorax if dry needled over a lung field, and to seek immediate medical attention should they occur. Patient verbalized understanding of these instructions and education.  Patient Consent Given: Yes Education handout provided: Yes Muscles treated: right gluteal 3x with a .30x50 needle  Electrical stimulation performed: No Parameters: N/A Treatment response/outcome: Great toe with twitch response.  Manual therapy: Trigger point release to gluteal, trigger point release to lower back, skilled palpation of trigger points  Reviewed and updated HEP  Supine march 2 x 10 Supine hip abduction 2 x 10 red Bilateral straight leg raise 2 x 10 each leg  All exercises were done in conjunction with TA breathing and and pain-free motions.   PATIENT EDUCATION:  Education details: HEP, symptom management,  Person educated: Patient Education method: Explanation, Demonstration, Tactile cues, Verbal cues, and Handouts Education comprehension: verbalized understanding, returned demonstration, verbal cues required, tactile cues required, and needs further education  HOME EXERCISE PROGRAM: Access Code: [email or web address] URL: [email or web address] Date: 04/29/2023 Prepared by: Lorayne Bender  Exercises - Supine Lower Trunk Rotation  - 1 x daily - 7 x weekly - 3 sets - 10 reps - Supine Piriformis Stretch with Foot on Ground  - 1 x daily - 7 x weekly - 3 sets - 3 reps - 20sec hold -  Theracane Over Shoulder  - 1 x daily - 7 x weekly - 3 sets - 1-2 min  hold - Standing Glute Med Mobilization with Small Ball on Wall  - 1 x daily - 7 x weekly - 3 sets - 1-2 min  hold  ASSESSMENT:  CLINICAL IMPRESSION: Pt states she does not feel that PT is directly affecting her issues at this point and would like to d/c. She will continue to address pain levels from a medication standpoint and I encouraged her to reach out to her cardiologist regarding statin dosage. Will continue desensitization with water. At this point the combination of everything has significantly reduced her pain levels and she continues to walk her dog and remains independent. Encouraged her to reach out to me with any further questions.   Eval: Patient is a 77 year old female with bilateral hip pain and left knee pain. She has limitations in strength and general right LE stability. She has tenderness to palpation in bilateral gluteals and in her medial knee. She has had a positive reaction to TPDN in the past. She would benefit from skilled therapy to improve strength, reduce trigger points and improve functional mobility.  OBJECTIVE IMPAIRMENTS: Abnormal gait, decreased activity tolerance, decreased endurance, decreased mobility, difficulty walking, decreased ROM, decreased strength, increased fascial restrictions, increased muscle spasms, and pain.   ACTIVITY LIMITATIONS: carrying, lifting, standing, squatting, sleeping, stairs, transfers, and locomotion level  PARTICIPATION LIMITATIONS: meal prep, cleaning, driving, shopping, community activity, and yard work  PERSONAL FACTORS: 1-2 comorbidities: knee replacement  are also affecting patient's functional outcome.   REHAB POTENTIAL: Good  CLINICAL DECISION MAKING: Evolving/moderate complexityincreasing pain with decreasing  ability to ambualate   EVALUATION COMPLEXITY: Moderate   GOALS: Goals reviewed with patient? Yes  SHORT TERM GOALS: Target date:  05/28/2023   Patient will report a 50% reduction in tenderness to palpation and trigger points in the glutes Baseline: Goal status: INITIAL  2.  Patient will increase gross bilateral lower extremity strength by 5 pounds Baseline:  Goal status: INITIAL  3.  Patient will be independent with basic HEP Baseline:  Goal status: INITIAL LONG TERM GOALS: Target date: 06/25/2023    Patient will ambulate community distances without increased pain Baseline:  Goal status: not met  2.  Patient will stand for 45 minutes without increased pain in order to perform daily tasks Baseline:  Goal status: not met  3.  Patient will have complete stretching and strengthening program Baseline:  Goal status: achieved    PLAN:  PT FREQUENCY: 2x/week  PT DURATION: 8 weeks  PLANNED INTERVENTIONS: Therapeutic exercises, Therapeutic activity, Neuromuscular re-education, Balance training, Gait training, Patient/Family education, Self Care, Joint mobilization, Stair training, Orthotic/Fit training, Aquatic Therapy, Dry Needling, Taping, Ultrasound, and Manual therapy    Referring diagnosis?  M79.7 (ICD-10-CM) - Fibromyalgia  M70.61,M70.62 (ICD-10-CM) - Trochanteric bursitis of both hips  M76.32 (ICD-10-CM) - It band syndrome, left  M76.31 (ICD-10-CM) - It band syndrome, right   Treatment diagnosis? (if different than referring diagnosis)  What was this (referring dx) caused by? []  Surgery []  Fall [x]  Ongoing issue []  Arthritis []  Other: ____________  Laterality: []  Rt []  Lt []  Both  Check all possible CPT codes:  *CHOOSE 10 OR LESS*    []  97110 (Therapeutic Exercise)  []  40981 (SLP Treatment)  []  97112 (Neuro Re-ed)   []  92526 (Swallowing Treatment)   []  97116 (Gait Training)   []  [id number] (Cognitive Training, 1st 15 minutes) []  97140 (Manual Therapy)   []  97130 (Cognitive Training, each add'l 15 minutes)  []  97164 (Re-evaluation)                              []  Other, List CPT Code  ____________  []  97530 (Therapeutic Activities)     []  97535 (Self Care)   [x]  All codes above (97110 - 97535)  []  97012 (Mechanical Traction)  []  97014 (E-stim Unattended)  []  97032 (E-stim manual)  []  97033 (Ionto)  [x]  97035 (Ultrasound) []  97750 (Physical Performance Training) [x]  [id number] (Aquatic Therapy) []  97016 (Vasopneumatic Device) []  [id number] (Paraffin) []  97034 (Contrast Bath) []  97597 (Wound Care 1st 20 sq cm) []  97598 (Wound Care each add'l 20 sq cm) []  97760 (Orthotic Fabrication, Fitting, Training Initial) []  [id number] (Prosthetic Management and Training Initial) []  [id number] (Orthotic or Prosthetic Training/ Modification Subsequent)  Darrin Koman C. Kellar Westberg PT, DPT 06/03/23 2:09 PM

## 2023-06-04 DIAGNOSIS — L661 Lichen planopilaris: Secondary | ICD-10-CM | POA: Diagnosis not present

## 2023-06-04 DIAGNOSIS — Z79899 Other long term (current) drug therapy: Secondary | ICD-10-CM | POA: Diagnosis not present

## 2023-06-05 ENCOUNTER — Encounter (HOSPITAL_BASED_OUTPATIENT_CLINIC_OR_DEPARTMENT_OTHER): Payer: Medicare PPO | Admitting: Physical Therapy

## 2023-06-10 DIAGNOSIS — L669 Cicatricial alopecia, unspecified: Secondary | ICD-10-CM | POA: Diagnosis not present

## 2023-06-10 DIAGNOSIS — L661 Lichen planopilaris: Secondary | ICD-10-CM | POA: Diagnosis not present

## 2023-06-10 DIAGNOSIS — Z79899 Other long term (current) drug therapy: Secondary | ICD-10-CM | POA: Diagnosis not present

## 2023-07-03 NOTE — Progress Notes (Unsigned)
Office Visit Note  Patient: Susan Carpenter             Date of Birth: 1946-08-21           MRN: [id number]             PCP: Gweneth Dimitri, MD Referring: Gweneth Dimitri, MD Visit Date: 07/04/2023 Occupation: @GUAROCC @  Subjective:  Left hip pain  History of Present Illness: Susan Carpenter is a 77 y.o. female with history of sjogren's syndrome.   Patient remains on plaquenil 200 mg 1 tablet by mouth daily and methotrexate 3 tablets once weekly prescribed by dermatology, Dr. Corky Downs at [facility].  Patient is under the care of Dr. Constance Goltz for management of frontal fibrosing alopecia. Patient presents today with left hip pain.  Her symptoms initially started 3 weeks ago while walking.  Patient states that for the past 1 week she has been unable to do her walking regimen due to the discomfort.  She has not followed up with her orthopedist at [facility] for further evaluation yet.  Patient is having an aching sensation in both thighs and has had pain at night.  She has been taking melatonin, Aleve, and Robaxin at bedtime. Overall her sicca symptoms have been tolerable.  She has not needed to use any over-the-counter products for mouth dryness.  She is been try to drink fluids throughout the day.  She denies any parotid swelling or tenderness.  She denies any cervical lymphadenopathy.  She continues to have a prescription for Restasis for dry eyes.   Activities of Daily Living:  Patient reports morning stiffness for 10 minutes.   Patient Reports nocturnal pain.  Difficulty dressing/grooming: Denies Difficulty climbing stairs: Reports Difficulty getting out of chair: Reports Difficulty using hands for taps, buttons, cutlery, and/or writing: Reports  Review of Systems  Constitutional:  Positive for fatigue.  HENT:  Positive for mouth dryness. Negative for mouth sores.   Eyes:  Positive for dryness.  Respiratory:  Negative for shortness of breath.   Cardiovascular:  Negative for chest pain  and palpitations.  Gastrointestinal:  Negative for blood in stool, constipation and diarrhea.  Endocrine: Positive for increased urination.  Genitourinary:  Positive for involuntary urination.  Musculoskeletal:  Positive for joint pain, gait problem, joint pain, myalgias, morning stiffness, muscle tenderness and myalgias. Negative for joint swelling and muscle weakness.  Skin:  Positive for hair loss and sensitivity to sunlight. Negative for color change and rash.  Allergic/Immunologic: Negative for susceptible to infections.  Neurological:  Positive for headaches. Negative for dizziness.  Hematological:  Negative for swollen glands.  Psychiatric/Behavioral:  Positive for depressed mood and sleep disturbance. The patient is not nervous/anxious.     PMFS History:  Patient Active Problem List   Diagnosis Date Noted  . Status post left rotator cuff repair 10/01/2018  . History of total knee replacement, right 10/01/2018  . DDD (degenerative disc disease), cervical 10/01/2018  . DDD (degenerative disc disease), lumbar 10/01/2018  . Cough variant asthma vs uacs 02/02/2018  . Chronic cough 12/20/2017  . Other insomnia 01/15/2017  . Other fatigue 01/15/2017  . Trapezius muscle spasm 01/15/2017  . 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 Primary osteoarthritis of both hands 09/19/2016  . OA (osteoarthritis) of knee 08/01/2015  . INTERSTITIAL CYSTITIS 01/31/2010  . Fibromyalgia 01/31/2010  . FATIGUE 01/31/2010  . DYSPNEA/SHORTNESS OF BREATH 01/31/2010  . FREQUENCY, URINARY 01/31/2010  . Depression 01/24/2009  . Migraines 09/04/2008  . HELICOBACTER PYLORI INFECTION 10/14/2007  .  Allergic rhinitis 10/14/2007  . HIATAL HERNIA 10/14/2007  . ANA POSITIVE 10/14/2007  . Hypothyroidism 10/10/2007  . PAIN IN JOINT, SITE UNSPECIFIED 10/10/2007  . WEIGHT GAIN 10/10/2007    Past Medical History:  Diagnosis Date  . Allergy    rhinitis  . ANA positive   . Arthritis   . Complication of anesthesia    . Constipation   . Fibromyalgia   . Frontal fibrosing alopecia    Dr. Corky Downs   . GERD (gastroesophageal reflux disease)   . Heart murmur   . Helicobacter pylori (H. pylori) infection    s/p treatment  . Hiatal hernia   . Interstitial cystitis   . Pain in joint    site unspecified  . PONV (postoperative nausea and vomiting)   . Psoriasis   . Psoriatic arthritis (HCC)   . Sjogren's syndrome (HCC)     Family History  Problem Relation Age of Onset  . Diabetes Father   . Heart disease Father        MI  . Alcohol abuse Father   . Cancer Mother        COLON   . Mental illness Mother        ALZHEIMERS; DEPRESSION  . Thyroid disease Mother        HYPOTHYROID  . Alcohol abuse Maternal Uncle   . Diabetes Maternal Uncle   . Hypertension Maternal Grandmother   . Stroke Maternal Grandmother    Past Surgical History:  Procedure Laterality Date  . BLADDER SURGERY     x2 for scar tissue  . CHOLECYSTECTOMY  1995  . cysts on ovaries    . polyp removal  1984   from fallopian tube  . ROTATOR CUFF REPAIR Left 06/2017  . THUMB SURGERY Right 01/16/2016  . TOTAL KNEE ARTHROPLASTY Right 08/01/2015   Procedure: RIGHT TOTAL KNEE ARTHROPLASTY;  Surgeon: Ollen Gross, MD;  Location: [facility];  Service: Orthopedics;  Laterality: Right;  . TUBAL LIGATION     Social History   Social History Narrative  . Not on file   Immunization History  Administered Date(s) Administered  . Influenza Whole 08/05/2004  . Influenza, High Dose Seasonal PF 08/04/2018  . Influenza,inj,Quad PF,6+ Mos 11/22/2021  . Influenza-Unspecified 08/05/2013  . PFIZER(Purple Top)SARS-COV-2 Vaccination 12/04/2019, 12/26/2019, 08/22/2020  . Td 07/06/2004     Objective: Vital Signs: BP 110/71 (BP Location: Left Arm, Patient Position: Sitting, Cuff Size: Normal)   Pulse 86   Resp 16   Ht 5\' 4"  (1.626 m)   Wt 176 lb (79.8 kg)   BMI 30.21 kg/m    Physical Exam Vitals and nursing note reviewed.  Constitutional:       Appearance: She is well-developed.  HENT:     Head: Normocephalic and atraumatic.  Eyes:     Conjunctiva/sclera: Conjunctivae normal.  Cardiovascular:     Rate and Rhythm: Normal rate and regular rhythm.     Heart sounds: Normal heart sounds.  Pulmonary:     Effort: Pulmonary effort is normal.     Breath sounds: Normal breath sounds.  Abdominal:     General: Bowel sounds are normal.     Palpations: Abdomen is soft.  Musculoskeletal:     Cervical back: Normal range of motion.  Lymphadenopathy:     Cervical: No cervical adenopathy.  Skin:    General: Skin is warm and dry.     Capillary Refill: Capillary refill takes less than 2 seconds.  Neurological:     Mental  Status: She is alert and oriented to person, place, and time.  Psychiatric:        Behavior: Behavior normal.     Musculoskeletal Exam: Generalized hyperalgesia and positive tender points.  C-spine has limited ROM with lateral rotation.  Tenderness of the left SI joint.  Painful ROM of the left hip.  Tenderness over both trochanteric bursa.  Right shoulder has good ROM.  Left shoulder has limited abduction.  Elbow joints and wrist joints have good ROM.  PIP and DIP thickening consistent with osteoarthritis of both hands.  Left CMC joint thickening.   Right knee replacement has good ROM.  Left knee has good ROM with no warmth or effusion.  Ankle joints have good ROM with no tenderness or joint swelling.   CDAI Exam: CDAI Score: -- Patient Global: --; Provider Global: -- Swollen: --; Tender: -- Joint Exam 07/04/2023   No joint exam has been documented for this visit   There is currently no information documented on the homunculus. Go to the Rheumatology activity and complete the homunculus joint exam.  Investigation: No additional findings.  Imaging: XR HIP UNILAT W OR W/O PELVIS 2-3 VIEWS LEFT  Result Date: 07/04/2023 No hip joint narrowing was noted.  Spurring over greater trochanter was noted.  No  chondrocalcinosis was noted.  Degenerative changes were noted in the lumbar spine. Impression: Unremarkable x-rays of the hip joint.  XR Pelvis 1-2 Views  Result Date: 07/04/2023 No SI joint sclerosis or narrowing was noted.  Degenerative changes were noted in the lumbar spine. Impression: Unremarkable x-rays of the SI joints.   Recent Labs: Lab Results  Component Value Date   WBC 9.7 07/12/2020   HGB 13.7 07/12/2020   PLT 219 07/12/2020   NA 138 07/12/2020   K 3.9 07/12/2020   CL 102 07/12/2020   CO2 26 07/12/2020   GLUCOSE 90 07/12/2020   BUN 16 07/12/2020   CREATININE 0.91 07/12/2020   BILITOT 0.4 07/12/2020   ALKPHOS 82 01/15/2017   AST 17 07/12/2020   ALT 18 07/12/2020   PROT 6.5 07/12/2020   ALBUMIN 4.0 01/15/2017   CALCIUM 9.2 07/12/2020   GFRAA 73 07/12/2020    Speciality Comments: PLQ EYE EXAM 12/09/2020 [facility]  Procedures:  No procedures performed Allergies: Cephalexin, Oyster shell, Penicillins, Shellfish allergy, Codeine, Doxycycline, and Erythromycin   Assessment / Plan:     Visit Diagnoses: Sjogren's syndrome with other organ involvement (HCC) - ANA 1: 40 speckled, Ro negative, La negative, RF negative: Patient remains on Plaquenil 200 mg 1 tablet by mouth daily and methotrexate 3 tablets once weekly prescribed by Dr. Constance Goltz her dermatologist for management of fibrosing alopecia. Overall her sicca symptoms have been tolerable.  She has been drinking water throughout the day to help with mouth dryness and has not needed over-the-counter products.  She continues to use Restasis as needed for dry eyes.  She has not had any parotid swelling or tenderness.  No cervical lymphadenopathy. Discussed the increased risk for developing lymphoma in patients with Sjogren's syndrome.  Plan to obtain the following lab work today for further evaluation.  She was advised to notify us if she develops any new or worsening symptoms.  She will follow-up in the  office in 5 to 6 months or sooner if needed.  - Plan: CBC with Differential/Platelet, COMPLETE METABOLIC PANEL WITH GFR, Protein / creatinine ratio, urine, ANA, C3 and C4, Sedimentation rate, Sjogrens syndrome-A extractable nuclear antibody, Sjogrens syndrome-B extractable nuclear antibody, Rheumatoid  factor, Serum protein electrophoresis with reflex, C-reactive protein  High risk medication use - Plaquenil 200 mg 1 tablet by mouth daily and methotrexate 3 tablets once weekly-Prescribed by dermatology-Dr. Corky Downs. CBC and CMP updated today.  Discussed the importance of yearly plaquenil eye examinations.  No recent Plaquenil examination on file.- Plan: CBC with Differential/Platelet, COMPLETE METABOLIC PANEL WITH GFR  Fibromyalgia: Patient has generalized hyperalgesia and positive tender points on examination.  She has been experiencing increased myalgias especially in both legs.  Patient typically tries to walk on a daily basis for exercise but has been unable to do so for the past 1 week.  She has been taking melatonin, Aleve, and methocarbamol at bedtime to help alleviate nocturnal pain to allow her to sleep better at night.  Patient has requested a refill of methocarbamol to be sent to the pharmacy today.  She has had to take methocarbamol more frequently due to the severity of pain she is been experiencing in her left hip.  Patient requested a prescription for Flexeril but I discouraged the use of Flexeril over the age of 61 especially long-term.  Other fatigue: Chronic.  Fatigue has been one of her primary concerns.  Plan on obtaining updated autoimmune lab work today.  Other insomnia: Patient takes melatonin, Robaxin, and Aleve at bedtime to help alleviate nocturnal pain to allow her to sleep better at night.  Trochanteric bursitis of both hips: She has tenderness palpation over bilateral trochanteric bursa.  Patient had a right trochanteric bursa cortisone injection performed on 03/25/2023.  She  presents today with increased pain in the left hip especially in her groin.  No recent injury or fall.  Her symptoms initially started about 3 weeks ago and had progressively been worsening to the point that she decided against walking this week for exercise.  She has not been performing stretching exercises recently.  X-rays of the left hip and pelvis were obtained today for further evaluation.  Primary osteoarthritis of both hands - HLA-B27 was negative in the past: Left CMC joint thickening.  PIP and DIP thickening.  No synovitis or dactylitis noted.  Status post left rotator cuff repair: Under care of Dr. Thomasena Edis.  Patient is not ready to proceed with a left shoulder replacement at this time.  Limited abduction and internal rotation noted on examination today.  Pain in left hip -Patient presents today with left hip pain x 3 weeks.  No recent injury or fall.  X-rays of the left hip updated today- spurring over greater trochanter was  noted.  No chondrocalcinosis was noted.  Degenerative changes were noted  in the lumbar spine.  Discussed that her pain could be referred or radiating from her lower back.  Dr. Corliss Skains and I both recommended following up with a spine specialist at [facility] where she is already a patient for further evaluation.   Plan to also check ESR and CRP today.    Plan: XR Pelvis 1-2 Views, XR HIP UNILAT W OR W/O PELVIS 2-3 VIEWS LEFT  Chronic left SI joint pain -HLA-B27 was negative in the past: Patient has tenderness over the left SI joint on examination today.  X-rays of the pelvis updated today-x-rays of the sacroiliac joints were unremarkable.  She does have degenerative change in the lumbar spine which could be contributing to her symptoms.  Reviewed images with Dr. Corliss Skains and reviewed images with the patient.  Recommended following up at [facility] where she is already a patient for further evaluation and management.  Patient asked what she could do for her  pain level currently but has already been taking Aleve and methocarbamol.  She does not find Tylenol to be as effective as Aleve.  Discussed that we do not prescribe narcotics.  A refill of methocarbamol was sent to the pharmacy today.  Discussed a prednisone taper which she plans on following up at [facility] for further evaluation. Plan: XR Pelvis 1-2 Views  History of total knee replacement, right - Dr. Jana Half well.  Good ROM with no discomfort.  No warmth or effusion.   Primary osteoarthritis of both feet: PIP and DIP thickening noted.  Tailor bunions noted bilaterally.  No tenderness or synovitis of ankle joints noted.  DDD (degenerative disc disease), cervical: C-spine has limited ROM with lateral rotation.   DDD (degenerative disc disease), lumbar: Patient was encouraged to follow-up at [facility] for further evaluation.  Other medical conditions are listed as follows:   History of gastroesophageal reflux (GERD)  History of hypothyroidism  History of depression  Interstitial cystitis   Orders: Orders Placed This Encounter  Procedures  . XR Pelvis 1-2 Views  . XR HIP UNILAT W OR W/O PELVIS 2-3 VIEWS LEFT  . CBC with Differential/Platelet  . COMPLETE METABOLIC PANEL WITH GFR  . Protein / creatinine ratio, urine  . ANA  . C3 and C4  . Sedimentation rate  . Sjogrens syndrome-A extractable nuclear antibody  . Sjogrens syndrome-B extractable nuclear antibody  . Rheumatoid factor  . Serum protein electrophoresis with reflex  . C-reactive protein   Meds ordered this encounter  Medications  . methocarbamol (ROBAXIN) 500 MG tablet    Sig: TAKE 1 TABLET BY MOUTH DAILY AS NEEDED FOR MUSCLE SPASM    Dispense:  30 tablet    Refill:  0    Follow-Up Instructions: Return in 5 months (on 12/04/2023) for Sjogren's syndrome.   Gearldine Bienenstock, PA-C  Note - This record has been created using Dragon software.  Chart creation errors have been sought, but may  not always  have been located. Such creation errors do not reflect on  the standard of medical care.

## 2023-07-04 ENCOUNTER — Encounter: Payer: Self-pay | Admitting: Physician Assistant

## 2023-07-04 ENCOUNTER — Ambulatory Visit: Payer: Medicare PPO | Attending: Physician Assistant

## 2023-07-04 ENCOUNTER — Ambulatory Visit (INDEPENDENT_AMBULATORY_CARE_PROVIDER_SITE_OTHER): Payer: Medicare PPO

## 2023-07-04 ENCOUNTER — Ambulatory Visit: Payer: Medicare PPO | Attending: Physician Assistant | Admitting: Physician Assistant

## 2023-07-04 VITALS — BP 110/71 | HR 86 | Resp 16 | Ht 64.0 in | Wt 176.0 lb

## 2023-07-04 DIAGNOSIS — Z96651 Presence of right artificial knee joint: Secondary | ICD-10-CM | POA: Insufficient documentation

## 2023-07-04 DIAGNOSIS — N3010 Interstitial cystitis (chronic) without hematuria: Secondary | ICD-10-CM | POA: Insufficient documentation

## 2023-07-04 DIAGNOSIS — R5383 Other fatigue: Secondary | ICD-10-CM

## 2023-07-04 DIAGNOSIS — M19041 Primary osteoarthritis, right hand: Secondary | ICD-10-CM

## 2023-07-04 DIAGNOSIS — M5136 Other intervertebral disc degeneration, lumbar region: Secondary | ICD-10-CM

## 2023-07-04 DIAGNOSIS — Z8719 Personal history of other diseases of the digestive system: Secondary | ICD-10-CM

## 2023-07-04 DIAGNOSIS — Z8659 Personal history of other mental and behavioral disorders: Secondary | ICD-10-CM | POA: Insufficient documentation

## 2023-07-04 DIAGNOSIS — M19072 Primary osteoarthritis, left ankle and foot: Secondary | ICD-10-CM | POA: Insufficient documentation

## 2023-07-04 DIAGNOSIS — M25552 Pain in left hip: Secondary | ICD-10-CM | POA: Diagnosis not present

## 2023-07-04 DIAGNOSIS — Z79899 Other long term (current) drug therapy: Secondary | ICD-10-CM | POA: Diagnosis not present

## 2023-07-04 DIAGNOSIS — Z8639 Personal history of other endocrine, nutritional and metabolic disease: Secondary | ICD-10-CM | POA: Insufficient documentation

## 2023-07-04 DIAGNOSIS — G8929 Other chronic pain: Secondary | ICD-10-CM | POA: Diagnosis not present

## 2023-07-04 DIAGNOSIS — M5030 Other cervical disc degeneration, unspecified cervical region: Secondary | ICD-10-CM | POA: Insufficient documentation

## 2023-07-04 DIAGNOSIS — M19042 Primary osteoarthritis, left hand: Secondary | ICD-10-CM | POA: Insufficient documentation

## 2023-07-04 DIAGNOSIS — M797 Fibromyalgia: Secondary | ICD-10-CM | POA: Insufficient documentation

## 2023-07-04 DIAGNOSIS — G4709 Other insomnia: Secondary | ICD-10-CM | POA: Diagnosis not present

## 2023-07-04 DIAGNOSIS — M19071 Primary osteoarthritis, right ankle and foot: Secondary | ICD-10-CM | POA: Insufficient documentation

## 2023-07-04 DIAGNOSIS — M533 Sacrococcygeal disorders, not elsewhere classified: Secondary | ICD-10-CM | POA: Diagnosis not present

## 2023-07-04 DIAGNOSIS — M7061 Trochanteric bursitis, right hip: Secondary | ICD-10-CM

## 2023-07-04 DIAGNOSIS — Z9889 Other specified postprocedural states: Secondary | ICD-10-CM | POA: Diagnosis not present

## 2023-07-04 DIAGNOSIS — M3509 Sicca syndrome with other organ involvement: Secondary | ICD-10-CM | POA: Diagnosis not present

## 2023-07-04 DIAGNOSIS — M7062 Trochanteric bursitis, left hip: Secondary | ICD-10-CM

## 2023-07-04 MED ORDER — METHOCARBAMOL 500 MG PO TABS: ORAL_TABLET | ORAL | 0 refills | Status: AC

## 2023-07-05 NOTE — Progress Notes (Signed)
Glucose is 115. Total protein is borderline low 6.0. rest of CMP WNL.  CBC WNL ESR and CRP Wnl RF negative  No proteinuria  Complements Wnl

## 2023-07-09 ENCOUNTER — Ambulatory Visit: Payer: Medicare PPO | Attending: Physician Assistant | Admitting: Physician Assistant

## 2023-07-09 VITALS — BP 135/74 | HR 94

## 2023-07-09 DIAGNOSIS — M7062 Trochanteric bursitis, left hip: Secondary | ICD-10-CM | POA: Insufficient documentation

## 2023-07-09 MED ORDER — TRIAMCINOLONE ACETONIDE 40 MG/ML IJ SUSP
40.0000 mg | INTRAMUSCULAR | Status: AC | PRN
Start: 2023-07-09 — End: 2023-07-09
  Administered 2023-07-09: 40 mg via INTRA_ARTICULAR

## 2023-07-09 MED ORDER — LIDOCAINE HCL 1 % IJ SOLN
1.5000 mL | INTRAMUSCULAR | Status: AC | PRN
Start: 2023-07-09 — End: 2023-07-09
  Administered 2023-07-09: 1.5 mL

## 2023-07-09 NOTE — Progress Notes (Signed)
ANA remains positive.  Ro and La antibodies are negative

## 2023-07-09 NOTE — Progress Notes (Signed)
   Procedure Note  Patient: Susan Carpenter             Date of Birth: 06-23-1946           MRN: [id number]             Visit Date: 07/09/2023  Procedures: Visit Diagnoses:  1. Trochanteric bursitis, left hip     Large Joint Inj: L greater trochanter on 07/09/2023 10:39 AM Indications: pain Details: 27 G 1.5 in needle, lateral approach  Arthrogram: No  Medications: 1.5 mL lidocaine 1 %; 40 mg triamcinolone acetonide 40 MG/ML Aspirate: 0 mL Outcome: tolerated well, no immediate complications Procedure, treatment alternatives, risks and benefits explained, specific risks discussed. Consent was given by the patient. Immediately prior to procedure a time out was called to verify the correct patient, procedure, equipment, support staff and site/side marked as required. Patient was prepped and draped in the usual sterile fashion.    Patient tolerated the procedure well.  Aftercare was discussed.  Sherron Ales, PA-C

## 2023-07-13 LAB — PROTEIN ELECTROPHORESIS, SERUM, WITH REFLEX
Albumin ELP: 3.8 g/dL (ref 3.8–4.8)
Alpha 1: 0.2 g/dL (ref 0.2–0.3)
Alpha 2: 0.5 g/dL (ref 0.5–0.9)
Beta 2: 0.3 g/dL (ref 0.2–0.5)
Beta Globulin: 0.4 g/dL (ref 0.4–0.6)
Gamma Globulin: 0.6 g/dL — ABNORMAL LOW (ref 0.8–1.7)
Total Protein: 5.9 g/dL — ABNORMAL LOW (ref 6.1–8.1)

## 2023-07-13 LAB — COMPLETE METABOLIC PANEL WITH GFR
AG Ratio: 1.9 (calc) (ref 1.0–2.5)
ALT: 14 U/L (ref 6–29)
AST: 19 U/L (ref 10–35)
Albumin: 3.9 g/dL (ref 3.6–5.1)
Alkaline phosphatase (APISO): 75 U/L (ref 37–153)
BUN: 18 mg/dL (ref 7–25)
CO2: 25 mmol/L (ref 20–32)
Calcium: 9 mg/dL (ref 8.6–10.4)
Chloride: 106 mmol/L (ref 98–110)
Creat: 0.75 mg/dL (ref 0.60–1.00)
Globulin: 2.1 g/dL (ref 1.9–3.7)
Glucose, Bld: 115 mg/dL — ABNORMAL HIGH (ref 65–99)
Potassium: 3.7 mmol/L (ref 3.5–5.3)
Sodium: 140 mmol/L (ref 135–146)
Total Bilirubin: 0.3 mg/dL (ref 0.2–1.2)
Total Protein: 6 g/dL — ABNORMAL LOW (ref 6.1–8.1)
eGFR: 82 mL/min/{1.73_m2} (ref >=60)

## 2023-07-13 LAB — CBC WITH DIFFERENTIAL/PLATELET
Absolute Monocytes: 357 {cells}/uL (ref 200–950)
Basophils Absolute: 63 {cells}/uL (ref 0–200)
Basophils Relative: 0.9 %
Eosinophils Absolute: 308 {cells}/uL (ref 15–500)
Eosinophils Relative: 4.4 %
HCT: 36.8 % (ref 35.0–45.0)
Hemoglobin: 12.2 g/dL (ref 11.7–15.5)
Lymphs Abs: 1498 {cells}/uL (ref 850–3900)
MCH: 30.9 pg (ref 27.0–33.0)
MCHC: 33.2 g/dL (ref 32.0–36.0)
MCV: 93.2 fL (ref 80.0–100.0)
MPV: 10.5 fL (ref 7.5–12.5)
Monocytes Relative: 5.1 %
Neutro Abs: 4774 {cells}/uL (ref 1500–7800)
Neutrophils Relative %: 68.2 %
Platelets: 196 10*3/uL (ref 140–400)
RBC: 3.95 10*6/uL (ref 3.80–5.10)
RDW: 12.4 % (ref 11.0–15.0)
Total Lymphocyte: 21.4 %
WBC: 7 10*3/uL (ref 3.8–10.8)

## 2023-07-13 LAB — PROTEIN / CREATININE RATIO, URINE
Creatinine, Urine: 58 mg/dL (ref 20–275)
Total Protein, Urine: <4 mg/dL — ABNORMAL LOW (ref 5–24)

## 2023-07-13 LAB — ANA: Anti Nuclear Antibody (ANA): POSITIVE — AB

## 2023-07-13 LAB — SJOGRENS SYNDROME-B EXTRACTABLE NUCLEAR ANTIBODY: SSB (La) (ENA) Antibody, IgG: <1 AI

## 2023-07-13 LAB — ANTI-NUCLEAR AB-TITER (ANA TITER): ANA Titer 1: 1:40 {titer} — ABNORMAL HIGH

## 2023-07-13 LAB — RHEUMATOID FACTOR: Rheumatoid fact SerPl-aCnc: <10 [IU]/mL (ref <14)

## 2023-07-13 LAB — IFE INTERPRETATION

## 2023-07-13 LAB — SEDIMENTATION RATE: Sed Rate: 2 mm/h (ref 0–30)

## 2023-07-13 LAB — SJOGRENS SYNDROME-A EXTRACTABLE NUCLEAR ANTIBODY: SSA (Ro) (ENA) Antibody, IgG: <1 AI

## 2023-07-13 LAB — C3 AND C4
C3 Complement: 118 mg/dL (ref 83–193)
C4 Complement: 20 mg/dL (ref 15–57)

## 2023-07-13 LAB — C-REACTIVE PROTEIN: CRP: <3 mg/L (ref <8.0)

## 2023-07-15 DIAGNOSIS — Z79899 Other long term (current) drug therapy: Secondary | ICD-10-CM | POA: Diagnosis not present

## 2023-07-15 DIAGNOSIS — L661 Lichen planopilaris: Secondary | ICD-10-CM | POA: Diagnosis not present

## 2023-07-15 NOTE — Progress Notes (Signed)
IFE-normal pattern. No monoclonal proteins.

## 2023-07-16 ENCOUNTER — Telehealth: Payer: Self-pay | Admitting: *Deleted

## 2023-07-16 DIAGNOSIS — Z6829 Body mass index (BMI) 29.0-29.9, adult: Secondary | ICD-10-CM | POA: Diagnosis not present

## 2023-07-16 DIAGNOSIS — R110 Nausea: Secondary | ICD-10-CM | POA: Diagnosis not present

## 2023-07-16 NOTE — Telephone Encounter (Signed)
Received office notes from dermatology. Reviewed by Ladona Ridgel. Notes mention of increasing PLQ and MTX. Per Ladona Ridgel please clarify if patient wants to try increasing PLQ dose.  Reached out to the patient to inquire if she would like to increase her PLQ dose. Patient states at this time she would like to stay at her current dose. Patient will call back if she changes her mind.

## 2023-07-24 ENCOUNTER — Other Ambulatory Visit (HOSPITAL_COMMUNITY): Payer: Self-pay | Admitting: Family Medicine

## 2023-07-24 DIAGNOSIS — Z6829 Body mass index (BMI) 29.0-29.9, adult: Secondary | ICD-10-CM | POA: Diagnosis not present

## 2023-07-24 DIAGNOSIS — R110 Nausea: Secondary | ICD-10-CM | POA: Diagnosis not present

## 2023-07-28 ENCOUNTER — Ambulatory Visit (HOSPITAL_BASED_OUTPATIENT_CLINIC_OR_DEPARTMENT_OTHER)
Admission: RE | Admit: 2023-07-28 | Discharge: 2023-07-28 | Disposition: A | Discharge status: Home / Self Care | Payer: Medicare PPO | Source: Ambulatory Visit | Attending: Family Medicine | Admitting: Family Medicine

## 2023-07-28 DIAGNOSIS — R110 Nausea: Secondary | ICD-10-CM | POA: Insufficient documentation

## 2023-07-28 DIAGNOSIS — Z9049 Acquired absence of other specified parts of digestive tract: Secondary | ICD-10-CM | POA: Diagnosis not present

## 2023-08-07 DIAGNOSIS — M25552 Pain in left hip: Secondary | ICD-10-CM | POA: Diagnosis not present

## 2023-08-08 DIAGNOSIS — R1013 Epigastric pain: Secondary | ICD-10-CM | POA: Diagnosis not present

## 2023-08-08 DIAGNOSIS — K219 Gastro-esophageal reflux disease without esophagitis: Secondary | ICD-10-CM | POA: Diagnosis not present

## 2023-08-08 DIAGNOSIS — R110 Nausea: Secondary | ICD-10-CM | POA: Diagnosis not present

## 2023-08-12 DIAGNOSIS — K2930 Chronic superficial gastritis without bleeding: Secondary | ICD-10-CM | POA: Diagnosis not present

## 2023-08-12 DIAGNOSIS — K219 Gastro-esophageal reflux disease without esophagitis: Secondary | ICD-10-CM | POA: Diagnosis not present

## 2023-08-12 DIAGNOSIS — K449 Diaphragmatic hernia without obstruction or gangrene: Secondary | ICD-10-CM | POA: Diagnosis not present

## 2023-08-12 DIAGNOSIS — R1013 Epigastric pain: Secondary | ICD-10-CM | POA: Diagnosis not present

## 2023-08-12 DIAGNOSIS — R110 Nausea: Secondary | ICD-10-CM | POA: Diagnosis not present

## 2023-08-14 DIAGNOSIS — K2930 Chronic superficial gastritis without bleeding: Secondary | ICD-10-CM | POA: Diagnosis not present

## 2023-08-15 DIAGNOSIS — M25552 Pain in left hip: Secondary | ICD-10-CM | POA: Diagnosis not present

## 2023-08-30 ENCOUNTER — Other Ambulatory Visit (HOSPITAL_COMMUNITY): Payer: Self-pay | Admitting: Gastroenterology

## 2023-08-30 DIAGNOSIS — R1013 Epigastric pain: Secondary | ICD-10-CM

## 2023-08-30 DIAGNOSIS — I2510 Atherosclerotic heart disease of native coronary artery without angina pectoris: Secondary | ICD-10-CM | POA: Diagnosis not present

## 2023-08-30 DIAGNOSIS — R110 Nausea: Secondary | ICD-10-CM

## 2023-09-05 ENCOUNTER — Ambulatory Visit (HOSPITAL_COMMUNITY)
Admission: RE | Admit: 2023-09-05 | Discharge: 2023-09-05 | Disposition: A | Discharge status: Home / Self Care | Payer: Medicare PPO | Source: Ambulatory Visit | Attending: Gastroenterology | Admitting: Gastroenterology

## 2023-09-05 DIAGNOSIS — K449 Diaphragmatic hernia without obstruction or gangrene: Secondary | ICD-10-CM | POA: Diagnosis not present

## 2023-09-05 DIAGNOSIS — R1013 Epigastric pain: Secondary | ICD-10-CM | POA: Diagnosis not present

## 2023-09-05 DIAGNOSIS — K429 Umbilical hernia without obstruction or gangrene: Secondary | ICD-10-CM | POA: Diagnosis not present

## 2023-09-05 DIAGNOSIS — R110 Nausea: Secondary | ICD-10-CM | POA: Insufficient documentation

## 2023-09-05 DIAGNOSIS — K5730 Diverticulosis of large intestine without perforation or abscess without bleeding: Secondary | ICD-10-CM | POA: Diagnosis not present

## 2023-09-05 MED ORDER — IOHEXOL 350 MG/ML SOLN
75.0000 mL | Freq: Once | INTRAVENOUS | Status: AC | PRN
  Administered 2023-09-05: 75 mL via INTRAVENOUS

## 2023-09-25 DIAGNOSIS — A09 Infectious gastroenteritis and colitis, unspecified: Secondary | ICD-10-CM | POA: Diagnosis not present

## 2023-10-10 DIAGNOSIS — M25552 Pain in left hip: Secondary | ICD-10-CM | POA: Diagnosis not present

## 2023-10-17 DIAGNOSIS — M1612 Unilateral primary osteoarthritis, left hip: Secondary | ICD-10-CM | POA: Diagnosis not present

## 2023-10-19 ENCOUNTER — Ambulatory Visit
Admission: RE | Admit: 2023-10-19 | Discharge: 2023-10-19 | Disposition: A | Discharge status: Home / Self Care | Payer: Medicare PPO | Source: Ambulatory Visit | Attending: Family Medicine | Admitting: Family Medicine

## 2023-10-19 ENCOUNTER — Ambulatory Visit: Payer: Medicare PPO

## 2023-10-19 VITALS — BP 151/83 | HR 96 | Temp 98.4°F | Resp 14

## 2023-10-19 DIAGNOSIS — I700 Atherosclerosis of aorta: Secondary | ICD-10-CM | POA: Diagnosis not present

## 2023-10-19 DIAGNOSIS — R058 Other specified cough: Secondary | ICD-10-CM | POA: Diagnosis not present

## 2023-10-19 DIAGNOSIS — J208 Acute bronchitis due to other specified organisms: Secondary | ICD-10-CM | POA: Diagnosis not present

## 2023-10-19 MED ORDER — BENZONATATE 100 MG PO CAPS: 100.0000 mg | ORAL_CAPSULE | Freq: Three times a day (TID) | ORAL | 0 refills | Status: AC

## 2023-10-19 MED ORDER — AZELASTINE HCL 0.1 % NA SOLN: 1.0000 | Freq: Two times a day (BID) | NASAL | 0 refills | Status: AC

## 2023-10-19 MED ORDER — PREDNISONE 20 MG PO TABS: 40.0000 mg | ORAL_TABLET | Freq: Every day | ORAL | 0 refills | Status: AC

## 2023-10-19 NOTE — ED Triage Notes (Signed)
Pt reports she has a cough, runny nose, and sore throat x 5 days

## 2023-10-19 NOTE — Discharge Instructions (Addendum)
In addition to the prescribed medications, you may take Coricidin HBP, plain Mucinex, Flonase, sinus rinses with warm saline and use humidifiers, warm honey tea.  If anything comes back abnormal on your chest x-ray someone will give you a call

## 2023-10-23 NOTE — ED Provider Notes (Signed)
[facility]    CSN: [id number] Arrival date & time: 10/19/23  1400      History   Chief Complaint Chief Complaint  Patient presents with   Cough    Congestion in head and chest - Entered by patient    HPI Susan Carpenter is a 77 y.o. female.   Pt reports she has a cough, runny nose, and sore throat x 5 days   Presenting today with 5 day history of cough, rhinorrhea, sore throat. Denies fever, chills, CP, SOB, abdominal pain, N/V/D. So far trying OTC remedies with minimal relief.     Past Medical History:  Diagnosis Date   Allergy    rhinitis   ANA positive    Arthritis    Complication of anesthesia    Constipation    Fibromyalgia    Frontal fibrosing alopecia    Dr. Corky Downs    GERD (gastroesophageal reflux disease)    Heart murmur    Helicobacter pylori (H. pylori) infection    s/p treatment   Hiatal hernia    Interstitial cystitis    Pain in joint    site unspecified   PONV (postoperative nausea and vomiting)    Psoriasis    Psoriatic arthritis (HCC)    Sjogren's syndrome [facility]     Patient Active Problem List   Diagnosis Date Noted   Status post left rotator cuff repair 10/01/2018   History of total knee replacement, right 10/01/2018   DDD (degenerative disc disease), cervical 10/01/2018   DDD (degenerative disc disease), lumbar 10/01/2018   Cough variant asthma vs uacs 02/02/2018   Chronic cough 12/20/2017   Other insomnia 01/15/2017   Other fatigue 01/15/2017   Trapezius muscle spasm 01/15/2017   Alopecia, scarring 09/21/2016   Primary osteoarthritis of both hands 09/19/2016   OA (osteoarthritis) of knee 08/01/2015   INTERSTITIAL CYSTITIS 01/31/2010   Fibromyalgia 01/31/2010   FATIGUE 01/31/2010   DYSPNEA/SHORTNESS OF BREATH 01/31/2010   FREQUENCY, URINARY 01/31/2010   Depression 01/24/2009   Migraines 09/04/2008   HELICOBACTER PYLORI INFECTION 10/14/2007   Allergic rhinitis 10/14/2007   HIATAL HERNIA 10/14/2007   ANA POSITIVE  10/14/2007   Hypothyroidism 10/10/2007   PAIN IN JOINT, SITE UNSPECIFIED 10/10/2007   WEIGHT GAIN 10/10/2007    Past Surgical History:  Procedure Laterality Date   BLADDER SURGERY     x2 for scar tissue   CHOLECYSTECTOMY  1995   cysts on ovaries     polyp removal  1984   from fallopian tube   ROTATOR CUFF REPAIR Left 06/2017   THUMB SURGERY Right 01/16/2016   TOTAL KNEE ARTHROPLASTY Right 08/01/2015   Procedure: RIGHT TOTAL KNEE ARTHROPLASTY;  Surgeon: Ollen Gross, MD;  Location: [facility];  Service: Orthopedics;  Laterality: Right;   TUBAL LIGATION      OB History   No obstetric history on file.      Home Medications    Prior to Admission medications   Medication Sig Start Date End Date Taking? Authorizing Provider  azelastine (ASTELIN) 0.1 % nasal spray Place 1 spray into both nostrils 2 (two) times daily. Use in each nostril as directed 10/19/23  Yes Particia Nearing, PA-C  benzonatate (TESSALON) 100 MG capsule Take 1 capsule (100 mg total) by mouth every 8 (eight) hours. 10/19/23  Yes Particia Nearing, PA-C  predniSONE (DELTASONE) 20 MG tablet Take 2 tablets (40 mg total) by mouth daily with breakfast. 10/19/23  Yes Particia Nearing, PA-C  betamethasone dipropionate  0.05 % lotion betamethasone dipropionate 0.05 % lotion  APPLY TO FRONTAL HAIRLINE TOP OF SCALP AND AREAS OF ITCHING ONCE A DAY    [provider]  Biotin 5000 MCG TABS Take by mouth daily.    [provider]  Calcium-Magnesium-Vitamin D (CALCIUM 500 PO) Take by mouth 2 (two) times daily.    [provider]  cetirizine (ZYRTEC) 10 MG tablet Take 10 mg by mouth as needed for allergies.    [provider]  dutasteride (AVODART) 0.5 MG capsule Take 0.5 mg by mouth daily. Patient not taking: Reported on 07/04/2023 02/10/20   [provider]  EPIPEN 2-PAK 0.3 MG/0.3ML SOAJ injection use as directed for INJECTION 05/23/15   [provider]   esomeprazole (NEXIUM) 40 MG capsule TAKE 1 CAPSULE BY MOUTH TWICE DAILY TAKE  30-60  MIN  BEFORE  YOUR  FIRST  AND  LAST  MEALS  OF  THE  DAY 04/23/23   Nyoka Cowden, MD  folic acid (FOLVITE) 1 MG tablet Take 1 mg by mouth daily.    [provider]  gabapentin (NEURONTIN) 100 MG capsule Take 1 capsule (100 mg total) by mouth 3 (three) times daily. One three times daily Patient not taking: Reported on 04/29/2023 11/22/21   Nyoka Cowden, MD  hydroxychloroquine (PLAQUENIL) 200 MG tablet Take 200 mg by mouth daily. 04/30/20   [provider]  Ibuprofen (ADVIL) 200 MG CAPS as needed. 12/06/22   [provider]  loratadine (CLARITIN) 10 MG tablet Take 10 mg by mouth daily. 03/23/21   [provider]  Magnesium 400 MG CAPS Take by mouth daily.    [provider]  methocarbamol (ROBAXIN) 500 MG tablet TAKE 1 TABLET BY MOUTH DAILY AS NEEDED FOR MUSCLE SPASM 07/04/23   Gearldine Bienenstock, PA-C  methotrexate (RHEUMATREX) 2.5 MG tablet Take 7.5 mg by mouth once a week. Caution:Chemotherapy. Protect from light.    [provider]  minoxidil (LONITEN) 2.5 MG tablet Take 1 tablet by mouth daily. 04/30/23   [provider]  MINOXIDIL PO Take by mouth daily.    [provider]  naproxen sodium (ANAPROX) 220 MG tablet Take by mouth as needed.     [provider]  Omega-3 Fatty Acids (FISH OIL) 1000 MG CAPS Take 1,000 mg by mouth daily.    [provider]  Probiotic Product (PROBIOTIC PO) Take by mouth daily.    [provider]  RESTASIS 0.05 % ophthalmic emulsion 1 drop 2 (two) times daily. 05/27/23   [provider]  rosuvastatin (CRESTOR) 10 MG tablet Take 10 mg by mouth daily.    [provider]  sertraline (ZOLOFT) 50 MG tablet Take 75 mg by mouth daily.  09/13/16   [provider]  TURMERIC PO Herbal Name: Tumeric    [provider]  VITAMIN D PO Take 1 tablet by mouth daily.     [provider]    Family History Family History  Problem Relation Age of Onset   Diabetes Father    Heart disease Father        MI   Alcohol abuse Father    Cancer Mother        COLON    Mental illness Mother        ALZHEIMERS; DEPRESSION   Thyroid disease Mother        HYPOTHYROID   Alcohol abuse Maternal Uncle    Diabetes Maternal Uncle    Hypertension  Maternal Grandmother    Stroke Maternal Grandmother     Social History Social History   Tobacco Use   Smoking status: Never    Passive exposure: Never   Smokeless tobacco: Never  Vaping Use   Vaping status: Never Used  Substance Use Topics   Alcohol use: Yes    Alcohol/week: 1.0 standard drink of alcohol    Types: 1 Cans of beer per week   Drug use: No     Allergies   Cephalexin, Oyster shell, Penicillins, Shellfish allergy, Codeine, Doxycycline, and Erythromycin   Review of Systems Review of Systems PER HPI  Physical Exam Triage Vital Signs ED Triage Vitals  Encounter Vitals Group     BP 10/19/23 1409 (!) 151/83     Systolic BP Percentile --      Diastolic BP Percentile --      Pulse Rate 10/19/23 1409 96     Resp 10/19/23 1409 14     Temp 10/19/23 1409 98.4 F (36.9 C)     Temp Source 10/19/23 1409 Oral     SpO2 10/19/23 1409 92 %     Weight --      Height --      Head Circumference --      Peak Flow --      Pain Score 10/19/23 1410 0     Pain Loc --      Pain Education --      Exclude from Growth Chart --    No data found.  Updated Vital Signs BP (!) 151/83 (BP Location: Right Arm)   Pulse 96   Temp 98.4 F (36.9 C) (Oral)   Resp 14   SpO2 92%   Visual Acuity Right Eye Distance:   Left Eye Distance:   Bilateral Distance:    Right Eye Near:   Left Eye Near:    Bilateral Near:     Physical Exam Vitals and nursing note reviewed.  Constitutional:      Appearance: Normal appearance.  HENT:     Head: Atraumatic.     Right Ear: Tympanic membrane and external ear  normal.     Left Ear: Tympanic membrane and external ear normal.     Nose: Rhinorrhea present.     Mouth/Throat:     Mouth: Mucous membranes are moist.     Pharynx: Posterior oropharyngeal erythema present.  Eyes:     Extraocular Movements: Extraocular movements intact.     Conjunctiva/sclera: Conjunctivae normal.  Cardiovascular:     Rate and Rhythm: Normal rate and regular rhythm.     Heart sounds: Normal heart sounds.  Pulmonary:     Effort: Pulmonary effort is normal.     Breath sounds: Normal breath sounds. No wheezing.  Musculoskeletal:        General: Normal range of motion.     Cervical back: Normal range of motion and neck supple.  Skin:    General: Skin is warm and dry.  Neurological:     Mental Status: She is alert and oriented to person, place, and time.  Psychiatric:        Mood and Affect: Mood normal.        Thought Content: Thought content normal.      UC Treatments / Results  Labs (all labs ordered are listed, but only abnormal results are displayed) Labs Reviewed - No data to display  EKG   Radiology No results found.  Procedures Procedures (including critical care time)  Medications Ordered  in UC Medications - No data to display  Initial Impression / Assessment and Plan / UC Course  I have reviewed the triage vital signs and the nursing notes.  Pertinent labs & imaging results that were available during my care of the patient were reviewed by me and considered in my medical decision making (see chart for details).     Vitals and exam overall reassuring, CXR neg for pneumonia. Treat for bronchitis with prednisone, astelin, and tessalon and supportive OTC medications home care. Return for worsening sxs.  Final Clinical Impressions(s) / UC Diagnoses   Final diagnoses:  Viral bronchitis     Discharge Instructions      In addition to the prescribed medications, you may take Coricidin HBP, plain Mucinex, Flonase, sinus rinses with warm  saline and use humidifiers, warm honey tea.  If anything comes back abnormal on your chest x-ray someone will give you a call    ED Prescriptions     Medication Sig Dispense Auth. Provider   predniSONE (DELTASONE) 20 MG tablet Take 2 tablets (40 mg total) by mouth daily with breakfast. 10 tablet Particia Nearing, PA-C   azelastine (ASTELIN) 0.1 % nasal spray Place 1 spray into both nostrils 2 (two) times daily. Use in each nostril as directed 30 mL Particia Nearing, PA-C   benzonatate (TESSALON) 100 MG capsule Take 1 capsule (100 mg total) by mouth every 8 (eight) hours. 21 capsule Particia Nearing, [place]      PDMP not reviewed this encounter.   Particia Nearing, [place] 10/23/23 2139

## 2023-11-25 NOTE — Progress Notes (Deleted)
 Office Visit Note  Patient: Susan Carpenter             Date of Birth: December 05, 1945           MRN: [id number]             PCP: Gweneth Dimitri, MD Referring: Gweneth Dimitri, MD Visit Date: 12/09/2023 Occupation: @GUAROCC @  Subjective:  No chief complaint on file.   History of Present Illness: Susan Carpenter is a 78 y.o. female ***     Activities of Daily Living:  Patient reports morning stiffness for *** {minute/hour:19697}.   Patient {ACTIONS;DENIES/REPORTS:21021675::"Denies"} nocturnal pain.  Difficulty dressing/grooming: {ACTIONS;DENIES/REPORTS:21021675::"Denies"} Difficulty climbing stairs: {ACTIONS;DENIES/REPORTS:21021675::"Denies"} Difficulty getting out of chair: {ACTIONS;DENIES/REPORTS:21021675::"Denies"} Difficulty using hands for taps, buttons, cutlery, and/or writing: {ACTIONS;DENIES/REPORTS:21021675::"Denies"}  No Rheumatology ROS completed.   PMFS History:  Patient Active Problem List   Diagnosis Date Noted   Status post left rotator cuff repair 10/01/2018   History of total knee replacement, right 10/01/2018   DDD (degenerative disc disease), cervical 10/01/2018   DDD (degenerative disc disease), lumbar 10/01/2018   Cough variant asthma vs uacs 02/02/2018   Chronic cough 12/20/2017   Other insomnia 01/15/2017   Other fatigue 01/15/2017   Trapezius muscle spasm 01/15/2017   Alopecia, scarring 09/21/2016   Primary osteoarthritis of both hands 09/19/2016   OA (osteoarthritis) of knee 08/01/2015   INTERSTITIAL CYSTITIS 01/31/2010   Fibromyalgia 01/31/2010   FATIGUE 01/31/2010   DYSPNEA/SHORTNESS OF BREATH 01/31/2010   FREQUENCY, URINARY 01/31/2010   Depression 01/24/2009   Migraines 09/04/2008   HELICOBACTER PYLORI INFECTION 10/14/2007   Allergic rhinitis 10/14/2007   HIATAL HERNIA 10/14/2007   ANA POSITIVE 10/14/2007   Hypothyroidism 10/10/2007   PAIN IN JOINT, SITE UNSPECIFIED 10/10/2007   WEIGHT GAIN 10/10/2007    Past Medical History:   Diagnosis Date   Allergy    rhinitis   ANA positive    Arthritis    Complication of anesthesia    Constipation    Fibromyalgia    Frontal fibrosing alopecia    Dr. Corky Downs    GERD (gastroesophageal reflux disease)    Heart murmur    Helicobacter pylori (H. pylori) infection    s/p treatment   Hiatal hernia    Interstitial cystitis    Pain in joint    site unspecified   PONV (postoperative nausea and vomiting)    Psoriasis    Psoriatic arthritis (HCC)    Sjogren's syndrome (HCC)     Family History  Problem Relation Age of Onset   Diabetes Father    Heart disease Father        MI   Alcohol abuse Father    Cancer Mother        COLON    Mental illness Mother        ALZHEIMERS; DEPRESSION   Thyroid disease Mother        HYPOTHYROID   Alcohol abuse Maternal Uncle    Diabetes Maternal Uncle    Hypertension Maternal Grandmother    Stroke Maternal Grandmother    Past Surgical History:  Procedure Laterality Date   BLADDER SURGERY     x2 for scar tissue   CHOLECYSTECTOMY  1995   cysts on ovaries     polyp removal  1984   from fallopian tube   ROTATOR CUFF REPAIR Left 06/2017   THUMB SURGERY Right 01/16/2016   TOTAL KNEE ARTHROPLASTY Right 08/01/2015   Procedure: RIGHT TOTAL KNEE ARTHROPLASTY;  Surgeon: Ollen Gross, MD;  Location:  [facility];  Service: Orthopedics;  Laterality: Right;   TUBAL LIGATION     Social History   Social History Narrative   Not on file   Immunization History  Administered Date(s) Administered   Influenza Whole 08/05/2004   Influenza, High Dose Seasonal PF 08/04/2018   Influenza,inj,Quad PF,6+ Mos 11/22/2021   Influenza-Unspecified 08/05/2013   PFIZER(Purple Top)SARS-COV-2 Vaccination 12/04/2019, 12/26/2019, 08/22/2020   Td 07/06/2004     Objective: Vital Signs: There were no vitals taken for this visit.   Physical Exam   Musculoskeletal Exam: ***  CDAI Exam: CDAI Score: -- Patient Global: --; Provider Global: -- Swollen: --;  Tender: -- Joint Exam 12/09/2023   No joint exam has been documented for this visit   There is currently no information documented on the homunculus. Go to the Rheumatology activity and complete the homunculus joint exam.  Investigation: No additional findings.  Imaging: No results found.  Recent Labs: Lab Results  Component Value Date   WBC 7.0 07/04/2023   HGB 12.2 07/04/2023   PLT 196 07/04/2023   NA 140 07/04/2023   K 3.7 07/04/2023   CL 106 07/04/2023   CO2 25 07/04/2023   GLUCOSE 115 (H) 07/04/2023   BUN 18 07/04/2023   CREATININE 0.75 07/04/2023   BILITOT 0.3 07/04/2023   ALKPHOS 82 01/15/2017   AST 19 07/04/2023   ALT 14 07/04/2023   PROT 6.0 (L) 07/04/2023   PROT 5.9 (L) 07/04/2023   ALBUMIN 4.0 01/15/2017   CALCIUM 9.0 07/04/2023   GFRAA 73 07/12/2020    Speciality Comments: PLQ EYE EXAM 12/09/2020 [facility]  Procedures:  No procedures performed Allergies: Cephalexin, Oyster shell, Penicillins, Shellfish allergy, Codeine, Doxycycline, and Erythromycin   Assessment / Plan:     Visit Diagnoses: Sjogren's syndrome with other organ involvement (HCC)  High risk medication use  Fibromyalgia  Trochanteric bursitis, left hip  Other fatigue  Other insomnia  Primary osteoarthritis of both hands  Status post left rotator cuff repair  History of total knee replacement, right  Primary osteoarthritis of both feet  DDD (degenerative disc disease), cervical  Degeneration of intervertebral disc of lumbar region without discogenic back pain or lower extremity pain  History of gastroesophageal reflux (GERD)  History of hypothyroidism  History of depression  Interstitial cystitis  Orders: No orders of the defined types were placed in this encounter.  No orders of the defined types were placed in this encounter.   Face-to-face time spent with patient was *** minutes. Greater than 50% of time was spent in counseling and  coordination of care.  Follow-Up Instructions: No follow-ups on file.   Gearldine Bienenstock, PA-C  Note - This record has been created using Dragon software.  Chart creation errors have been sought, but may not always  have been located. Such creation errors do not reflect on  the standard of medical care.

## 2023-11-26 DIAGNOSIS — M3501 Sicca syndrome with keratoconjunctivitis: Secondary | ICD-10-CM | POA: Diagnosis not present

## 2023-11-26 DIAGNOSIS — H04123 Dry eye syndrome of bilateral lacrimal glands: Secondary | ICD-10-CM | POA: Diagnosis not present

## 2023-12-04 DIAGNOSIS — M1712 Unilateral primary osteoarthritis, left knee: Secondary | ICD-10-CM | POA: Diagnosis not present

## 2023-12-04 DIAGNOSIS — M25552 Pain in left hip: Secondary | ICD-10-CM | POA: Diagnosis not present

## 2023-12-09 ENCOUNTER — Ambulatory Visit: Payer: Medicare PPO | Admitting: Physician Assistant

## 2023-12-09 DIAGNOSIS — Z9889 Other specified postprocedural states: Secondary | ICD-10-CM

## 2023-12-09 DIAGNOSIS — M51369 Other intervertebral disc degeneration, lumbar region without mention of lumbar back pain or lower extremity pain: Secondary | ICD-10-CM

## 2023-12-09 DIAGNOSIS — Z8659 Personal history of other mental and behavioral disorders: Secondary | ICD-10-CM

## 2023-12-09 DIAGNOSIS — M797 Fibromyalgia: Secondary | ICD-10-CM

## 2023-12-09 DIAGNOSIS — N3010 Interstitial cystitis (chronic) without hematuria: Secondary | ICD-10-CM

## 2023-12-09 DIAGNOSIS — G4709 Other insomnia: Secondary | ICD-10-CM

## 2023-12-09 DIAGNOSIS — M19072 Primary osteoarthritis, left ankle and foot: Secondary | ICD-10-CM

## 2023-12-09 DIAGNOSIS — M19041 Primary osteoarthritis, right hand: Secondary | ICD-10-CM

## 2023-12-09 DIAGNOSIS — Z8639 Personal history of other endocrine, nutritional and metabolic disease: Secondary | ICD-10-CM

## 2023-12-09 DIAGNOSIS — M5030 Other cervical disc degeneration, unspecified cervical region: Secondary | ICD-10-CM

## 2023-12-09 DIAGNOSIS — M3509 Sicca syndrome with other organ involvement: Secondary | ICD-10-CM

## 2023-12-09 DIAGNOSIS — Z8719 Personal history of other diseases of the digestive system: Secondary | ICD-10-CM

## 2023-12-09 DIAGNOSIS — Z96651 Presence of right artificial knee joint: Secondary | ICD-10-CM

## 2023-12-09 DIAGNOSIS — R5383 Other fatigue: Secondary | ICD-10-CM

## 2023-12-09 DIAGNOSIS — M7062 Trochanteric bursitis, left hip: Secondary | ICD-10-CM

## 2023-12-09 DIAGNOSIS — G8929 Other chronic pain: Secondary | ICD-10-CM

## 2023-12-09 DIAGNOSIS — L570 Actinic keratosis: Secondary | ICD-10-CM | POA: Diagnosis not present

## 2023-12-09 DIAGNOSIS — Z79899 Other long term (current) drug therapy: Secondary | ICD-10-CM

## 2023-12-11 DIAGNOSIS — M1712 Unilateral primary osteoarthritis, left knee: Secondary | ICD-10-CM | POA: Diagnosis not present

## 2023-12-12 DIAGNOSIS — M797 Fibromyalgia: Secondary | ICD-10-CM | POA: Diagnosis not present

## 2023-12-12 DIAGNOSIS — F3340 Major depressive disorder, recurrent, in remission, unspecified: Secondary | ICD-10-CM | POA: Diagnosis not present

## 2023-12-12 DIAGNOSIS — M3509 Sicca syndrome with other organ involvement: Secondary | ICD-10-CM | POA: Diagnosis not present

## 2023-12-12 DIAGNOSIS — Z79899 Other long term (current) drug therapy: Secondary | ICD-10-CM | POA: Diagnosis not present

## 2023-12-12 DIAGNOSIS — K219 Gastro-esophageal reflux disease without esophagitis: Secondary | ICD-10-CM | POA: Diagnosis not present

## 2023-12-12 DIAGNOSIS — E7800 Pure hypercholesterolemia, unspecified: Secondary | ICD-10-CM | POA: Diagnosis not present

## 2023-12-12 DIAGNOSIS — Z130 Encounter for screening for diseases of the blood and blood-forming organs and certain disorders involving the immune mechanism: Secondary | ICD-10-CM | POA: Diagnosis not present

## 2023-12-12 DIAGNOSIS — I2510 Atherosclerotic heart disease of native coronary artery without angina pectoris: Secondary | ICD-10-CM | POA: Diagnosis not present

## 2023-12-12 DIAGNOSIS — M1612 Unilateral primary osteoarthritis, left hip: Secondary | ICD-10-CM | POA: Diagnosis not present

## 2023-12-19 DIAGNOSIS — M1712 Unilateral primary osteoarthritis, left knee: Secondary | ICD-10-CM | POA: Diagnosis not present

## 2024-01-02 DIAGNOSIS — M1612 Unilateral primary osteoarthritis, left hip: Secondary | ICD-10-CM | POA: Diagnosis not present

## 2024-01-03 DIAGNOSIS — R140 Abdominal distension (gaseous): Secondary | ICD-10-CM | POA: Diagnosis not present

## 2024-01-03 DIAGNOSIS — K449 Diaphragmatic hernia without obstruction or gangrene: Secondary | ICD-10-CM | POA: Diagnosis not present

## 2024-01-03 DIAGNOSIS — K219 Gastro-esophageal reflux disease without esophagitis: Secondary | ICD-10-CM | POA: Diagnosis not present

## 2024-01-06 DIAGNOSIS — L6612 Frontal fibrosing alopecia: Secondary | ICD-10-CM | POA: Diagnosis not present

## 2024-01-06 DIAGNOSIS — Z79899 Other long term (current) drug therapy: Secondary | ICD-10-CM | POA: Diagnosis not present

## 2024-01-17 DIAGNOSIS — R7301 Impaired fasting glucose: Secondary | ICD-10-CM | POA: Diagnosis not present

## 2024-01-28 DIAGNOSIS — H04123 Dry eye syndrome of bilateral lacrimal glands: Secondary | ICD-10-CM | POA: Diagnosis not present

## 2024-01-28 DIAGNOSIS — M3501 Sicca syndrome with keratoconjunctivitis: Secondary | ICD-10-CM | POA: Diagnosis not present

## 2024-01-31 DIAGNOSIS — Z6831 Body mass index (BMI) 31.0-31.9, adult: Secondary | ICD-10-CM | POA: Diagnosis not present

## 2024-01-31 DIAGNOSIS — H9202 Otalgia, left ear: Secondary | ICD-10-CM | POA: Diagnosis not present

## 2024-02-05 DIAGNOSIS — Z0189 Encounter for other specified special examinations: Secondary | ICD-10-CM | POA: Diagnosis not present

## 2024-02-05 DIAGNOSIS — M25552 Pain in left hip: Secondary | ICD-10-CM | POA: Diagnosis not present

## 2024-02-05 DIAGNOSIS — Z1231 Encounter for screening mammogram for malignant neoplasm of breast: Secondary | ICD-10-CM | POA: Diagnosis not present

## 2024-03-03 DIAGNOSIS — M1612 Unilateral primary osteoarthritis, left hip: Secondary | ICD-10-CM | POA: Diagnosis not present

## 2024-03-03 DIAGNOSIS — M25452 Effusion, left hip: Secondary | ICD-10-CM | POA: Diagnosis not present

## 2024-04-10 DIAGNOSIS — Z5189 Encounter for other specified aftercare: Secondary | ICD-10-CM | POA: Diagnosis not present

## 2024-05-11 DIAGNOSIS — H04123 Dry eye syndrome of bilateral lacrimal glands: Secondary | ICD-10-CM | POA: Diagnosis not present

## 2024-05-11 DIAGNOSIS — M3501 Sicca syndrome with keratoconjunctivitis: Secondary | ICD-10-CM | POA: Diagnosis not present

## 2024-05-27 DIAGNOSIS — M25552 Pain in left hip: Secondary | ICD-10-CM | POA: Diagnosis not present

## 2024-06-02 DIAGNOSIS — M25552 Pain in left hip: Secondary | ICD-10-CM | POA: Diagnosis not present

## 2024-06-09 DIAGNOSIS — M25552 Pain in left hip: Secondary | ICD-10-CM | POA: Diagnosis not present

## 2024-06-10 DIAGNOSIS — M797 Fibromyalgia: Secondary | ICD-10-CM | POA: Diagnosis not present

## 2024-06-10 DIAGNOSIS — F3340 Major depressive disorder, recurrent, in remission, unspecified: Secondary | ICD-10-CM | POA: Diagnosis not present

## 2024-06-10 DIAGNOSIS — M3509 Sicca syndrome with other organ involvement: Secondary | ICD-10-CM | POA: Diagnosis not present

## 2024-06-10 DIAGNOSIS — I2510 Atherosclerotic heart disease of native coronary artery without angina pectoris: Secondary | ICD-10-CM | POA: Diagnosis not present

## 2024-06-10 DIAGNOSIS — Z6831 Body mass index (BMI) 31.0-31.9, adult: Secondary | ICD-10-CM | POA: Diagnosis not present

## 2024-06-10 DIAGNOSIS — Z79899 Other long term (current) drug therapy: Secondary | ICD-10-CM | POA: Diagnosis not present

## 2024-06-10 DIAGNOSIS — Z0000 Encounter for general adult medical examination without abnormal findings: Secondary | ICD-10-CM | POA: Diagnosis not present

## 2024-06-10 DIAGNOSIS — K219 Gastro-esophageal reflux disease without esophagitis: Secondary | ICD-10-CM | POA: Diagnosis not present

## 2024-06-10 DIAGNOSIS — L6612 Frontal fibrosing alopecia: Secondary | ICD-10-CM | POA: Diagnosis not present

## 2024-06-10 DIAGNOSIS — L299 Pruritus, unspecified: Secondary | ICD-10-CM | POA: Diagnosis not present

## 2024-06-11 DIAGNOSIS — M25552 Pain in left hip: Secondary | ICD-10-CM | POA: Diagnosis not present

## 2024-06-16 DIAGNOSIS — M25552 Pain in left hip: Secondary | ICD-10-CM | POA: Diagnosis not present

## 2024-06-19 DIAGNOSIS — M25552 Pain in left hip: Secondary | ICD-10-CM | POA: Diagnosis not present

## 2024-07-09 DIAGNOSIS — Z5189 Encounter for other specified aftercare: Secondary | ICD-10-CM | POA: Diagnosis not present

## 2024-07-09 DIAGNOSIS — M1712 Unilateral primary osteoarthritis, left knee: Secondary | ICD-10-CM | POA: Diagnosis not present

## 2024-07-15 DIAGNOSIS — Z79899 Other long term (current) drug therapy: Secondary | ICD-10-CM | POA: Diagnosis not present

## 2024-07-16 DIAGNOSIS — M1712 Unilateral primary osteoarthritis, left knee: Secondary | ICD-10-CM | POA: Diagnosis not present

## 2024-07-23 DIAGNOSIS — M1712 Unilateral primary osteoarthritis, left knee: Secondary | ICD-10-CM | POA: Diagnosis not present

## 2024-07-27 DIAGNOSIS — Z79899 Other long term (current) drug therapy: Secondary | ICD-10-CM | POA: Diagnosis not present

## 2024-07-27 DIAGNOSIS — L6612 Frontal fibrosing alopecia: Secondary | ICD-10-CM | POA: Diagnosis not present

## 2024-07-28 DIAGNOSIS — M25511 Pain in right shoulder: Secondary | ICD-10-CM | POA: Diagnosis not present

## 2024-07-28 DIAGNOSIS — M19012 Primary osteoarthritis, left shoulder: Secondary | ICD-10-CM | POA: Diagnosis not present

## 2024-07-28 DIAGNOSIS — M25512 Pain in left shoulder: Secondary | ICD-10-CM | POA: Diagnosis not present

## 2024-08-04 DIAGNOSIS — M25511 Pain in right shoulder: Secondary | ICD-10-CM | POA: Diagnosis not present

## 2024-08-15 DIAGNOSIS — M25511 Pain in right shoulder: Secondary | ICD-10-CM | POA: Diagnosis not present

## 2024-08-25 DIAGNOSIS — M75101 Unspecified rotator cuff tear or rupture of right shoulder, not specified as traumatic: Secondary | ICD-10-CM | POA: Diagnosis not present

## 2024-08-25 DIAGNOSIS — M12811 Other specific arthropathies, not elsewhere classified, right shoulder: Secondary | ICD-10-CM | POA: Diagnosis not present

## 2024-08-25 DIAGNOSIS — M25511 Pain in right shoulder: Secondary | ICD-10-CM | POA: Diagnosis not present

## 2024-08-31 DIAGNOSIS — Z79899 Other long term (current) drug therapy: Secondary | ICD-10-CM | POA: Diagnosis not present

## 2024-08-31 DIAGNOSIS — Z130 Encounter for screening for diseases of the blood and blood-forming organs and certain disorders involving the immune mechanism: Secondary | ICD-10-CM | POA: Diagnosis not present

## 2024-09-03 DIAGNOSIS — M533 Sacrococcygeal disorders, not elsewhere classified: Secondary | ICD-10-CM | POA: Diagnosis not present

## 2024-09-03 DIAGNOSIS — M1712 Unilateral primary osteoarthritis, left knee: Secondary | ICD-10-CM | POA: Diagnosis not present

## 2024-09-09 DIAGNOSIS — M533 Sacrococcygeal disorders, not elsewhere classified: Secondary | ICD-10-CM | POA: Diagnosis not present

## 2024-09-15 DIAGNOSIS — H04123 Dry eye syndrome of bilateral lacrimal glands: Secondary | ICD-10-CM | POA: Diagnosis not present

## 2024-09-15 DIAGNOSIS — Z79899 Other long term (current) drug therapy: Secondary | ICD-10-CM | POA: Diagnosis not present

## 2024-09-20 DIAGNOSIS — M3500 Sicca syndrome, unspecified: Secondary | ICD-10-CM | POA: Diagnosis not present

## 2024-09-20 DIAGNOSIS — Z833 Family history of diabetes mellitus: Secondary | ICD-10-CM | POA: Diagnosis not present

## 2024-09-20 DIAGNOSIS — Z8249 Family history of ischemic heart disease and other diseases of the circulatory system: Secondary | ICD-10-CM | POA: Diagnosis not present

## 2024-09-20 DIAGNOSIS — I700 Atherosclerosis of aorta: Secondary | ICD-10-CM | POA: Diagnosis not present

## 2024-09-20 DIAGNOSIS — M810 Age-related osteoporosis without current pathological fracture: Secondary | ICD-10-CM | POA: Diagnosis not present

## 2024-09-20 DIAGNOSIS — M1990 Unspecified osteoarthritis, unspecified site: Secondary | ICD-10-CM | POA: Diagnosis not present

## 2024-09-20 DIAGNOSIS — E785 Hyperlipidemia, unspecified: Secondary | ICD-10-CM | POA: Diagnosis not present

## 2024-09-20 DIAGNOSIS — D84821 Immunodeficiency due to drugs: Secondary | ICD-10-CM | POA: Diagnosis not present

## 2024-09-20 DIAGNOSIS — I2510 Atherosclerotic heart disease of native coronary artery without angina pectoris: Secondary | ICD-10-CM | POA: Diagnosis not present

## 2024-10-06 DIAGNOSIS — B9689 Other specified bacterial agents as the cause of diseases classified elsewhere: Secondary | ICD-10-CM | POA: Diagnosis not present

## 2024-10-06 DIAGNOSIS — J208 Acute bronchitis due to other specified organisms: Secondary | ICD-10-CM | POA: Diagnosis not present

## 2024-10-07 DIAGNOSIS — M791 Myalgia, unspecified site: Secondary | ICD-10-CM | POA: Diagnosis not present
